# Patient Record
Sex: Female | Born: 1960 | Race: White | Hispanic: No | Smoking: Never smoker
Health system: Southern US, Community
[De-identification: ages and names within clinical notes are randomized; demographics above are authoritative.]

## PROBLEM LIST (undated history)

## (undated) DIAGNOSIS — F329 Major depressive disorder, single episode, unspecified: Secondary | ICD-10-CM

## (undated) DIAGNOSIS — D649 Anemia, unspecified: Secondary | ICD-10-CM

## (undated) DIAGNOSIS — T7840XA Allergy, unspecified, initial encounter: Secondary | ICD-10-CM

## (undated) DIAGNOSIS — C73 Malignant neoplasm of thyroid gland: Secondary | ICD-10-CM

## (undated) DIAGNOSIS — I872 Venous insufficiency (chronic) (peripheral): Secondary | ICD-10-CM

## (undated) DIAGNOSIS — K219 Gastro-esophageal reflux disease without esophagitis: Secondary | ICD-10-CM

## (undated) DIAGNOSIS — F419 Anxiety disorder, unspecified: Secondary | ICD-10-CM

## (undated) DIAGNOSIS — Z86718 Personal history of other venous thrombosis and embolism: Secondary | ICD-10-CM

## (undated) DIAGNOSIS — E039 Hypothyroidism, unspecified: Secondary | ICD-10-CM

## (undated) DIAGNOSIS — Z95828 Presence of other vascular implants and grafts: Secondary | ICD-10-CM

## (undated) DIAGNOSIS — Z8489 Family history of other specified conditions: Secondary | ICD-10-CM

## (undated) DIAGNOSIS — F32A Depression, unspecified: Secondary | ICD-10-CM

## (undated) HISTORY — DX: Allergy, unspecified, initial encounter: T78.40XA

## (undated) HISTORY — DX: Personal history of other venous thrombosis and embolism: Z86.718

## (undated) HISTORY — DX: Hypothyroidism, unspecified: E03.9

## (undated) HISTORY — DX: Presence of other vascular implants and grafts: Z95.828

## (undated) HISTORY — DX: Depression, unspecified: F32.A

## (undated) HISTORY — PX: ABDOMINAL HYSTERECTOMY: SHX81

## (undated) HISTORY — DX: Gastro-esophageal reflux disease without esophagitis: K21.9

## (undated) HISTORY — DX: Major depressive disorder, single episode, unspecified: F32.9

## (undated) HISTORY — DX: Venous insufficiency (chronic) (peripheral): I87.2

## (undated) HISTORY — PX: FETAL SURGERY FOR CONGENITAL HERNIA: SHX1618

## (undated) HISTORY — DX: Anxiety disorder, unspecified: F41.9

## (undated) HISTORY — DX: Anemia, unspecified: D64.9

## (undated) HISTORY — PX: GASTRIC BYPASS: SHX52

## (undated) HISTORY — PX: VEIN LIGATION: SHX2652

## (undated) HISTORY — DX: Malignant neoplasm of thyroid gland: C73

---
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
## 1980-12-21 HISTORY — PX: CHOLECYSTECTOMY: SHX55

## 1996-12-21 HISTORY — PX: TUBAL LIGATION: SHX77

## 2001-12-21 HISTORY — PX: BIOPSY THYROID: PRO38

## 2001-12-21 HISTORY — PX: DILATION AND CURETTAGE OF UTERUS: SHX78

## 2001-12-21 LAB — HM MAMMOGRAPHY: HM Mammogram: NORMAL

## 2002-12-21 ENCOUNTER — Encounter: Payer: Self-pay | Admitting: Internal Medicine

## 2002-12-21 LAB — CONVERTED CEMR LAB: Pap Smear: NORMAL

## 2005-09-08 ENCOUNTER — Ambulatory Visit: Payer: Self-pay | Admitting: Internal Medicine

## 2005-09-17 ENCOUNTER — Ambulatory Visit: Payer: Self-pay | Admitting: Internal Medicine

## 2005-09-17 ENCOUNTER — Ambulatory Visit: Payer: Self-pay | Admitting: *Deleted

## 2005-09-22 ENCOUNTER — Ambulatory Visit: Payer: Self-pay | Admitting: *Deleted

## 2005-10-06 ENCOUNTER — Ambulatory Visit: Payer: Self-pay | Admitting: Gastroenterology

## 2005-10-13 ENCOUNTER — Encounter (HOSPITAL_COMMUNITY): Admission: RE | Admit: 2005-10-13 | Discharge: 2005-12-18 | Payer: Self-pay | Admitting: Gastroenterology

## 2005-10-14 ENCOUNTER — Encounter (HOSPITAL_COMMUNITY): Admission: RE | Admit: 2005-10-14 | Discharge: 2006-01-12 | Payer: Self-pay | Admitting: Gastroenterology

## 2005-10-14 ENCOUNTER — Ambulatory Visit: Payer: Self-pay | Admitting: Gastroenterology

## 2005-10-19 ENCOUNTER — Ambulatory Visit: Payer: Self-pay | Admitting: Gastroenterology

## 2005-10-21 ENCOUNTER — Ambulatory Visit: Payer: Self-pay | Admitting: Gastroenterology

## 2005-10-21 ENCOUNTER — Encounter (INDEPENDENT_AMBULATORY_CARE_PROVIDER_SITE_OTHER): Payer: Self-pay | Admitting: Specialist

## 2005-10-21 DIAGNOSIS — D126 Benign neoplasm of colon, unspecified: Secondary | ICD-10-CM | POA: Insufficient documentation

## 2005-10-21 LAB — HM COLONOSCOPY

## 2005-10-28 ENCOUNTER — Ambulatory Visit: Admission: RE | Admit: 2005-10-28 | Discharge: 2005-10-28 | Payer: Self-pay | Admitting: Gynecology

## 2005-10-30 ENCOUNTER — Inpatient Hospital Stay (HOSPITAL_COMMUNITY): Admission: RE | Admit: 2005-10-30 | Discharge: 2005-11-01 | Payer: Self-pay | Admitting: Gynecology

## 2005-10-30 ENCOUNTER — Encounter (INDEPENDENT_AMBULATORY_CARE_PROVIDER_SITE_OTHER): Payer: Self-pay | Admitting: Specialist

## 2005-11-12 ENCOUNTER — Inpatient Hospital Stay (HOSPITAL_COMMUNITY): Admission: AD | Admit: 2005-11-12 | Discharge: 2005-11-14 | Payer: Self-pay | Admitting: Gynecology

## 2005-11-26 ENCOUNTER — Encounter: Admission: RE | Admit: 2005-11-26 | Discharge: 2005-11-26 | Payer: Self-pay | Admitting: General Surgery

## 2005-12-23 ENCOUNTER — Ambulatory Visit: Payer: Self-pay | Admitting: Internal Medicine

## 2006-01-04 ENCOUNTER — Ambulatory Visit (HOSPITAL_COMMUNITY): Admission: RE | Admit: 2006-01-04 | Discharge: 2006-01-05 | Payer: Self-pay | Admitting: General Surgery

## 2006-01-19 ENCOUNTER — Ambulatory Visit: Payer: Self-pay | Admitting: Internal Medicine

## 2006-02-17 ENCOUNTER — Ambulatory Visit: Payer: Self-pay | Admitting: Internal Medicine

## 2006-03-15 ENCOUNTER — Ambulatory Visit: Payer: Self-pay | Admitting: *Deleted

## 2006-03-17 ENCOUNTER — Ambulatory Visit: Payer: Self-pay | Admitting: *Deleted

## 2006-03-25 ENCOUNTER — Ambulatory Visit: Payer: Self-pay | Admitting: Cardiology

## 2006-03-29 ENCOUNTER — Ambulatory Visit: Payer: Self-pay

## 2006-04-13 ENCOUNTER — Ambulatory Visit: Payer: Self-pay | Admitting: Internal Medicine

## 2006-05-18 ENCOUNTER — Ambulatory Visit: Payer: Self-pay | Admitting: Internal Medicine

## 2006-05-20 ENCOUNTER — Ambulatory Visit: Payer: Self-pay | Admitting: Cardiology

## 2006-05-24 ENCOUNTER — Ambulatory Visit: Payer: Self-pay | Admitting: Gastroenterology

## 2006-06-02 ENCOUNTER — Ambulatory Visit: Payer: Self-pay | Admitting: Internal Medicine

## 2006-06-03 ENCOUNTER — Ambulatory Visit: Payer: Self-pay | Admitting: Cardiology

## 2006-06-10 ENCOUNTER — Ambulatory Visit: Payer: Self-pay | Admitting: Cardiology

## 2006-06-17 ENCOUNTER — Ambulatory Visit: Payer: Self-pay | Admitting: Cardiology

## 2006-06-17 ENCOUNTER — Ambulatory Visit: Payer: Self-pay | Admitting: Internal Medicine

## 2006-06-24 ENCOUNTER — Ambulatory Visit: Payer: Self-pay | Admitting: Cardiology

## 2006-06-30 ENCOUNTER — Ambulatory Visit: Payer: Self-pay | Admitting: *Deleted

## 2006-07-01 ENCOUNTER — Ambulatory Visit: Payer: Self-pay | Admitting: Cardiology

## 2006-07-08 ENCOUNTER — Ambulatory Visit: Payer: Self-pay | Admitting: Cardiology

## 2006-07-16 ENCOUNTER — Ambulatory Visit: Payer: Self-pay | Admitting: Cardiology

## 2006-07-16 ENCOUNTER — Ambulatory Visit: Payer: Self-pay | Admitting: Internal Medicine

## 2006-07-26 ENCOUNTER — Ambulatory Visit: Payer: Self-pay | Admitting: Cardiology

## 2006-08-02 ENCOUNTER — Ambulatory Visit: Payer: Self-pay | Admitting: Internal Medicine

## 2006-08-06 ENCOUNTER — Ambulatory Visit: Payer: Self-pay | Admitting: Cardiology

## 2006-08-13 ENCOUNTER — Ambulatory Visit: Payer: Self-pay | Admitting: Cardiovascular Disease

## 2006-08-16 ENCOUNTER — Ambulatory Visit: Payer: Self-pay | Admitting: Internal Medicine

## 2006-08-17 ENCOUNTER — Ambulatory Visit: Payer: Self-pay | Admitting: Cardiovascular Disease

## 2006-08-25 ENCOUNTER — Ambulatory Visit: Payer: Self-pay | Admitting: Internal Medicine

## 2006-08-27 ENCOUNTER — Ambulatory Visit: Payer: Self-pay | Admitting: Cardiology

## 2006-09-08 ENCOUNTER — Ambulatory Visit: Payer: Self-pay | Admitting: Internal Medicine

## 2006-09-10 ENCOUNTER — Ambulatory Visit: Payer: Self-pay | Admitting: Cardiology

## 2006-10-01 ENCOUNTER — Ambulatory Visit: Payer: Self-pay | Admitting: Cardiology

## 2006-10-04 ENCOUNTER — Ambulatory Visit: Payer: Self-pay | Admitting: Internal Medicine

## 2006-10-15 ENCOUNTER — Ambulatory Visit: Payer: Self-pay | Admitting: Internal Medicine

## 2006-11-05 ENCOUNTER — Ambulatory Visit: Payer: Self-pay | Admitting: Cardiovascular Disease

## 2006-12-03 ENCOUNTER — Ambulatory Visit: Payer: Self-pay | Admitting: Cardiology

## 2006-12-09 ENCOUNTER — Ambulatory Visit: Payer: Self-pay | Admitting: Internal Medicine

## 2006-12-21 HISTORY — PX: THYROIDECTOMY: SHX17

## 2006-12-23 ENCOUNTER — Ambulatory Visit: Payer: Self-pay | Admitting: Cardiology

## 2006-12-30 ENCOUNTER — Ambulatory Visit: Payer: Self-pay | Admitting: Internal Medicine

## 2007-01-13 ENCOUNTER — Ambulatory Visit: Payer: Self-pay | Admitting: Internal Medicine

## 2007-01-14 ENCOUNTER — Emergency Department (HOSPITAL_COMMUNITY): Admission: EM | Admit: 2007-01-14 | Discharge: 2007-01-14 | Payer: Self-pay | Admitting: Emergency Medicine

## 2007-01-19 ENCOUNTER — Ambulatory Visit: Payer: Self-pay | Admitting: Internal Medicine

## 2007-02-01 ENCOUNTER — Ambulatory Visit: Payer: Self-pay | Admitting: Cardiology

## 2007-02-16 ENCOUNTER — Ambulatory Visit: Payer: Self-pay | Admitting: Cardiology

## 2007-02-18 ENCOUNTER — Encounter: Payer: Self-pay | Admitting: Internal Medicine

## 2007-02-18 DIAGNOSIS — D649 Anemia, unspecified: Secondary | ICD-10-CM | POA: Insufficient documentation

## 2007-02-18 DIAGNOSIS — D509 Iron deficiency anemia, unspecified: Secondary | ICD-10-CM | POA: Insufficient documentation

## 2007-02-18 DIAGNOSIS — Z86718 Personal history of other venous thrombosis and embolism: Secondary | ICD-10-CM | POA: Insufficient documentation

## 2007-02-18 DIAGNOSIS — R32 Unspecified urinary incontinence: Secondary | ICD-10-CM | POA: Insufficient documentation

## 2007-02-18 DIAGNOSIS — G43909 Migraine, unspecified, not intractable, without status migrainosus: Secondary | ICD-10-CM | POA: Insufficient documentation

## 2007-02-18 DIAGNOSIS — K219 Gastro-esophageal reflux disease without esophagitis: Secondary | ICD-10-CM | POA: Insufficient documentation

## 2007-02-18 DIAGNOSIS — E039 Hypothyroidism, unspecified: Secondary | ICD-10-CM | POA: Insufficient documentation

## 2007-02-23 ENCOUNTER — Ambulatory Visit: Payer: Self-pay | Admitting: Internal Medicine

## 2007-03-02 ENCOUNTER — Ambulatory Visit: Payer: Self-pay | Admitting: Cardiology

## 2007-03-09 ENCOUNTER — Ambulatory Visit: Payer: Self-pay | Admitting: Internal Medicine

## 2007-03-16 ENCOUNTER — Ambulatory Visit: Payer: Self-pay | Admitting: Cardiology

## 2007-04-11 ENCOUNTER — Ambulatory Visit: Payer: Self-pay | Admitting: Internal Medicine

## 2007-04-11 LAB — CONVERTED CEMR LAB
Free T4: 0.5 ng/dL — ABNORMAL LOW (ref 0.6–1.6)
TSH: 7.67 microintl units/mL — ABNORMAL HIGH (ref 0.35–5.50)

## 2007-04-12 ENCOUNTER — Ambulatory Visit: Payer: Self-pay | Admitting: Cardiology

## 2007-05-02 ENCOUNTER — Other Ambulatory Visit: Admission: RE | Admit: 2007-05-02 | Discharge: 2007-05-02 | Payer: Self-pay | Admitting: Endocrinology

## 2007-05-02 ENCOUNTER — Ambulatory Visit: Payer: Self-pay | Admitting: Endocrinology

## 2007-05-18 ENCOUNTER — Other Ambulatory Visit: Admission: RE | Admit: 2007-05-18 | Discharge: 2007-05-18 | Payer: Self-pay | Admitting: Interventional Radiology

## 2007-05-18 ENCOUNTER — Encounter: Admission: RE | Admit: 2007-05-18 | Discharge: 2007-05-18 | Payer: Self-pay | Admitting: Endocrinology

## 2007-05-18 ENCOUNTER — Encounter (INDEPENDENT_AMBULATORY_CARE_PROVIDER_SITE_OTHER): Payer: Self-pay | Admitting: Interventional Radiology

## 2007-05-20 ENCOUNTER — Encounter (INDEPENDENT_AMBULATORY_CARE_PROVIDER_SITE_OTHER): Payer: Self-pay | Admitting: *Deleted

## 2007-05-27 ENCOUNTER — Ambulatory Visit: Payer: Self-pay | Admitting: Internal Medicine

## 2007-06-07 ENCOUNTER — Encounter: Payer: Self-pay | Admitting: Internal Medicine

## 2007-06-13 ENCOUNTER — Encounter: Admission: RE | Admit: 2007-06-13 | Discharge: 2007-06-13 | Payer: Self-pay | Admitting: Otolaryngology

## 2007-06-21 ENCOUNTER — Encounter (INDEPENDENT_AMBULATORY_CARE_PROVIDER_SITE_OTHER): Payer: Self-pay | Admitting: *Deleted

## 2007-06-27 ENCOUNTER — Ambulatory Visit: Payer: Self-pay | Admitting: Internal Medicine

## 2007-06-29 ENCOUNTER — Ambulatory Visit: Payer: Self-pay | Admitting: Cardiology

## 2007-07-04 ENCOUNTER — Ambulatory Visit: Payer: Self-pay | Admitting: Oncology

## 2007-07-18 ENCOUNTER — Ambulatory Visit: Payer: Self-pay | Admitting: Oncology

## 2007-07-19 ENCOUNTER — Encounter: Payer: Self-pay | Admitting: Internal Medicine

## 2007-07-19 LAB — CBC WITH DIFFERENTIAL (CANCER CENTER ONLY)
BASO#: 0 10*3/uL (ref 0.0–0.2)
EOS%: 2.4 % (ref 0.0–7.0)
HCT: 33.1 % — ABNORMAL LOW (ref 34.8–46.6)
HGB: 10.5 g/dL — ABNORMAL LOW (ref 11.6–15.9)
LYMPH#: 2 10*3/uL (ref 0.9–3.3)
MCHC: 31.7 g/dL — ABNORMAL LOW (ref 32.0–36.0)
MONO#: 0.3 10*3/uL (ref 0.1–0.9)
NEUT#: 3.1 10*3/uL (ref 1.5–6.5)
NEUT%: 56.2 % (ref 39.6–80.0)
RBC: 4.78 10*6/uL (ref 3.70–5.32)
WBC: 5.6 10*3/uL (ref 3.9–10.0)

## 2007-07-19 LAB — COMPREHENSIVE METABOLIC PANEL
ALT: 14 U/L (ref 0–35)
AST: 19 U/L (ref 0–37)
Albumin: 3.6 g/dL (ref 3.5–5.2)
CO2: 28 mEq/L (ref 19–32)
Calcium: 9.2 mg/dL (ref 8.4–10.5)
Chloride: 105 mEq/L (ref 96–112)
Creatinine, Ser: 0.57 mg/dL (ref 0.40–1.20)
Potassium: 4.1 mEq/L (ref 3.5–5.3)
Sodium: 140 mEq/L (ref 135–145)
Total Protein: 6.5 g/dL (ref 6.0–8.3)

## 2007-07-19 LAB — PROTIME-INR (CHCC SATELLITE): INR: 1.1 — ABNORMAL LOW (ref 2.0–3.5)

## 2007-07-25 LAB — HYPERCOAGULABLE PANEL, COMPREHENSIVE
AntiThromb III Func: 134 % — ABNORMAL HIGH (ref 76–126)
Anticardiolipin IgA: 13 [APL'U] (ref <13)
Beta-2-Glycoprotein I IgM: 4 U/mL (ref <10)
Homocysteine: 8.5 umol/L (ref 4.0–15.4)
Protein C, Total: 79 % (ref 70–140)
Protein S Activity: 89 % (ref 69–129)
Protein S Ag, Total: 109 % (ref 70–140)

## 2007-07-27 ENCOUNTER — Ambulatory Visit: Payer: Self-pay | Admitting: Internal Medicine

## 2007-08-04 ENCOUNTER — Ambulatory Visit: Payer: Self-pay | Admitting: Internal Medicine

## 2007-08-05 LAB — ANTITHROMBIN III: AntiThromb III Func: 121 % (ref 76–126)

## 2007-08-05 LAB — D-DIMER, QUANTITATIVE: D-Dimer, Quant: 0.43 ug/mL-FEU (ref 0.00–0.48)

## 2007-08-09 ENCOUNTER — Ambulatory Visit: Payer: Self-pay | Admitting: Cardiology

## 2007-08-16 ENCOUNTER — Encounter: Payer: Self-pay | Admitting: Internal Medicine

## 2007-08-17 ENCOUNTER — Encounter (INDEPENDENT_AMBULATORY_CARE_PROVIDER_SITE_OTHER): Payer: Self-pay | Admitting: Otolaryngology

## 2007-08-17 ENCOUNTER — Ambulatory Visit (HOSPITAL_COMMUNITY): Admission: RE | Admit: 2007-08-17 | Discharge: 2007-08-19 | Payer: Self-pay | Admitting: Otolaryngology

## 2007-08-17 ENCOUNTER — Ambulatory Visit: Payer: Self-pay | Admitting: Internal Medicine

## 2007-08-23 LAB — PROTIME-INR (CHCC SATELLITE): Protime: 18 Seconds — ABNORMAL HIGH (ref 10.6–13.4)

## 2007-08-24 ENCOUNTER — Encounter (INDEPENDENT_AMBULATORY_CARE_PROVIDER_SITE_OTHER): Payer: Self-pay | Admitting: *Deleted

## 2007-08-26 LAB — PROTIME-INR (CHCC SATELLITE)
INR: 2 (ref 2.0–3.5)
Protime: 24 Seconds — ABNORMAL HIGH (ref 10.6–13.4)

## 2007-08-30 LAB — PROTIME-INR (CHCC SATELLITE): Protime: 18 Seconds — ABNORMAL HIGH (ref 10.6–13.4)

## 2007-09-01 ENCOUNTER — Ambulatory Visit: Payer: Self-pay | Admitting: Oncology

## 2007-09-02 ENCOUNTER — Encounter: Payer: Self-pay | Admitting: Endocrinology

## 2007-09-02 DIAGNOSIS — F411 Generalized anxiety disorder: Secondary | ICD-10-CM | POA: Insufficient documentation

## 2007-09-02 DIAGNOSIS — F3289 Other specified depressive episodes: Secondary | ICD-10-CM | POA: Insufficient documentation

## 2007-09-02 DIAGNOSIS — J309 Allergic rhinitis, unspecified: Secondary | ICD-10-CM | POA: Insufficient documentation

## 2007-09-02 DIAGNOSIS — F329 Major depressive disorder, single episode, unspecified: Secondary | ICD-10-CM

## 2007-09-02 LAB — PROTIME-INR (CHCC SATELLITE): INR: 1.2 — ABNORMAL LOW (ref 2.0–3.5)

## 2007-09-05 ENCOUNTER — Ambulatory Visit: Payer: Self-pay | Admitting: Endocrinology

## 2007-09-05 ENCOUNTER — Encounter: Payer: Self-pay | Admitting: Endocrinology

## 2007-09-07 LAB — PROTIME-INR (CHCC SATELLITE): INR: 1.2 — ABNORMAL LOW (ref 2.0–3.5)

## 2007-09-09 ENCOUNTER — Ambulatory Visit: Payer: Self-pay | Admitting: Internal Medicine

## 2007-09-13 LAB — PROTIME-INR (CHCC SATELLITE)
INR: 2.6 (ref 2.0–3.5)
Protime: 31.2 Seconds — ABNORMAL HIGH (ref 10.6–13.4)

## 2007-09-20 LAB — PROTIME-INR (CHCC SATELLITE)
INR: 3.4 (ref 2.0–3.5)
Protime: 40.8 Seconds — ABNORMAL HIGH (ref 10.6–13.4)

## 2007-09-28 LAB — PROTIME-INR (CHCC SATELLITE): INR: 1.6 — ABNORMAL LOW (ref 2.0–3.5)

## 2007-10-03 ENCOUNTER — Ambulatory Visit: Payer: Self-pay | Admitting: Endocrinology

## 2007-10-03 LAB — CONVERTED CEMR LAB
Calcium, Total (PTH): 8.6 mg/dL (ref 8.4–10.5)
PTH: 44.9 pg/mL (ref 14.0–72.0)
TSH: 45.22 microintl units/mL — ABNORMAL HIGH (ref 0.35–5.50)

## 2007-10-04 ENCOUNTER — Encounter: Payer: Self-pay | Admitting: Internal Medicine

## 2007-10-05 LAB — PROTIME-INR (CHCC SATELLITE)

## 2007-10-09 ENCOUNTER — Encounter: Payer: Self-pay | Admitting: Endocrinology

## 2007-10-12 LAB — PROTHROMBIN TIME: Prothrombin Time: 14 seconds (ref 11.6–15.2)

## 2007-10-13 ENCOUNTER — Encounter (INDEPENDENT_AMBULATORY_CARE_PROVIDER_SITE_OTHER): Payer: Self-pay | Admitting: *Deleted

## 2007-10-13 ENCOUNTER — Ambulatory Visit: Payer: Self-pay | Admitting: Internal Medicine

## 2007-10-18 ENCOUNTER — Telehealth: Payer: Self-pay | Admitting: Endocrinology

## 2007-10-18 ENCOUNTER — Ambulatory Visit: Payer: Self-pay | Admitting: Oncology

## 2007-11-16 ENCOUNTER — Ambulatory Visit: Payer: Self-pay | Admitting: Endocrinology

## 2007-11-21 LAB — CONVERTED CEMR LAB: TSH: 2.94 microintl units/mL (ref 0.35–5.50)

## 2007-11-24 ENCOUNTER — Ambulatory Visit: Payer: Self-pay | Admitting: Internal Medicine

## 2007-11-29 ENCOUNTER — Telehealth (INDEPENDENT_AMBULATORY_CARE_PROVIDER_SITE_OTHER): Payer: Self-pay | Admitting: *Deleted

## 2007-11-30 ENCOUNTER — Encounter: Payer: Self-pay | Admitting: Internal Medicine

## 2007-11-30 LAB — CBC WITH DIFFERENTIAL (CANCER CENTER ONLY)
BASO#: 0.1 10*3/uL (ref 0.0–0.2)
Eosinophils Absolute: 0.3 10*3/uL (ref 0.0–0.5)
HCT: 29.5 % — ABNORMAL LOW (ref 34.8–46.6)
HGB: 9.5 g/dL — ABNORMAL LOW (ref 11.6–15.9)
LYMPH#: 2.2 10*3/uL (ref 0.9–3.3)
MCH: 22.6 pg — ABNORMAL LOW (ref 26.0–34.0)
MCHC: 32.1 g/dL (ref 32.0–36.0)
NEUT#: 4.5 10*3/uL (ref 1.5–6.5)
NEUT%: 60.6 % (ref 39.6–80.0)
RBC: 4.2 10*6/uL (ref 3.70–5.32)

## 2007-11-30 LAB — PROTIME-INR (CHCC SATELLITE)
INR: 4.2 — ABNORMAL HIGH (ref 2.0–3.5)
Protime: 50.4 Seconds — ABNORMAL HIGH (ref 10.6–13.4)

## 2007-12-01 LAB — IRON AND TIBC
%SAT: 5 % — ABNORMAL LOW (ref 20–55)
TIBC: 453 ug/dL (ref 250–470)

## 2007-12-01 LAB — FOLATE: Folate: 5.2 ng/mL

## 2007-12-01 LAB — VITAMIN B12: Vitamin B-12: 445 pg/mL (ref 211–911)

## 2007-12-05 ENCOUNTER — Ambulatory Visit: Payer: Self-pay | Admitting: Oncology

## 2007-12-06 ENCOUNTER — Ambulatory Visit: Payer: Self-pay | Admitting: Internal Medicine

## 2008-01-11 ENCOUNTER — Ambulatory Visit: Payer: Self-pay | Admitting: Internal Medicine

## 2008-01-16 ENCOUNTER — Ambulatory Visit: Payer: Self-pay | Admitting: Oncology

## 2008-02-13 ENCOUNTER — Encounter: Payer: Self-pay | Admitting: Internal Medicine

## 2008-02-13 LAB — CBC WITH DIFFERENTIAL (CANCER CENTER ONLY)
BASO%: 0.6 % (ref 0.0–2.0)
EOS%: 2.6 % (ref 0.0–7.0)
HCT: 38.3 % (ref 34.8–46.6)
LYMPH#: 2.4 10*3/uL (ref 0.9–3.3)
MCHC: 32.9 g/dL (ref 32.0–36.0)
MONO#: 0.3 10*3/uL (ref 0.1–0.9)
NEUT#: 3.3 10*3/uL (ref 1.5–6.5)
Platelets: 186 10*3/uL (ref 145–400)
RDW: 20.4 % — ABNORMAL HIGH (ref 10.5–14.6)
WBC: 6.2 10*3/uL (ref 3.9–10.0)

## 2008-02-13 LAB — IRON AND TIBC
%SAT: 13 % — ABNORMAL LOW (ref 20–55)
TIBC: 406 ug/dL (ref 250–470)

## 2008-02-13 LAB — RETICULOCYTES (CHCC): ABS Retic: 32 10*3/uL (ref 19.0–186.0)

## 2008-02-13 LAB — FERRITIN: Ferritin: 30 ng/mL (ref 10–291)

## 2008-02-15 ENCOUNTER — Ambulatory Visit: Payer: Self-pay | Admitting: Vascular Surgery

## 2008-02-15 ENCOUNTER — Ambulatory Visit (HOSPITAL_COMMUNITY): Admission: RE | Admit: 2008-02-15 | Discharge: 2008-02-15 | Payer: Self-pay | Admitting: Oncology

## 2008-02-15 ENCOUNTER — Encounter: Payer: Self-pay | Admitting: Oncology

## 2008-02-15 ENCOUNTER — Ambulatory Visit: Payer: Self-pay | Admitting: Internal Medicine

## 2008-03-22 ENCOUNTER — Ambulatory Visit: Payer: Self-pay | Admitting: Oncology

## 2008-03-27 ENCOUNTER — Ambulatory Visit: Payer: Self-pay | Admitting: Internal Medicine

## 2008-03-27 DIAGNOSIS — G479 Sleep disorder, unspecified: Secondary | ICD-10-CM | POA: Insufficient documentation

## 2008-03-27 LAB — CBC WITH DIFFERENTIAL (CANCER CENTER ONLY)
BASO%: 0.6 % (ref 0.0–2.0)
EOS%: 2.3 % (ref 0.0–7.0)
LYMPH%: 32.7 % (ref 14.0–48.0)
MCHC: 33.7 g/dL (ref 32.0–36.0)
MCV: 88 fL (ref 81–101)
MONO#: 0.3 10*3/uL (ref 0.1–0.9)
MONO%: 4.1 % (ref 0.0–13.0)
NEUT%: 60.3 % (ref 39.6–80.0)
Platelets: 175 10*3/uL (ref 145–400)
RDW: 15.1 % — ABNORMAL HIGH (ref 10.5–14.6)
WBC: 6.7 10*3/uL (ref 3.9–10.0)

## 2008-03-27 LAB — IRON AND TIBC
%SAT: 9 % — ABNORMAL LOW (ref 20–55)
Iron: 38 ug/dL — ABNORMAL LOW (ref 42–145)
TIBC: 404 ug/dL (ref 250–470)
UIBC: 366 ug/dL

## 2008-03-27 LAB — FERRITIN: Ferritin: 15 ng/mL (ref 10–291)

## 2008-06-15 ENCOUNTER — Ambulatory Visit: Payer: Self-pay | Admitting: Internal Medicine

## 2008-06-19 ENCOUNTER — Telehealth (INDEPENDENT_AMBULATORY_CARE_PROVIDER_SITE_OTHER): Payer: Self-pay | Admitting: *Deleted

## 2008-06-27 ENCOUNTER — Ambulatory Visit: Payer: Self-pay | Admitting: Gastroenterology

## 2008-07-10 ENCOUNTER — Ambulatory Visit (HOSPITAL_COMMUNITY): Admission: RE | Admit: 2008-07-10 | Discharge: 2008-07-10 | Payer: Self-pay | Admitting: Gastroenterology

## 2008-07-10 ENCOUNTER — Telehealth (INDEPENDENT_AMBULATORY_CARE_PROVIDER_SITE_OTHER): Payer: Self-pay | Admitting: *Deleted

## 2008-07-17 ENCOUNTER — Encounter (INDEPENDENT_AMBULATORY_CARE_PROVIDER_SITE_OTHER): Payer: Self-pay | Admitting: *Deleted

## 2008-07-18 ENCOUNTER — Ambulatory Visit: Payer: Self-pay | Admitting: Oncology

## 2008-07-18 ENCOUNTER — Telehealth: Payer: Self-pay | Admitting: Gastroenterology

## 2008-07-23 ENCOUNTER — Telehealth (INDEPENDENT_AMBULATORY_CARE_PROVIDER_SITE_OTHER): Payer: Self-pay | Admitting: *Deleted

## 2008-07-23 ENCOUNTER — Encounter: Payer: Self-pay | Admitting: Internal Medicine

## 2008-07-23 LAB — CBC WITH DIFFERENTIAL (CANCER CENTER ONLY)
Eosinophils Absolute: 0.1 10*3/uL (ref 0.0–0.5)
HCT: 40.7 % (ref 34.8–46.6)
LYMPH%: 38.4 % (ref 14.0–48.0)
MCH: 31.4 pg (ref 26.0–34.0)
MCV: 92 fL (ref 81–101)
MONO%: 4.5 % (ref 0.0–13.0)
NEUT%: 54.5 % (ref 39.6–80.0)
Platelets: 180 10*3/uL (ref 145–400)
RBC: 4.42 10*6/uL (ref 3.70–5.32)
RDW: 12.7 % (ref 10.5–14.6)

## 2008-07-23 LAB — FERRITIN: Ferritin: 37 ng/mL (ref 10–291)

## 2008-07-26 ENCOUNTER — Telehealth: Payer: Self-pay | Admitting: Gastroenterology

## 2008-08-06 ENCOUNTER — Ambulatory Visit: Payer: Self-pay | Admitting: Gastroenterology

## 2008-08-14 ENCOUNTER — Ambulatory Visit: Payer: Self-pay | Admitting: Gastroenterology

## 2008-10-10 ENCOUNTER — Ambulatory Visit: Payer: Self-pay | Admitting: Internal Medicine

## 2008-10-22 ENCOUNTER — Ambulatory Visit: Payer: Self-pay | Admitting: Oncology

## 2008-10-23 ENCOUNTER — Encounter (INDEPENDENT_AMBULATORY_CARE_PROVIDER_SITE_OTHER): Payer: Self-pay | Admitting: Orthopedic Surgery

## 2008-10-23 ENCOUNTER — Ambulatory Visit: Admission: RE | Admit: 2008-10-23 | Discharge: 2008-10-23 | Payer: Self-pay | Admitting: Orthopedic Surgery

## 2008-10-23 ENCOUNTER — Ambulatory Visit: Payer: Self-pay | Admitting: Surgery

## 2008-10-23 ENCOUNTER — Encounter: Payer: Self-pay | Admitting: Internal Medicine

## 2008-10-23 LAB — CBC WITH DIFFERENTIAL (CANCER CENTER ONLY)
BASO#: 0 10*3/uL (ref 0.0–0.2)
Eosinophils Absolute: 0.1 10*3/uL (ref 0.0–0.5)
HCT: 39.6 % (ref 34.8–46.6)
HGB: 13.5 g/dL (ref 11.6–15.9)
LYMPH%: 9.1 % — ABNORMAL LOW (ref 14.0–48.0)
MCH: 32.3 pg (ref 26.0–34.0)
MCV: 94 fL (ref 81–101)
MONO%: 0.9 % (ref 0.0–13.0)
RBC: 4.19 10*6/uL (ref 3.70–5.32)

## 2008-10-23 LAB — FERRITIN: Ferritin: 27 ng/mL (ref 10–291)

## 2008-10-23 LAB — IRON AND TIBC
Iron: 91 ug/dL (ref 42–145)
TIBC: 371 ug/dL (ref 250–470)
UIBC: 280 ug/dL

## 2008-10-29 ENCOUNTER — Encounter: Payer: Self-pay | Admitting: Internal Medicine

## 2009-01-10 ENCOUNTER — Ambulatory Visit: Payer: Self-pay | Admitting: Internal Medicine

## 2009-01-28 ENCOUNTER — Ambulatory Visit: Payer: Self-pay | Admitting: Oncology

## 2009-01-29 ENCOUNTER — Encounter: Payer: Self-pay | Admitting: Internal Medicine

## 2009-02-20 ENCOUNTER — Ambulatory Visit: Payer: Self-pay | Admitting: Internal Medicine

## 2009-03-08 ENCOUNTER — Ambulatory Visit: Payer: Self-pay | Admitting: Family Medicine

## 2009-03-29 ENCOUNTER — Telehealth: Payer: Self-pay | Admitting: Family Medicine

## 2009-05-22 ENCOUNTER — Ambulatory Visit: Payer: Self-pay | Admitting: Oncology

## 2009-06-12 ENCOUNTER — Ambulatory Visit: Payer: Self-pay | Admitting: Internal Medicine

## 2009-06-12 ENCOUNTER — Telehealth: Payer: Self-pay | Admitting: Internal Medicine

## 2009-07-08 ENCOUNTER — Ambulatory Visit: Payer: Self-pay | Admitting: Oncology

## 2009-07-10 ENCOUNTER — Encounter: Payer: Self-pay | Admitting: Internal Medicine

## 2009-07-10 LAB — IRON AND TIBC
%SAT: 16 % — ABNORMAL LOW (ref 20–55)
TIBC: 363 ug/dL (ref 250–470)

## 2009-07-10 LAB — CBC WITH DIFFERENTIAL (CANCER CENTER ONLY)
BASO#: 0 10*3/uL (ref 0.0–0.2)
Eosinophils Absolute: 0.2 10*3/uL (ref 0.0–0.5)
HGB: 12.5 g/dL (ref 11.6–15.9)
LYMPH#: 2.4 10*3/uL (ref 0.9–3.3)
MCH: 30.8 pg (ref 26.0–34.0)
MONO#: 0.4 10*3/uL (ref 0.1–0.9)
NEUT#: 2.7 10*3/uL (ref 1.5–6.5)
RBC: 4.07 10*6/uL (ref 3.70–5.32)
WBC: 5.6 10*3/uL (ref 3.9–10.0)

## 2009-07-10 LAB — FERRITIN: Ferritin: 8 ng/mL — ABNORMAL LOW (ref 10–291)

## 2009-07-12 ENCOUNTER — Ambulatory Visit: Payer: Self-pay | Admitting: Internal Medicine

## 2009-08-07 ENCOUNTER — Ambulatory Visit: Payer: Self-pay | Admitting: Oncology

## 2009-08-27 ENCOUNTER — Ambulatory Visit: Payer: Self-pay | Admitting: Internal Medicine

## 2009-08-27 DIAGNOSIS — I872 Venous insufficiency (chronic) (peripheral): Secondary | ICD-10-CM | POA: Insufficient documentation

## 2009-08-28 LAB — CONVERTED CEMR LAB
ALT: 23 units/L (ref 0–35)
AST: 23 units/L (ref 0–37)
Albumin: 3.9 g/dL (ref 3.5–5.2)
Alkaline Phosphatase: 94 units/L (ref 39–117)
BUN: 6 mg/dL (ref 6–23)
Bilirubin, Direct: 0.2 mg/dL (ref 0.0–0.3)
CO2: 29 meq/L (ref 19–32)
Calcium: 8.7 mg/dL (ref 8.4–10.5)
Chloride: 106 meq/L (ref 96–112)
Creatinine, Ser: 0.5 mg/dL (ref 0.4–1.2)
Free T4: 1.3 ng/dL (ref 0.6–1.6)
GFR calc non Af Amer: 139.8 mL/min (ref >60)
Glucose, Bld: 85 mg/dL (ref 70–99)
Potassium: 3.6 meq/L (ref 3.5–5.1)
Sodium: 144 meq/L (ref 135–145)
TSH: 0.04 microintl units/mL — ABNORMAL LOW (ref 0.35–5.50)
Total Bilirubin: 1.5 mg/dL — ABNORMAL HIGH (ref 0.3–1.2)
Total Protein: 6.6 g/dL (ref 6.0–8.3)

## 2009-09-04 ENCOUNTER — Ambulatory Visit: Payer: Self-pay | Admitting: Oncology

## 2009-09-24 ENCOUNTER — Emergency Department (HOSPITAL_COMMUNITY): Admission: EM | Admit: 2009-09-24 | Discharge: 2009-09-24 | Payer: Self-pay | Admitting: Emergency Medicine

## 2009-09-24 ENCOUNTER — Emergency Department (HOSPITAL_COMMUNITY): Admission: EM | Admit: 2009-09-24 | Discharge: 2009-09-24 | Payer: Self-pay | Admitting: Family Medicine

## 2009-09-24 ENCOUNTER — Encounter: Payer: Self-pay | Admitting: Internal Medicine

## 2009-09-25 ENCOUNTER — Telehealth: Payer: Self-pay | Admitting: Internal Medicine

## 2009-09-25 DIAGNOSIS — Z8669 Personal history of other diseases of the nervous system and sense organs: Secondary | ICD-10-CM | POA: Insufficient documentation

## 2009-09-25 DIAGNOSIS — Q054 Unspecified spina bifida with hydrocephalus: Secondary | ICD-10-CM

## 2009-10-02 ENCOUNTER — Encounter: Admission: RE | Admit: 2009-10-02 | Discharge: 2009-10-02 | Payer: Self-pay | Admitting: Internal Medicine

## 2009-10-11 ENCOUNTER — Encounter: Payer: Self-pay | Admitting: Internal Medicine

## 2009-10-30 ENCOUNTER — Ambulatory Visit: Payer: Self-pay | Admitting: Oncology

## 2009-11-04 ENCOUNTER — Encounter: Payer: Self-pay | Admitting: Internal Medicine

## 2009-11-04 LAB — CBC WITH DIFFERENTIAL (CANCER CENTER ONLY)
BASO#: 0 10*3/uL (ref 0.0–0.2)
BASO%: 0.6 % (ref 0.0–2.0)
EOS%: 2.3 % (ref 0.0–7.0)
Eosinophils Absolute: 0.2 10*3/uL (ref 0.0–0.5)
HCT: 39.3 % (ref 34.8–46.6)
HGB: 13.1 g/dL (ref 11.6–15.9)
LYMPH#: 2.5 10*3/uL (ref 0.9–3.3)
LYMPH%: 36.1 % (ref 14.0–48.0)
MCH: 32 pg (ref 26.0–34.0)
MCHC: 33.3 g/dL (ref 32.0–36.0)
MCV: 96 fL (ref 81–101)
MONO#: 0.3 10*3/uL (ref 0.1–0.9)
MONO%: 4 % (ref 0.0–13.0)
NEUT#: 4 10*3/uL (ref 1.5–6.5)
NEUT%: 57 % (ref 39.6–80.0)
Platelets: 161 10*3/uL (ref 145–400)
RBC: 4.08 10*6/uL (ref 3.70–5.32)
RDW: 11.8 % (ref 10.5–14.6)
WBC: 6.9 10*3/uL (ref 3.9–10.0)

## 2009-11-05 LAB — IRON AND TIBC
%SAT: 20 % (ref 20–55)
Iron: 68 ug/dL (ref 42–145)
TIBC: 342 ug/dL (ref 250–470)

## 2009-11-13 ENCOUNTER — Ambulatory Visit: Payer: Self-pay | Admitting: Cardiovascular Disease

## 2009-12-04 ENCOUNTER — Ambulatory Visit (HOSPITAL_COMMUNITY): Admission: RE | Admit: 2009-12-04 | Discharge: 2009-12-04 | Payer: Self-pay | Admitting: Obstetrics

## 2009-12-04 ENCOUNTER — Encounter: Payer: Self-pay | Admitting: Cardiovascular Disease

## 2009-12-04 ENCOUNTER — Ambulatory Visit: Payer: Self-pay | Admitting: Internal Medicine

## 2009-12-04 ENCOUNTER — Ambulatory Visit: Payer: Self-pay

## 2009-12-12 ENCOUNTER — Telehealth: Payer: Self-pay | Admitting: Cardiovascular Disease

## 2009-12-19 ENCOUNTER — Ambulatory Visit: Payer: Self-pay | Admitting: Family Medicine

## 2010-01-21 ENCOUNTER — Ambulatory Visit: Payer: Self-pay | Admitting: Internal Medicine

## 2010-01-23 ENCOUNTER — Encounter (INDEPENDENT_AMBULATORY_CARE_PROVIDER_SITE_OTHER): Payer: Self-pay | Admitting: *Deleted

## 2010-01-31 ENCOUNTER — Ambulatory Visit: Payer: Self-pay | Admitting: Internal Medicine

## 2010-02-11 ENCOUNTER — Encounter: Payer: Self-pay | Admitting: Internal Medicine

## 2010-02-28 ENCOUNTER — Ambulatory Visit: Payer: Self-pay | Admitting: Oncology

## 2010-03-04 ENCOUNTER — Encounter: Payer: Self-pay | Admitting: Internal Medicine

## 2010-03-04 LAB — IRON AND TIBC
Iron: 101 ug/dL (ref 42–145)
UIBC: 257 ug/dL

## 2010-03-04 LAB — CBC WITH DIFFERENTIAL (CANCER CENTER ONLY)
BASO%: 0.7 % (ref 0.0–2.0)
EOS%: 2.1 % (ref 0.0–7.0)
MCH: 32.5 pg (ref 26.0–34.0)
MCHC: 34 g/dL (ref 32.0–36.0)
MONO%: 4 % (ref 0.0–13.0)
NEUT#: 4.5 10*3/uL (ref 1.5–6.5)
Platelets: 199 10*3/uL (ref 145–400)
RDW: 12.4 % (ref 10.5–14.6)

## 2010-03-27 ENCOUNTER — Encounter: Payer: Self-pay | Admitting: Internal Medicine

## 2010-03-27 ENCOUNTER — Ambulatory Visit: Payer: Self-pay | Admitting: Vascular Surgery

## 2010-03-27 ENCOUNTER — Ambulatory Visit (HOSPITAL_COMMUNITY): Admission: RE | Admit: 2010-03-27 | Discharge: 2010-03-27 | Payer: Self-pay | Admitting: Internal Medicine

## 2010-05-21 HISTORY — PX: CRANIECTOMY SUBOCCIPITAL W/ CERVICAL LAMINECTOMY / CHIARI: SUR327

## 2010-05-26 ENCOUNTER — Ambulatory Visit: Payer: Self-pay | Admitting: Oncology

## 2010-05-29 ENCOUNTER — Ambulatory Visit: Payer: Self-pay | Admitting: Internal Medicine

## 2010-05-29 LAB — CBC WITH DIFFERENTIAL (CANCER CENTER ONLY)
BASO%: 0.3 % (ref 0.0–2.0)
EOS%: 2.4 % (ref 0.0–7.0)
LYMPH#: 2 10*3/uL (ref 0.9–3.3)
MCHC: 34.2 g/dL (ref 32.0–36.0)
NEUT#: 3.3 10*3/uL (ref 1.5–6.5)
Platelets: 182 10*3/uL (ref 145–400)
RDW: 11 % (ref 10.5–14.6)

## 2010-06-02 ENCOUNTER — Inpatient Hospital Stay (HOSPITAL_COMMUNITY): Admission: RE | Admit: 2010-06-02 | Discharge: 2010-06-06 | Payer: Self-pay | Admitting: Neurosurgery

## 2010-06-16 ENCOUNTER — Encounter: Payer: Self-pay | Admitting: Internal Medicine

## 2010-06-16 LAB — CBC WITH DIFFERENTIAL (CANCER CENTER ONLY)
BASO#: 0 10*3/uL (ref 0.0–0.2)
EOS%: 5.1 % (ref 0.0–7.0)
Eosinophils Absolute: 0.3 10*3/uL (ref 0.0–0.5)
HGB: 12.7 g/dL (ref 11.6–15.9)
LYMPH#: 1.6 10*3/uL (ref 0.9–3.3)
NEUT#: 3.1 10*3/uL (ref 1.5–6.5)
RBC: 3.86 10*6/uL (ref 3.70–5.32)

## 2010-06-16 LAB — IRON AND TIBC
%SAT: 25 % (ref 20–55)
Iron: 78 ug/dL (ref 42–145)
UIBC: 231 ug/dL

## 2010-06-16 LAB — FERRITIN: Ferritin: 71 ng/mL (ref 10–291)

## 2010-07-09 ENCOUNTER — Ambulatory Visit: Payer: Self-pay | Admitting: Internal Medicine

## 2010-07-11 ENCOUNTER — Ambulatory Visit: Payer: Self-pay | Admitting: Oncology

## 2010-07-25 ENCOUNTER — Encounter: Payer: Self-pay | Admitting: Internal Medicine

## 2010-07-25 LAB — CBC WITH DIFFERENTIAL/PLATELET
Basophils Absolute: 0 10*3/uL (ref 0.0–0.1)
EOS%: 2.6 % (ref 0.0–7.0)
Eosinophils Absolute: 0.1 10*3/uL (ref 0.0–0.5)
HGB: 12.2 g/dL (ref 11.6–15.9)
MONO#: 0.4 10*3/uL (ref 0.1–0.9)
NEUT#: 2.4 10*3/uL (ref 1.5–6.5)
RDW: 13.8 % (ref 11.2–14.5)
WBC: 4.8 10*3/uL (ref 3.9–10.3)
lymph#: 1.9 10*3/uL (ref 0.9–3.3)

## 2010-07-28 ENCOUNTER — Encounter: Payer: Self-pay | Admitting: Oncology

## 2010-07-28 ENCOUNTER — Ambulatory Visit: Payer: Self-pay | Admitting: Vascular Surgery

## 2010-07-28 ENCOUNTER — Ambulatory Visit: Admission: RE | Admit: 2010-07-28 | Discharge: 2010-07-28 | Payer: Self-pay | Admitting: Oncology

## 2010-08-04 ENCOUNTER — Ambulatory Visit (HOSPITAL_COMMUNITY): Admission: RE | Admit: 2010-08-04 | Discharge: 2010-08-04 | Payer: Self-pay | Admitting: Neurosurgery

## 2010-08-11 ENCOUNTER — Inpatient Hospital Stay (HOSPITAL_COMMUNITY): Admission: RE | Admit: 2010-08-11 | Discharge: 2010-08-17 | Payer: Self-pay | Admitting: Neurosurgery

## 2010-08-14 ENCOUNTER — Ambulatory Visit: Payer: Self-pay | Admitting: Oncology

## 2010-08-20 ENCOUNTER — Encounter: Payer: Self-pay | Admitting: Internal Medicine

## 2010-08-20 LAB — CBC WITH DIFFERENTIAL (CANCER CENTER ONLY)
BASO#: 0.1 10*3/uL (ref 0.0–0.2)
Eosinophils Absolute: 0.2 10*3/uL (ref 0.0–0.5)
HGB: 13.6 g/dL (ref 11.6–15.9)
MCH: 32.7 pg (ref 26.0–34.0)
MCV: 96 fL (ref 81–101)
MONO%: 7.1 % (ref 0.0–13.0)
NEUT#: 6 10*3/uL (ref 1.5–6.5)
RBC: 4.16 10*6/uL (ref 3.70–5.32)

## 2010-08-20 LAB — CMP (CANCER CENTER ONLY)
Albumin: 3.7 g/dL (ref 3.3–5.5)
Alkaline Phosphatase: 68 U/L (ref 26–84)
BUN, Bld: 9 mg/dL (ref 7–22)
Calcium: 8.7 mg/dL (ref 8.0–10.3)
Chloride: 97 mEq/L — ABNORMAL LOW (ref 98–108)
Glucose, Bld: 87 mg/dL (ref 73–118)
Potassium: 3.9 mEq/L (ref 3.3–4.7)

## 2010-08-20 LAB — PROTIME-INR (CHCC SATELLITE)
INR: 0.9 — ABNORMAL LOW (ref 2.0–3.5)
Protime: 10.8 Seconds (ref 10.6–13.4)

## 2010-09-11 ENCOUNTER — Encounter: Payer: Self-pay | Admitting: Internal Medicine

## 2010-10-02 ENCOUNTER — Telehealth: Payer: Self-pay | Admitting: Internal Medicine

## 2010-10-03 ENCOUNTER — Ambulatory Visit: Payer: Self-pay | Admitting: Internal Medicine

## 2010-10-17 ENCOUNTER — Telehealth: Payer: Self-pay | Admitting: Internal Medicine

## 2010-11-27 ENCOUNTER — Ambulatory Visit: Payer: Self-pay | Admitting: Oncology

## 2010-12-01 LAB — IRON AND TIBC: %SAT: 13 % — ABNORMAL LOW (ref 20–55)

## 2010-12-01 LAB — CBC WITH DIFFERENTIAL/PLATELET
BASO%: 0.4 % (ref 0.0–2.0)
HCT: 37.7 % (ref 34.8–46.6)
LYMPH%: 33.5 % (ref 14.0–49.7)
MCHC: 34.1 g/dL (ref 31.5–36.0)
MONO#: 0.3 10*3/uL (ref 0.1–0.9)
NEUT%: 58.4 % (ref 38.4–76.8)
Platelets: 199 10*3/uL (ref 145–400)
WBC: 6.1 10*3/uL (ref 3.9–10.3)

## 2010-12-01 LAB — FERRITIN: Ferritin: 30 ng/mL (ref 10–291)

## 2010-12-05 ENCOUNTER — Ambulatory Visit: Payer: Self-pay | Admitting: Internal Medicine

## 2010-12-22 ENCOUNTER — Emergency Department (HOSPITAL_COMMUNITY)
Admission: EM | Admit: 2010-12-22 | Discharge: 2010-12-22 | Payer: Self-pay | Source: Home / Self Care | Admitting: Family Medicine

## 2010-12-23 ENCOUNTER — Encounter (INDEPENDENT_AMBULATORY_CARE_PROVIDER_SITE_OTHER): Payer: Self-pay | Admitting: *Deleted

## 2010-12-23 ENCOUNTER — Telehealth: Payer: Self-pay | Admitting: Family Medicine

## 2010-12-24 ENCOUNTER — Encounter: Payer: Self-pay | Admitting: Family Medicine

## 2010-12-24 ENCOUNTER — Ambulatory Visit
Admission: RE | Admit: 2010-12-24 | Discharge: 2010-12-24 | Payer: Self-pay | Source: Home / Self Care | Attending: Family Medicine | Admitting: Family Medicine

## 2011-01-01 ENCOUNTER — Emergency Department (HOSPITAL_COMMUNITY)
Admission: EM | Admit: 2011-01-01 | Discharge: 2011-01-01 | Payer: Self-pay | Source: Home / Self Care | Admitting: Family Medicine

## 2011-01-06 ENCOUNTER — Ambulatory Visit
Admission: RE | Admit: 2011-01-06 | Discharge: 2011-01-06 | Payer: Self-pay | Source: Home / Self Care | Attending: Internal Medicine | Admitting: Internal Medicine

## 2011-01-06 ENCOUNTER — Ambulatory Visit: Payer: Self-pay | Admitting: Oncology

## 2011-01-11 ENCOUNTER — Encounter: Payer: Self-pay | Admitting: Endocrinology

## 2011-01-12 ENCOUNTER — Encounter: Payer: Self-pay | Admitting: Gastroenterology

## 2011-01-12 ENCOUNTER — Encounter: Payer: Self-pay | Admitting: Internal Medicine

## 2011-01-20 NOTE — Letter (Signed)
Summary: Appointment - Missed  [facility], Main Office  [address]   [place], [place] [address]   Phone: [phone]  Fax: [phone]     January 23, 2010 MRN: [id number]   [name] [address] [place], [place]  [address]   Dear Ms. [name],  Our records indicate you missed your appointment on December 25, 2009 with Dr. Clifton James. It is very important that we reach you to reschedule this appointment. We look forward to participating in your health care needs. Please contact us at the number listed above at your earliest convenience to reschedule this appointment.     Sincerely, Neurosurgeon Team LG

## 2011-01-20 NOTE — Consult Note (Signed)
Summary: [facility]  [facility]   Imported By: Lanelle Bal 03/26/2010 11:36:28  _____________________________________________________________________  External Attachment:    Type:   Image     Comment:   External Document  Appended Document: [facility] bad allergies considering immunotherapy may have latex and hymenoptera sensitivity also

## 2011-01-20 NOTE — Letter (Signed)
[facility]   Imported By: Lester Ettrick 06/07/2010 08:26:03  _____________________________________________________________________  External Attachment:    Type:   Image     Comment:   External Document  Appended Document: [facility] preop eval for Chiari malformation repair recommended sequential compression hose, then lovenox and coumadin for 1-3 months post op

## 2011-01-20 NOTE — Letter (Signed)
[facility]   Imported By: Maryln Gottron 08/05/2010 12:21:47  _____________________________________________________________________  External Attachment:    Type:   Image     Comment:   External Document  Appended Document: [facility] HGB normal no iron needed  off lovenox post op mild calf swelling---checking duplex

## 2011-01-20 NOTE — Progress Notes (Signed)
Summary: requests zoloft  Phone Note Call from Patient Call back at Home Phone [phone]   Caller: Patient Call For:  R Summary of Call: Pt states she was on zoloft a few years ago and recently started taking this again.  She says she has been under a lot of stress and is asking if a new script can be called in for [facility] to [facility].  She was on 100 mg's back then but says she doesnt think she needs that high of a dose. Initial call taken by: Lowella Petties CMA, AAMA,  October 17, 2010 8:27 AM  Follow-up for Phone Call        okay to send Rx for sertraline 100mg   1/2 daily and increase as directed #30 x 1  have her set up appt in about 3 weeks to see how she is doing back on this Follow-up by: Cindee Salt MD,  October 17, 2010 8:54 AM  Additional Follow-up for Phone Call Additional follow up Details #1::        [facility], patient will call back to set up appt. Additional Follow-up by: Mervin Hack CMA Duncan Dull),  October 17, 2010 12:04 PM    New/Updated Medications: SERTRALINE HCL 100 MG TABS (SERTRALINE HCL) 1/2 daily and increase as directed Prescriptions: SERTRALINE HCL 100 MG TABS (SERTRALINE HCL) 1/2 daily and increase as directed  #30 x 1   Entered by:   Mervin Hack CMA (AAMA)   Authorized by:   Cindee Salt MD   Signed by:   Mervin Hack CMA (AAMA) on 10/17/2010   Method used:   Telephoned to ...       [facility] [facility]ail)       [address]       [place], [place]  [address]       Ph: [phone] or [phone]       Fax: [phone]   RxID:   [id number]

## 2011-01-20 NOTE — Letter (Signed)
[facility]   Imported By: Lanelle Bal 07/02/2010 12:55:57  _____________________________________________________________________  External Attachment:    Type:   Image     Comment:   External Document  Appended Document: [facility] lovenox for 1 month postop then switching to coumadin Chiari malformation repaired

## 2011-01-20 NOTE — Assessment & Plan Note (Signed)
Summary: RASH x1week/ds   Vital Signs:  Patient profile:   50 year old female Weight:      185 pounds BMI:     32.89 Temp:     98.3 degrees F oral BP sitting:   112 / 68  (left arm) Cuff size:   large  Vitals Entered By: Mervin Hack CMA Duncan Dull) (October 03, 2010 3:41 PM) CC: rash   History of Present Illness:  Concerned about her left great toe No obvious trauma that she remembers started 1-2 weeks ago now purplish under the nail tender in toe but not overly painful some discomfort when "confined" in shoe  also has noted irritated area with bleeding in groin--suprapubic has sig pannus from her past gastric bypass and weight loss tried desitin and powder feminine wash also    Allergies: 1)  ! Ambien (Zolpidem Tartrate) 2)  ! * Latex 3)  Rozerem (Ramelteon) 4)  * Tetracycline Analogues Group 5)  Temazepam (Temazepam)  Past History:  Past medical, surgical, family and social histories (including risk factors) reviewed for relevance to current acute and chronic problems.  Past Medical History: Reviewed history from 01/31/2010 and no changes required. Anemia-------------------------------------------------------Dr Cherene Altes DVT, hx of GERD----------------------------------------------------------Dr Christella Hartigan Hypothyroidism Allergic rhinitis Anxiety Depression Thyroid nodules--path okay at surgery Chronic venous insufficiency  Past Surgical History: Caesarean section--1998 Cholecystectomy--1982 Hysterectomy Tubal ligation--1998 Tonsillectomy--1965 [place] filter--7/04 Vein ligation R leg--1995/1998 D&C--2003 thyroid biopsy-1/03---suggestive of Hashimoto's Gastric bypass (L) Thyroidectomy--2008 Hernia Surgery 6/11 Repair of Chiari malformation--Dr [facility] 8/11 repair of myelocele but Dr Jordan Likes  Family History: Reviewed history from 11/13/2009 and no changes required. Family History of Arthritis--Mom Family History of CAD Female 1st degree relative  <50---Sister, Dad Family History Diabetes 1st degree relative Family History Ovarian cancer--Mat GM Family History Hypertension--multiple  Father-alive, CAD Mother-alive Siblings-alive, healthy  Social History: Reviewed history from 07/09/2010 and no changes required. 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 1 at onset of migraine headache as needed 2)  Lorazepam 0.5 Mg Tabs (Lorazepam) .... Take 1 tablet by mouth two times a day as needed 3)  Synthroid 125 Mcg Tabs (Levothyroxine sodium) .... Take one by mouth daily 4)  Cyclobenzaprine Hcl 5 Mg Tabs (Cyclobenzaprine hcl) .Marland Kitchen.. 1-2 at bedtime for muscle pain as needed 5)  Qc Daily Multivitamins/iron Tabs (Multiple vitamins-iron) .... Take 1 by mouth once daily 6)  Epipen 0.3 Mg/0.64ml Devi (Epinephrine) .... Use as directed for severe allergic reaction 7)  Prevacid 30 Mg Cpdr (Lansoprazole) .Marland KitchenMarland KitchenMarland Kitchen  Take 1 tablet by mouth three times a day 8)   Vitamin B-12 Cr 1000 Mcg Tbcr (Cyanocobalamin) .... Every month 9)  Miralax Powd (Polyethylene glycol 3350) .... As needed 10)  Ketoconazole 2 % Crea (Ketoconazole) .... Apply to rash under skin fold three times a day as needed for rash  Other Orders: Vit B12 1000 mcg (Z6109) Admin of Therapeutic Inj  intramuscular or subcutaneous (60454)  Patient Instructions: 1)  Please keep appt on January 17th Prescriptions: KETOCONAZOLE 2 % CREA (KETOCONAZOLE) apply to rash under skin fold three times a day as needed for rash  #60gm x 1   Entered and Authorized by:   Cindee Salt MD   Signed by:   Cindee Salt MD on 10/03/2010   Method used:   [facility]        [facility] (retail)       [address]       [place], [place]  [address]       Ph: [phone] or [phone]       Fax: [phone]   RxID:   [id number]   Current Allergies (reviewed today): ! AMBIEN (ZOLPIDEM TARTRATE) ! * LATEX ROZEREM (RAMELTEON) * TETRACYCLINE ANALOGUES GROUP TEMAZEPAM (TEMAZEPAM)   Influenza Immunization History:    Influenza # 1:  Historical (09/20/2010)  Pneumovax Immunization History:    Pneumovax # 1:  Historical (07/21/2010)    Medication Administration  Injection # 1:    Medication: Vit B12 1000 mcg    Diagnosis: B12 DEFICIENCY (ICD-266.2)    Route: IM    Site: L deltoid    Exp Date: 06/20/2012    Lot #: [id number]    Mfr: [employer]    Patient tolerated injection without complications    Given by: Mervin Hack CMA Duncan Dull) (October 03, 2010 3:47 PM)  Orders Added: 1)  Vit B12 1000 mcg [J3420] 2)  Admin of Therapeutic Inj  intramuscular or subcutaneous [96372] 3)  Est. Patient Level III [56387]

## 2011-01-20 NOTE — Letter (Signed)
[facility]   Imported By: Lanelle Bal 03/18/2010 13:44:23  _____________________________________________________________________  External Attachment:    Type:   Image     Comment:   External Document  Appended Document: [facility] will see again in June before repair of Chiari malformation

## 2011-01-20 NOTE — Assessment & Plan Note (Signed)
Summary: ACUTE- ALLEG REACTION CYD   Vital Signs:  Patient profile:   50 year old female Weight:      190 pounds Temp:     97.9 degrees F oral BP sitting:   108 / 60  (right arm) Cuff size:   large  Vitals Entered By: Mervin Hack CMA Duncan Dull) (January 31, 2010 9:05 AM) CC: allergic reaction   History of Present Illness: Went into office at work Noticed tingle and swelling of lips Popped some benedryl and it has helped a little  No cough did feel slightly "fluttery" No wheezing  No tongue swelling No swallowing problems  Allergies: 1)  ! Ambien (Zolpidem Tartrate) 2)  ! * Latex 3)  Rozerem (Ramelteon) 4)  * Tetracycline Analogues Group 5)  Temazepam (Temazepam)  Past History:  Past medical, surgical, family and social histories (including risk factors) reviewed for relevance to current acute and chronic problems.  Past Medical History: Anemia-------------------------------------------------------Dr Cherene Altes DVT, hx of GERD----------------------------------------------------------Dr Christella Hartigan Hypothyroidism Allergic rhinitis Anxiety Depression Thyroid nodules--path okay at surgery Chronic venous insufficiency  Past Surgical History: Reviewed history from 07/12/2009 and no changes required. Caesarean section--1998 Cholecystectomy--1982 Hysterectomy Tubal ligation--1998 Tonsillectomy--1965 [place] filter--7/04 Vein ligation R leg--1995/1998 D&C--2003 thyroid biopsy-1/03---suggestive of Hashimoto's Gastric bypass (L) Thyroidectomy--2008 Hernia Surgery  Family History: Reviewed history from 11/13/2009 and no changes required. Family History of Arthritis--Mom Family History of CAD Female 1st degree relative <50---Sister, Dad Family History Diabetes 1st degree relative Family History Ovarian cancer--Mat GM Family History Hypertension--multiple  Father-alive, CAD Mother-alive Siblings-alive, healthy  Social History: Reviewed history from 11/13/2009  and no changes required. 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   Mouth:  mild lip swelling mouth is normal throat normal Neck:  supple and no masses.   Lungs:  normal respiratory effort, normal breath sounds, and no wheezes.     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 Take 1 tablet by mouth three times a day 2)  Vitamin B-12 Cr 1000 Mcg Tbcr (Cyanocobalamin) .... Every month 3)  Miralax Powd (Polyethylene glycol 3350) .... As needed 4)  Allegra 180 Mg Tabs (Fexofenadine hcl) .... Take 1 tablet by mouth once a day 5)  Relpax 40 Mg Tabs (Eletriptan hydrobromide) .Marland Kitchen.. 1 at onset of migraine headache as needed 6)  Lorazepam 0.5 Mg Tabs (Lorazepam) .... Take 1 tablet by mouth two times a day as needed 7)  Synthroid 125 Mcg Tabs (Levothyroxine sodium) .... Take one by mouth daily 8)  Cyclobenzaprine Hcl 5 Mg Tabs (Cyclobenzaprine hcl) .Marland Kitchen.. 1-2 at bedtime for muscle pain as needed 9)  Qc Daily Multivitamins/iron Tabs (Multiple vitamins-iron) .... Take 1 by mouth once daily 10)  Epipen 0.3 Mg/0.80ml (1:1000) Devi (Epinephrine hcl (anaphylaxis)) .... As needed 11)  Hydrocodone-acetaminophen 5-325 Mg Tabs (Hydrocodone-acetaminophen) .Marland Kitchen.. 1 by mouth up to 4 times per day as needed for pain 12)  Prednisone 20 Mg Tabs (Prednisone) .... 2 tabs daily for 5 days for allergic reaction 13)  Epipen 0.3 Mg/0.44ml Devi (Epinephrine) .Marland KitchenMarland KitchenMarland Kitchen  Use as directed for severe allergic  reaction  Patient Instructions: 1)  Keep regular appt 2)  Referral Appointment Information 3)  Day/Date: 4)  Time: 5)  Place/MD: 6)  Address: 7)  Phone/Fax: 8)  Patient given appointment information. Information/Orders faxed/mailed.  Current Allergies (reviewed today): ! AMBIEN (ZOLPIDEM TARTRATE) ! * LATEX ROZEREM (RAMELTEON) * TETRACYCLINE ANALOGUES GROUP TEMAZEPAM (TEMAZEPAM)

## 2011-01-20 NOTE — Letter (Signed)
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

## 2011-01-20 NOTE — Assessment & Plan Note (Signed)
Summary: B12/DLO   Nurse Visit   Allergies: 1)  ! Ambien (Zolpidem Tartrate) 2)  ! * Latex 3)  Rozerem (Ramelteon) 4)  * Tetracycline Analogues Group 5)  Temazepam (Temazepam)  Medication Administration  Injection # 1:    Medication: Vit B12 1000 mcg    Diagnosis: B12 DEFICIENCY (ICD-266.2)    Route: IM    Site: L deltoid    Exp Date: 04/21/2011    Lot #: [id number]    Mfr: [employer]    Patient tolerated injection without complications    Given by: Mervin Hack CMA (AAMA) (May 29, 2010 3:00 PM)  Orders Added: 1)  Vit B12 1000 mcg [J3420] 2)  Admin of Therapeutic Inj  intramuscular or subcutaneous [96372]   Medication Administration  Injection # 1:    Medication: Vit B12 1000 mcg    Diagnosis: B12 DEFICIENCY (ICD-266.2)    Route: IM    Site: L deltoid    Exp Date: 04/21/2011    Lot #: [id number]    Mfr: [employer]    Patient tolerated injection without complications    Given by: Mervin Hack CMA (AAMA) (May 29, 2010 3:00 PM)  Orders Added: 1)  Vit B12 1000 mcg [J3420] 2)  Admin of Therapeutic Inj  intramuscular or subcutaneous [11914]

## 2011-01-20 NOTE — Assessment & Plan Note (Signed)
Summary: check arm/B-12/ds   Vital Signs:  Patient profile:   50 year old female Weight:      179.8 pounds Temp:     98.4 degrees F oral Pulse rate:   60 / minute Pulse rhythm:   regular BP sitting:   110 / 80  (left arm) Cuff size:   large  Vitals Entered By: Sydell Axon LPN (July 09, 2010 12:23 PM) CC: Check rash on right side of face, check right arm and redness on right leg   History of Present Illness: Had repair of Chiari malformation in June seems to be recovering well Hit in forehead by basketball 7/15 seems to be fine from that  Vision is off some since the surgery--just blurry Due for repeat eye exam Feels slightly "clumsy"  Concerned about her right arm, and also leg Noted indented area below shoulder and some redness also on right thigh No sig pain Some weakness but nothing different---relates some weakness since gastric bypass  Developed swollen red area around right mouth and chin pain along mandible no fever but felt warm no pain with chewing Has been improving without Rx did take some left over penicillin  Allergies: 1)  ! Ambien (Zolpidem Tartrate) 2)  ! * Latex 3)  Rozerem (Ramelteon) 4)  * Tetracycline Analogues Group 5)  Temazepam (Temazepam)  Past History:  Past medical, surgical, family and social histories (including risk factors) reviewed for relevance to current acute and chronic problems.  Past Medical History: Reviewed history from 01/31/2010 and no changes required. Anemia-------------------------------------------------------Dr Cherene Altes DVT, hx of GERD----------------------------------------------------------Dr Christella Hartigan Hypothyroidism Allergic rhinitis Anxiety Depression Thyroid nodules--path okay at surgery Chronic venous insufficiency  Past Surgical History: Caesarean section--1998 Cholecystectomy--1982 Hysterectomy Tubal ligation--1998 Tonsillectomy--1965 [place] filter--7/04 Vein ligation R  leg--1995/1998 D&C--2003 thyroid biopsy-1/03---suggestive of Hashimoto's Gastric bypass (L) Thyroidectomy--2008 Hernia Surgery 6/11 Repair of Chiari malformation--Dr Pool  Family History: Reviewed history from 11/13/2009 and no changes required. 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   Mouth:  poor dentition.  Slight injection along gingiva Neck:  supple and no masses.   Msk:  small arms but still with defined biceps and triceps Has apparent indentation just lateral to upper biceps on right. Not tender  non specific redness in circle along lateral right calf No muscle changes Skin:  slight redness and warmth along right mandible very small residual swelling as well   Impression & Recommendations:  Problem # 1:  MUSCULOSKELETAL PAIN (ICD-781.99) Assessment New actually more like focal wasting  ?related to B12 shots doesn't look pathologic  will try B12 shots in thighs now  Problem # 2:  CELLULITIS, FACE (ICD-682.0) Assessment: New  apparent infection which has been partially treated with PCN will finish Rx with amoxil--likely dental etiology  Her updated medication list for this problem includes:    Amoxicillin 500 Mg Tabs (Amoxicillin) .Marland Kitchen... 2 tabs by mouth two times a day for infection  in face  Complete Medication  List: 1)  Prevacid 30 Mg Cpdr (Lansoprazole) .... Take 1 tablet by mouth three times a day 2)  Vitamin B-12 Cr 1000 Mcg Tbcr (Cyanocobalamin) .... Every month 3)  Miralax Powd (Polyethylene glycol 3350) .... As needed 4)  Allegra 180 Mg Tabs (Fexofenadine hcl) .... Take 1 tablet by mouth once a day 5)  Relpax 40 Mg Tabs (Eletriptan hydrobromide) .Marland Kitchen.. 1 at onset of migraine headache as needed 6)  Lorazepam 0.5 Mg Tabs (Lorazepam) .... Take 1 tablet by mouth two times a day as needed 7)  Synthroid 125 Mcg Tabs (Levothyroxine sodium) .... Take one by mouth daily 8)  Cyclobenzaprine Hcl 5 Mg Tabs (Cyclobenzaprine hcl) .Marland Kitchen.. 1-2 at bedtime for muscle pain as needed 9)  Qc Daily Multivitamins/iron Tabs (Multiple vitamins-iron) .... Take 1 by mouth once daily 10)  Epipen 0.3 Mg/0.50ml (1:1000) Devi (Epinephrine hcl (anaphylaxis)) .... As needed 11)  Hydrocodone-acetaminophen 5-325 Mg Tabs (Hydrocodone-acetaminophen) .Marland Kitchen.. 1 by mouth up to 4 times per day as needed for pain 12)  Epipen 0.3 Mg/0.96ml Devi (Epinephrine) .... Use as directed for severe allergic reaction 13)  Amoxicillin 500 Mg Tabs (Amoxicillin) .... 2 tabs by mouth two times a day for infection in face  Other Orders: Vit B12 1000 mcg (J3420) Admin of Therapeutic Inj  intramuscular or subcutaneous (27253)  Patient Instructions: 1)  Please schedule a follow-up appointment in 6 months .  Prescriptions: AMOXICILLIN 500 MG TABS (AMOXICILLIN) 2 tabs by mouth two times a day for infection in face  #28 x 0   Entered and Authorized by:   Cindee Salt MD   Signed by:   Cindee Salt MD on 07/09/2010   Method used:   [facility]        [facility]  [place] [facility]retail)       [address]       [place], [place]  [address]       Ph: [phone] or [phone]       Fax: [phone]   RxID:   [id number]   Current Allergies (reviewed today): ! AMBIEN (ZOLPIDEM TARTRATE) ! * LATEX ROZEREM (RAMELTEON) *  TETRACYCLINE ANALOGUES GROUP TEMAZEPAM (TEMAZEPAM)  Medication Administration  Injection # 1:    Medication: Vit B12 1000 mcg    Diagnosis: B12 DEFICIENCY (ICD-266.2)    Route: IM    Site: L deltoid    Exp Date: 11/21/2011    Lot #: [id number]    Mfr: [employer]    Patient tolerated injection without complications    Given by: Sydell Axon LPN (July 09, 2010 12:46 PM)  Orders Added: 1)  Vit B12 1000 mcg [J3420] 2)  Admin of Therapeutic Inj  intramuscular or subcutaneous [96372] 3)  Est. Patient Level IV [27062]

## 2011-01-20 NOTE — Progress Notes (Signed)
Summary: wants appt.  Phone Note Call from Patient Call back at [phone]   Caller: Patient Summary of Call: Patient left message in triage stating that she has a sore on her bikini like and wants an appt with Dr. Alphonsus Sias tomorrow. I trie calling patient back, but got no answer.  Left message on machine for patient to return call. Initial call taken by: Melody Comas,  October 02, 2010 6:01 PM  Follow-up for Phone Call        okay to add at the end of the day today Follow-up by: Cindee Salt MD,  October 03, 2010 8:01 AM  Additional Follow-up for Phone Call Additional follow up Details #1::        Left message on cell phone for patient to return call.  DeShannon Smith CMA Duncan Dull)  October 03, 2010 9:19 AM   spoke with husband at home, he stated pt was at work and he will tell her to call if he talks to her. DeShannon Katrinka Blazing CMA Duncan Dull)  October 03, 2010 9:21 AM   spoke with patient and she states she had gastric bypass in 2004 and because of the excess skin, she's keeps getting a rash underneath, this has been really bad for a week per pt. appt sch at 3:30 DeShannon Smith CMA Duncan Dull)  October 03, 2010 10:09 AM   Molli Knock may be intertrigo but need to check Additional Follow-up by: Cindee Salt MD,  October 03, 2010 1:25 PM

## 2011-01-20 NOTE — Letter (Signed)
Summary: [facility]   Imported By: Lanelle Bal 09/29/2010 09:36:18  _____________________________________________________________________  External Attachment:    Type:   Image     Comment:   External Document  Appended Document: [facility] Post op repair of Chiari malformation increasing activities 1 month follow up

## 2011-01-20 NOTE — Assessment & Plan Note (Signed)
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 Ambien (Zolpidem Tartrate) 2)  ! * Latex 3)  Rozerem (Ramelteon) 4)  * Tetracycline Analogues Group 5)  Temazepam (Temazepam)  Past History:  Past medical, surgical, family and social histories (including risk factors) reviewed for relevance to current acute and chronic problems.  Past Medical History: Reviewed history from 11/04/2009 and no changes required. 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 Caesarean section--1998 Cholecystectomy--1982 Hysterectomy Tubal ligation--1998 Tonsillectomy--1965 [place] filter--7/04 Vein ligation R leg--1995/1998 D&C--2003 thyroid biopsy-1/03---suggestive of  Hashimoto's Gastric bypass (L) Thyroidectomy--2008 Hernia Surgery  Family History: Reviewed history from 11/13/2009 and no changes required. Family History of Arthritis--Mom Family History of CAD Female 1st degree relative <50---Sister, Dad Family History Diabetes 1st degree relative Family History Ovarian cancer--Mat GM Family History Hypertension--multiple  Father-alive, CAD Mother-alive Siblings-alive, healthy  Social History: Reviewed history from 11/13/2009 and no changes required. 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  NAD Mouth:  lips quite swollen but down within 10 minutes here Tongue and pharynx are normal Neck:  supple and no masses.   Lungs:  normal respiratory effort, no intercostal retractions, no accessory muscle use, normal breath sounds, and no wheezes.  Normal expiratory phase Skin:  no hives or rashes Psych:  normally interactive, good eye contact, not anxious appearing, and not depressed appearing.     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 Take 1 tablet by mouth three times a day 2)  Vitamin B-12 Cr 1000 Mcg Tbcr (Cyanocobalamin) .... Every month 3)  Miralax Powd (Polyethylene glycol 3350) .... As needed 4)  Allegra 180 Mg Tabs (Fexofenadine hcl) .... Take 1 tablet by mouth once a day 5)  Relpax 40 Mg Tabs  (Eletriptan hydrobromide) .Marland Kitchen.. 1 at onset of migraine headache as  needed 6)  Lorazepam 0.5 Mg Tabs (Lorazepam) .... Take 1 tablet by mouth two times a day as needed 7)  Synthroid 125 Mcg Tabs (Levothyroxine sodium) .... Take one by mouth daily 8)  Cyclobenzaprine Hcl 5 Mg Tabs (Cyclobenzaprine hcl) .Marland Kitchen.. 1-2 at bedtime for muscle pain as needed 9)  Qc Daily Multivitamins/iron Tabs (Multiple vitamins-iron) .... Take 1 by mouth once daily 10)  Epipen 0.3 Mg/0.19ml (1:1000) Devi (Epinephrine hcl (anaphylaxis)) .... As needed 11)  Hydrocodone-acetaminophen 5-325 Mg Tabs (Hydrocodone-acetaminophen) .Marland Kitchen.. 1 by mouth up to 4 times per day as needed for pain 12)  Prednisone 20 Mg Tabs (Prednisone) .... 2 tabs daily for 5 days for allergic reaction 13)  Epipen 0.3 Mg/0.15ml Devi (Epinephrine) .... Use as directed for severe allergic reaction  Other Orders: Vit B12 1000 mcg (Z6109)  Patient Instructions: 1)  Keep regular follow up appt Prescriptions: EPIPEN 0.3 MG/0.3ML DEVI (EPINEPHRINE) Use as directed for severe allergic reaction  #2 x 3   Entered and Authorized by:   Cindee Salt MD   Signed by:   Cindee Salt MD on 01/21/2010   Method used:   [facility] [id number]       [address]       [place], [place]  [address]       Ph: [phone] or [phone]       Fax: [phone]   RxID:   [id number] PREDNISONE 20 MG TABS (PREDNISONE) 2 tabs daily for 5 days for allergic reaction  #10 x 0   Entered and Authorized by:   Cindee Salt MD   Signed by:   Cindee Salt MD on 01/21/2010   Method used:   [facility]        [facility] ([facility]ail[facility]       [address]       [place], [place]  [address]       Ph: [phone] or [phone]       Fax: [phone]   RxID:   [id number]   Current Allergies (reviewed today): ! AMBIEN (ZOLPIDEM TARTRATE) ! 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 Patient Level IV [15176] 2)  Vit B12 1000 mcg [J3420] 3)  Kenalog 10 mg inj [J3301] 4)  Admin of Therapeutic Inj  intramuscular or subcutaneous [[address]]

## 2011-01-21 ENCOUNTER — Emergency Department (HOSPITAL_COMMUNITY)
Admission: EM | Admit: 2011-01-21 | Discharge: 2011-01-21 | Disposition: A | Discharge status: Home / Self Care | Payer: Medicare Other | Attending: Emergency Medicine | Admitting: Emergency Medicine

## 2011-01-21 ENCOUNTER — Emergency Department (HOSPITAL_COMMUNITY): Payer: Medicare Other

## 2011-01-21 ENCOUNTER — Encounter: Payer: Medicare Other | Admitting: Oncology

## 2011-01-21 DIAGNOSIS — Z85850 Personal history of malignant neoplasm of thyroid: Secondary | ICD-10-CM | POA: Insufficient documentation

## 2011-01-21 DIAGNOSIS — Z79899 Other long term (current) drug therapy: Secondary | ICD-10-CM | POA: Insufficient documentation

## 2011-01-21 DIAGNOSIS — R51 Headache: Secondary | ICD-10-CM | POA: Insufficient documentation

## 2011-01-22 NOTE — Letter (Signed)
Summary: Out of Work  [facility] at [facility]  [address] [place], [place] [address]   Phone: [phone]  Fax: [phone]    December 23, 2010   Employee:  ADREANNA FICKEL    To Whom It May Concern:   For Medical reasons, please excuse the above named employee from work for the following dates:  Start:   December 18, 2010  End:   December 24, 2010    If you need additional information, please feel free to contact our office.         Sincerely,    Dwana Curd. Para March, M.D.  [facility]

## 2011-01-22 NOTE — Assessment & Plan Note (Signed)
Summary: diarrhea/alc   Vital Signs:  Patient profile:   50 year old female Height:      63 inches Weight:      179.25 pounds BMI:     31.87 Temp:     97.9 degrees F oral Pulse rate:   92 / minute Pulse rhythm:   regular BP sitting:   102 / 68  (left arm) Cuff size:   large  Vitals Entered By: Delilah Shan CMA  Dull) (December 24, 2010 3:15 PM) CC: Diarrhea.  Was given Pedialyte and Diphen-Atropine at [facility].   History of Present Illness: Was light headed on Tuesday before going to UC.  Then she got chills, diarrhea- profuse and watery.  Was losing weight and then went to UC on 12/22/09.  Was given pedialyte and oral meds to slow diarrhea.  The number of episodes has decreased but not the symptoms with each episode.  Son also with similar symptoms.  Can stand w/o getting dizzy.  Fatigued.  No vomiting.    H/o gastric bypass and gastric restriction.    Still with clear urine about 3 hours ago.  Works in [employer] trader.   Allergies: 1)  ! Ambien (Zolpidem Tartrate) 2)  ! * Latex 3)  Rozerem (Ramelteon) 4)  * Tetracycline Analogues Group 5)  Temazepam (Temazepam)  Review of Systems       See HPI.  Otherwise negative.    Physical Exam  General:  GEN: nad, alert and oriented, poor dentition HEENT: mucous membranes moist NECK: supple w/o LA CV: regular rate and rhythm, not tachy PULM: ctab, no inc wob ABD: soft, +bs, not tender to palpation  EXT: no edema, normal skin turgor SKIN: no acute rash    Impression & Recommendations:  Problem # 1:  DIARRHEA (ICD-787.91) Resolving, likely viral source.  Not dehydrated and this should gradually improve.  Potentially contagious.  Return to work when diarrhea resolved.  Oral rehydration in meantime and follow up as needed.  She agrees.  Nontoxic.  I d/w patient the typical course of symptoms with this.  She understood.    Complete Medication List: 1)  Lorazepam 0.5 Mg Tabs (Lorazepam) .... Take 1 tablet by mouth two times a day as  needed 2)  Synthroid 125 Mcg Tabs (Levothyroxine sodium) .... Take one by mouth daily 3)  Cyclobenzaprine Hcl 5 Mg Tabs (Cyclobenzaprine hcl) .Marland Kitchen.. 1-2 at bedtime for muscle pain as needed 4)  Qc Daily Multivitamins/iron Tabs (Multiple vitamins-iron) .... Take 1 by mouth once daily 5)  Epipen 0.3 Mg/0.56ml Devi (Epinephrine) .... Use as directed for severe allergic reaction 6)  Prevacid 30 Mg Cpdr (Lansoprazole) .... Take 1 tablet by mouth three times a day 7)  Vitamin B-12 Cr 1000 Mcg Tbcr (Cyanocobalamin) .... Every month 8)  Miralax Powd (Polyethylene glycol 3350) .... As needed 9)  Ketoconazole 2 % Crea (Ketoconazole) .... Apply to rash under skin fold three times a day as needed for rash 10)  Sertraline Hcl 100 Mg Tabs (Sertraline hcl) .... 1/2 daily and increase as directed  Patient Instructions: 1)  Keep drinking the pedialyte and let us know if you don't get better gradually.  I think this will gradually improve.     Orders Added: 1)  Est. Patient Level III [52841]    Current Allergies (reviewed today): ! AMBIEN (ZOLPIDEM TARTRATE) ! * LATEX ROZEREM (RAMELTEON) * TETRACYCLINE ANALOGUES GROUP TEMAZEPAM (TEMAZEPAM)

## 2011-01-22 NOTE — Letter (Signed)
Summary: Out of Work  [facility] at [facility]  [address] [place], [place] [address]   Phone: [phone]  Fax: [phone]    December 24, 2010   Employee:  [name]    To Whom It May Concern:   For Medical reasons, please excuse the above named employee from work for the following dates:  Start:   from 12/22/09  End:   until diarrhea resolved, potentially contagious.    If you need additional information, please feel free to contact our office.         Sincerely,    Crawford Givens MD

## 2011-01-22 NOTE — Assessment & Plan Note (Signed)
Summary: THROAT,EARS,FEVER/CLE   Vital Signs:  Patient profile:   50 year old female Weight:      191 pounds Temp:     98.2 degrees F oral Pulse rate:   80 / minute Pulse rhythm:   regular BP sitting:   118 / 82  (left arm) Cuff size:   large  Vitals Entered By: Selena Batten Dance CMA Duncan Dull) (December 05, 2010 4:34 PM) CC: Sore throat/ earache/ fever   History of Present Illness: CC: ST, earache, fever  1 wk h/o earache, cough, fever and ST.  Started with ears hurting R>L.  Sore throat and drippy nose for last few days.  Fever to 101.2 3 nights ago.  taking nyquil, cold and flu, not helping much.  No more fevers.  No abd pain, n/v/ change in diarrhea, rashes, myalgias or arthralgias.  had flu shot.  + sick contacts recently - son, best friend, mother in law.  No smokers at home.  No asthma.  + "severe" allergies  requests refills of ativan, epipen, synthroid, and flexeril.  don't see where we have refilled ativan so will have patient call PCP to request lorazepam.  refilled other 3.  Current Medications (verified): 1)  Lorazepam 0.5 Mg  Tabs (Lorazepam) .... Take 1 Tablet By Mouth Two Times A Day As Needed 2)  Synthroid 125 Mcg  Tabs (Levothyroxine Sodium) .... Take One By Mouth Daily 3)  Cyclobenzaprine Hcl 5 Mg  Tabs (Cyclobenzaprine Hcl) .Marland Kitchen.. 1-2 At Bedtime For Muscle Pain As Needed 4)  Qc Daily Multivitamins/iron  Tabs (Multiple Vitamins-Iron) .... Take 1 By Mouth Once Daily 5)  Epipen 0.3 Mg/0.96ml Devi (Epinephrine) .... Use As Directed For Severe Allergic Reaction 6)  Prevacid 30 Mg  Cpdr (Lansoprazole) .... Take 1 Tablet By Mouth Three Times A Day 7)  Vitamin B-12 Cr 1000 Mcg  Tbcr (Cyanocobalamin) .... Every Month 8)  Miralax   Powd (Polyethylene Glycol 3350) .... As Needed 9)  Ketoconazole 2 % Crea (Ketoconazole) .... Apply To Rash Under Skin Fold Three Times A Day As Needed For Rash 10)  Sertraline Hcl 100 Mg Tabs (Sertraline Hcl) .... 1/2 Daily and Increase As  Directed  Allergies: 1)  ! Ambien (Zolpidem Tartrate) 2)  ! 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  NAD, conversant, nontoxic.  not congested.  Head:  Normocephalic and atraumatic without obvious abnormalities. No apparent alopecia or balding. Eyes:  pupils equal and pupils round.   Ears:  TMs clear bilaterally.  canals clear. Nose:  nares clear.  not swollen, no discharge Mouth:  poor dentition.  Slight injection along gingiva.  no exudates. + some PNDrip. Neck:  supple and no masses.  no LAD Lungs:  normal respiratory effort, normal breath sounds, and no wheezes.   Heart:  normal rate, regular rhythm, no murmur, and no gallop.   Pulses:  2+ rad pulses Extremities:  superficial varicosities in left foot and  ankle, R leg more swollen chronically   Impression & Recommendations:  Problem # 1:  VIRAL URI (ICD-465.9) supportive care.  red flags to return discussed.  no need for abx.    Complete Medication List: 1)  Lorazepam 0.5 Mg Tabs (Lorazepam) .... Take 1 tablet by mouth two times a day as needed 2)  Synthroid 125 Mcg Tabs (Levothyroxine sodium) .... Take one by mouth daily 3)  Cyclobenzaprine Hcl 5 Mg Tabs (Cyclobenzaprine hcl) .Marland Kitchen.. 1-2 at bedtime  for muscle pain as needed 4)  Qc Daily Multivitamins/iron Tabs (Multiple vitamins-iron) .... Take 1 by mouth once daily 5)  Epipen 0.3 Mg/0.64ml Devi (Epinephrine) .... Use as directed for severe allergic reaction 6)  Prevacid 30 Mg Cpdr (Lansoprazole) .... Take 1 tablet by mouth three times a day 7)  Vitamin B-12 Cr 1000 Mcg Tbcr (Cyanocobalamin) .... Every month 8)  Miralax Powd (Polyethylene glycol 3350) .... As needed 9)  Ketoconazole 2 % Crea (Ketoconazole) .... Apply to rash under skin fold three times a day as needed for rash 10)  Sertraline Hcl 100 Mg Tabs (Sertraline hcl) .... 1/2 daily and increase as directed  Other Orders: Vit B12 1000 mcg (J3420) Admin of Therapeutic Inj  intramuscular or subcutaneous ([address])  Patient Instructions: 1)  Sounds like you have a viral upper respiratory infection. 2)  Antibiotics are not needed for this.  Viral infections usually take 7-10 days to resolve.  The cough can last 4 weeks to go away. 3)  Nasal saline or neti pot to drain sinuses. 4)  Guaifenesin IR 400mg  1 1/2 pills in am and at noon with plenty of fluid to mobilize mucous. 5)  Keep pushing fluid. 6)  Please return if you are not improving as expected, or if you have high fevers (>101.5) or difficulty swallowing/breathing. 7)  Call clinic with questions.  Pleasure to see you today!  Prescriptions: EPIPEN 0.3 MG/0.3ML DEVI (EPINEPHRINE) Use as directed for severe allergic reaction  #2 x 3   Entered and Authorized by:   Eustaquio Boyden  MD   Signed by:   Eustaquio Boyden  MD on 12/05/2010   Method used:   [facility] [id number]       [address]       [place], [place]  [address]       Ph: [phone] or [phone]       Fax: [phone]   RxID:   [id number] CYCLOBENZAPRINE HCL 5 MG  TABS (CYCLOBENZAPRINE HCL) 1-2 at bedtime for muscle pain as needed  #60 Tablet x 4   Entered and Authorized by:   Eustaquio Boyden  MD   Signed by:    Eustaquio Boyden  MD on 12/05/2010   Method used:   [facility]        [facility] ([facility]ail[facility]       [address]       [place], [place]  [address]       Ph: [phone] or [phone]       Fax: [phone]   RxID:   [id number] SYNTHROID 125 MCG  TABS (LEVOTHYROXINE SODIUM) Take one by mouth daily  #30 x 12   Entered and Authorized by:   Eustaquio Boyden  MD   Signed by:   Eustaquio Boyden  MD on 12/05/2010   Method used:   [facility]        [phone] (retail)  [address]       [place], [place]  [address]       Ph: [phone] or [phone]       Fax: [phone]   RxID:   [id number]    Medication Administration  Injection # 1:    Medication: Vit B12 1000 mcg    Diagnosis: B12 DEFICIENCY (ICD-266.2)    Route: IM    Site: L deltoid    Exp Date: 05/20/2012    Lot #: [id number]    Mfr: [employer]    Comments: Per Dr. Sharen Hones    Patient tolerated injection without complications    Given by: Selena Batten Dance CMA Duncan Dull) (December 05, 2010 5:18 PM)  Orders Added: 1)  Vit B12 1000 mcg [J3420] 2)  Admin of Therapeutic Inj  intramuscular or subcutaneous [96372] 3)  Est. Patient Level III [56387]    Current Allergies (reviewed today): ! AMBIEN (ZOLPIDEM TARTRATE) ! * LATEX ROZEREM (RAMELTEON) * TETRACYCLINE ANALOGUES GROUP TEMAZEPAM (TEMAZEPAM)

## 2011-01-22 NOTE — Assessment & Plan Note (Signed)
Summary: ROA FOR 6 MONTH FOLLOW-UP/JRR   Vital Signs:  Patient profile:   50 year old female Weight:      188 pounds Temp:     98.6 degrees F oral Pulse rate:   53 / minute Pulse rhythm:   regular BP sitting:   107 / 66  (left arm) Cuff size:   large  Vitals Entered By: Mervin Hack CMA Duncan Dull) (January 06, 2011 8:07 AM) CC: 6 month follow-up   History of Present Illness: Feels better from the diarrhea--has been going through the family  Feels exhaused all the time relates to need for more iron  Mood has been up and down Good days and bad days Feels ready for bed after dinner (has private time then) Home is "crazy" ------Mom constantly complaining (in their house)  Still has neurosurgery follow up recent headaches  Had brief spell wondering where she was when she walked out of cafeteria at school. Resolved after several seconds. No recurrence  Ongoing leg swelling--esp in left does have pain by the end of the day  Allergies: 1)  ! Ambien (Zolpidem Tartrate) 2)  ! * Latex 3)  Rozerem (Ramelteon) 4)  * Tetracycline Analogues Group 5)  Temazepam (Temazepam)  Past History:  Past medical, surgical, family and social histories (including risk factors) reviewed for relevance to current acute and chronic problems.  Past Medical History: Reviewed history from 01/31/2010 and no changes required. Anemia-------------------------------------------------------Dr Cherene Altes DVT, hx of GERD----------------------------------------------------------Dr Christella Hartigan Hypothyroidism Allergic rhinitis Anxiety Depression Thyroid nodules--path okay at surgery Chronic venous insufficiency  Past Surgical History: Reviewed history from 10/03/2010 and no changes required. Caesarean section--1998 Cholecystectomy--1982 Hysterectomy Tubal ligation--1998 Tonsillectomy--1965 Greenfield filter--7/04 Vein ligation R leg--1995/1998 D&C--2003 thyroid biopsy-1/03---suggestive of  Hashimoto's Gastric bypass (L) Thyroidectomy--2008 Hernia Surgery 6/11 Repair of Chiari malformation--Dr [facility] 8/11 repair of myelocele but Dr Jordan Likes  Family History: Reviewed history from 11/13/2009 and no changes required. Family History of Arthritis--Mom Family History of CAD Female 1st degree relative <50---Sister, Dad Family History Diabetes 1st degree relative Family History Ovarian cancer--Mat GM Family History Hypertension--multiple  Father-alive, CAD Mother-alive Siblings-alive, healthy  Social History: Reviewed history from 07/09/2010 and no changes required. 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   Neck:  supple, no masses, no thyromegaly, and no cervical lymphadenopathy.   Lungs:  normal respiratory effort, no intercostal retractions, no accessory muscle use, and normal breath sounds.   Heart:  normal rate, regular rhythm, no murmur, and no gallop.   Abdomen:  soft and non-tender.   Extremities:  2+ non pitting edema Not tense Psych:  normally interactive, good eye contact, not anxious appearing, and dysphoric affect.     Impression & Recommendations:  Problem # 1:  DEPRESSION (ICD-311) Assessment Deteriorated mood not great mostly like ongoing dysthymia will try increasing sertraline to 100mg  daily  Her updated medication list for this problem includes:    Lorazepam 0.5 Mg Tabs (Lorazepam) .Marland Kitchen... Take 1 tablet by mouth two times a day as needed    Sertraline Hcl 100 Mg Tabs (Sertraline hcl) .Marland Kitchen... 1 tab daily for mood problems  Problem # 2:  VENOUS INSUFFICIENCY, CHRONIC (  ICD-459.81) Assessment: Unchanged stable  status Can't tolerate support hose----has increased pain in popliteal area  Problem # 3:  ANXIETY (ICD-300.00) Assessment: Unchanged ongoing stress but no sig change  Her updated medication list for this problem includes:    Lorazepam 0.5 Mg Tabs (Lorazepam) .Marland Kitchen... Take 1 tablet by mouth two times a day as needed    Sertraline Hcl 100 Mg Tabs (Sertraline hcl) .Marland Kitchen... 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 Take 1 tablet by mouth two times a day as needed 2)  Synthroid 125 Mcg Tabs (Levothyroxine sodium) .... Take one by mouth daily 3)  Cyclobenzaprine Hcl 5 Mg Tabs (Cyclobenzaprine hcl) .Marland Kitchen.. 1-2 at bedtime for muscle pain as needed 4)  Qc Daily Multivitamins/iron Tabs (Multiple vitamins-iron) .... Take 1 by mouth once daily 5)  Epipen 0.3 Mg/0.5ml Devi (Epinephrine) .... Use as directed for severe allergic reaction 6)  Prevacid 30 Mg Cpdr (Lansoprazole) .... Take 1 tablet by mouth three times a day 7)  Vitamin B-12 Cr 1000 Mcg Tbcr (Cyanocobalamin) .... Every month 8)  Miralax Powd (Polyethylene glycol 3350) .... As needed 9)  Ketoconazole 2 % Crea (Ketoconazole) .... Apply to rash under skin fold three times a day as needed for rash 10)  Sertraline Hcl 100 Mg Tabs (Sertraline hcl) .Marland Kitchen.. 1 tab daily for mood problems  Other Orders: Vit B12 1000 mcg (J3420) Admin of Therapeutic Inj  intramuscular or subcutaneous (16109)  Patient Instructions: 1)  Please schedule a follow-up appointment in 6 months for physical Prescriptions: SERTRALINE HCL 100 MG TABS (SERTRALINE HCL) 1 tab daily for mood problems  #30 x 11   Entered and Authorized by:   Cindee Salt MD   Signed by:   Cindee Salt MD on 01/06/2011   Method used:   [facility]        [facility]  [address] [id number]       [address]       [place], [place]  [address]       Ph: [phone] or [phone]       Fax: [phone]   RxID:   [id number]    Medication Administration  Injection # 1:    Medication: Vit B12 1000 mcg    Diagnosis: B12 DEFICIENCY (ICD-266.2)    Route: IM    Site: L deltoid    Exp Date: 09/20/2012    Lot #: [id number]    Mfr: American Regent    Patient tolerated injection without complications    Given by: Mervin Hack CMA Duncan Dull) (January 06, 2011 8:49 AM)  Orders Added: 1)  Est. Patient Level IV [03474] 2)  Vit B12 1000 mcg [J3420] 3)  Admin of Therapeutic Inj  intramuscular or subcutaneous [96372]    Current Allergies (reviewed today): ! AMBIEN (ZOLPIDEM TARTRATE) ! * LATEX ROZEREM (RAMELTEON) * TETRACYCLINE ANALOGUES GROUP TEMAZEPAM (TEMAZEPAM)   Medication Administration  Injection # 1:    Medication: Vit B12 1000 mcg    Diagnosis: B12 DEFICIENCY (ICD-266.2)    Route: IM    Site: L deltoid    Exp Date: 09/20/2012    Lot #: [id number]    Mfr: American Regent    Patient tolerated injection without complications    Given by: Mervin Hack CMA Duncan Dull) (January 06, 2011 8:49 AM)  Orders Added: 1)  Est. Patient Level IV [  99214] 2)  Vit B12 1000 mcg [J3420] 3)  Admin of Therapeutic Inj  intramuscular or subcutaneous [91478]

## 2011-01-22 NOTE — Progress Notes (Signed)
Summary: call a nurse   Phone Note Call from Patient   Caller: Patient Call For: Cindee Salt MD Summary of Call: Triage Record Num: [id number] Operator: Geanie Berlin Patient Name: [name] Call Date & Time: 12/22/2010 1:59:21PM Patient Phone: [phone] PCP: Tillman Abide Patient Gender: Female PCP Fax : [phone] Patient DOB: 02-19-1961 Practice Name: [facility] Reason for Call: 50 yo LMP: hysterectomy. Calling re severe diarrhea since 12/19/10 with aches/chills and intermittent fever. Temp 100.1 AX 0700; Afebrile now at 1405. Took Imodium wihtout relief 12/22/10. Due for iron infusion 01/08/11. Void small amt; last void unknown. Lighheaded at times. Advised to see UC or ED now for severe pain/cramping in abdomen that interferes with ability to carry our normal activities per Diarrhea or other change in Bowel Habits Guideline. Protocol(s) Used: Diarrhea or Other Change in Bowel Habits Recommended Outcome per Protocol: See ED Immediately Reason for Outcome: Severe pain/cramping in abdomen or rectum that interferes with ability to carry out normal activities or sleep Care Advice:  ~ Another adult should drive. Call EMS 911 if signs and symptoms of shock develop (such as unable to stand due to faintness, dizziness, or lightheadedness; new onset of confusion; slow to respond or difficult to awaken; skin is pale, gray, cool, or moist to touch; severe weakness; loss of consciousness).  ~ Change position slowly. Sit for a couple of minutes before standing to walk. Quick position changes may cause or worsen symptoms.  ~  ~ IMMEDIATE ACTION Write down provider's name. List or place the following in a bag for transport with the patient: current prescription and/or nonprescription medications; alternative treatments, therapies and medications; and street drugs.  ~ May have clear liquids (such as water, clear fruit juices without pulp, soda, tea or coffee  without dairy or non-dairy creamer, clear broth or bouillon, oral hydration solution, or plain gelatin, fruit ices/popsicles, hard candy) but do not eat solid foods before being seen by yo Initial call taken by: Melody Comas,  December 23, 2010 11:36 AM  Follow-up for Phone Call        please call and get update on patient. Crawford Givens MD  December 23, 2010 11:40 AM   Left message on voicemail  to return call. Lugene Fuquay CMA (AAMA)  December 23, 2010 1:10 PM   W[facility] to [facility] - [facility].  They gave her Pedialyte and something for diarrhea. ( Diphen-Atropine. )  She says she has had diarrhea since Thursday and has lost 15 pounds.  No N & V.  Fever has been as high as 102 but now around 100.1.  She said she was given a note for work for today but she does not anticipate being able to go to work tomorrow or maybe more.  She works in a Futures trader.  She says she is very weak.  Lugene Fuquay CMA Lavone Weisel Dull)  December 23, 2010 1:17 PM    Additional Follow-up for Phone Call Additional follow up Details #1::        Please get her a work note if she needs it.  If her symptoms continue or worsen, then have her follow up.  Crawford Givens MD  December 23, 2010 1:51 PM   Patient Advised.   Note written and left at the front for pick up .Lugene Fuquay CMA Markies Mowatt Dull)  December 23, 2010 2:49 PM

## 2011-02-02 ENCOUNTER — Encounter: Payer: Self-pay | Admitting: Family Medicine

## 2011-02-02 ENCOUNTER — Ambulatory Visit (INDEPENDENT_AMBULATORY_CARE_PROVIDER_SITE_OTHER): Payer: Medicare Other | Admitting: Family Medicine

## 2011-02-02 DIAGNOSIS — E538 Deficiency of other specified B group vitamins: Secondary | ICD-10-CM

## 2011-02-02 DIAGNOSIS — M549 Dorsalgia, unspecified: Secondary | ICD-10-CM

## 2011-02-02 DIAGNOSIS — M545 Low back pain: Secondary | ICD-10-CM | POA: Insufficient documentation

## 2011-02-02 DIAGNOSIS — M79604 Pain in right leg: Secondary | ICD-10-CM | POA: Insufficient documentation

## 2011-02-11 NOTE — Assessment & Plan Note (Signed)
Summary: BACK PAIN/CLE  MEDICARE   Vital Signs:  Patient profile:   50 year old female Weight:      188.75 pounds Temp:     98.4 degrees F oral Pulse rate:   64 / minute Pulse rhythm:   regular BP sitting:   108 / 60  (left arm) Cuff size:   large  Vitals Entered By: Selena Batten Dance CMA Duncan Dull) (February 02, 2011 4:26 PM) CC: Thoracic back pain Comments Radiates to right lumbar area and into Right leg   History of Present Illness: CC: back pain  h/o [address] Chiari malformation s/p surgery 05/2010 and 07/2010 with bruise in back after second surgery per patient.  1 mo h/o back pain worsening.  This past weekend "totally gave out".  Has tried tylenol, hydrocodone, icy hot patch, tramadol.  No relief from this and unable to put pressure on right leg 2/2 pain.  Pain worse with transfers from positions, unable to stay in one position for prolonged period.  Pain with sitting, standing, laying down.  nothing seems to relieve pain.  pain shoots down leg to posterior knee.  endorses L foot numbness that has been present since surgery.  felt some swelling in back R side a few days ago.  Current pain starts at base of spine, travels down into buttock and shoots down back of leg.  No weakness in leg.  No bowel/bladder accidents.  No fevers/chills.  Dr. Dutch Quint did surgery, last saw him 08/2010.  was to see again next month.  Hydrocodone not helping.  Off zoloft.  Current Medications (verified): 1)  Lorazepam 0.5 Mg  Tabs (Lorazepam) .... Take 1 Tablet By Mouth Two Times A Day As Needed 2)  Synthroid 125 Mcg  Tabs (Levothyroxine Sodium) .... Take One By Mouth Daily 3)  Qc Daily Multivitamins/iron  Tabs (Multiple Vitamins-Iron) .... Take 1 By Mouth Once Daily 4)  Epipen 0.3 Mg/0.20ml Devi (Epinephrine) .... Use As Directed For Severe Allergic Reaction 5)  Prevacid 30 Mg  Cpdr (Lansoprazole) .... Take 1 Tablet By Mouth Three Times A Day 6)  Vitamin B-12 Cr 1000 Mcg  Tbcr (Cyanocobalamin) .... Every Month 7)   Miralax   Powd (Polyethylene Glycol 3350) .... As Needed 8)  Ketoconazole 2 % Crea (Ketoconazole) .... Apply To Rash Under Skin Fold Three Times A Day As Needed For Rash 9)  Sertraline Hcl 100 Mg Tabs (Sertraline Hcl) .Marland Kitchen.. 1 Tab Daily For Mood Problems 10)  Hydrocodone-Acetaminophen 5-500 Mg Tabs (Hydrocodone-Acetaminophen) .Marland Kitchen.. 1-2 Every 4-6 Hours As Needed Severe Pain. 11)  Backache Relief Extra Strength 580 Mg Tabs (Magnesium Salicylate Tetrahyd) .... 2 Every 6 Hours As Needed For Back Pain  Allergies: 1)  ! Ambien (Zolpidem Tartrate) 2)  ! 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  uncomfortable and stiff movements Msk:  tenderness to palpation midline from lower thoracic to mid lumbar region, also tender and tight  right paraspinous mm lumbar region.  No SI joint pain, very stiff with flexion/extension at spine.  neg SLR bilaterally, some pain in back with raising R leg, but no radiculopathy noted.  no pain with int/ext rotation at hip Pulses:  2+ rad /DP/PT pulses Extremities:  2+ non pitting edema Not tense Neurologic:  diminished DTRs bilaterally, strength diminished on R seems 2/2 pain   Impression & Recommendations:  Problem # 1:   BACK PAIN (ICD-724.5) Assessment New sounds like going on for several months, acutely worsened this past weekend.  ? acute strain vs sciatica.  doubt HNP.  treat with steroid course given inflammatory component, and has not resolved with other conservative treatments.  there is some muscle strain component so added muscle relaxant.  trial of this, update if red flags, let us know if worsening.  If not better, recommended call Dr. Dutch Quint for sooner f/u.  The following medications were removed from the medication list:    Cyclobenzaprine Hcl 5 Mg Tabs (Cyclobenzaprine hcl) .Marland Kitchen... 1-2 at bedtime for muscle pain as needed Her updated medication list for this problem includes:    Hydrocodone-acetaminophen 5-500 Mg Tabs (Hydrocodone-acetaminophen) .Marland Kitchen... 1-2 every 4-6 hours as needed severe pain.    Backache Relief Extra Strength 580 Mg Tabs (Magnesium salicylate tetrahyd) .Marland Kitchen... 2 every 6 hours as needed for back pain    Flexeril 10 Mg Tabs (Cyclobenzaprine hcl) .Marland Kitchen... Take one by mouth two times a day as needed muscle spasm, sedation precautions  Problem # 2:  B12 DEFICIENCY (ICD-266.2) per patient takes monthly, due for injection. given today Orders: Vit B12 1000 mcg (J3420) Admin of Therapeutic Inj  intramuscular or subcutaneous (16109)  Complete Medication List: 1)  Lorazepam 0.5 Mg Tabs (Lorazepam) .... Take 1 tablet by mouth two times a day as needed 2)  Synthroid 125 Mcg Tabs (Levothyroxine sodium) .... Take one by mouth daily 3)  Qc Daily Multivitamins/iron Tabs (Multiple vitamins-iron) .... Take 1 by mouth once daily 4)  Epipen 0.3 Mg/0.70ml Devi (Epinephrine) .... Use as directed for severe allergic reaction 5)  Prevacid 30 Mg Cpdr (Lansoprazole) .... Take 1 tablet by mouth three times a day 6)  Vitamin B-12 Cr 1000 Mcg Tbcr (Cyanocobalamin) .... Every month 7)  Miralax Powd (Polyethylene glycol 3350) .... As needed 8)  Ketoconazole 2 % Crea (Ketoconazole) .... Apply to rash under skin fold  three times a day as needed for rash 9)  Sertraline Hcl 100 Mg Tabs (Sertraline hcl) .Marland Kitchen.. 1 tab daily for mood problems 10)  Hydrocodone-acetaminophen 5-500 Mg Tabs (Hydrocodone-acetaminophen) .Marland Kitchen.. 1-2 every 4-6 hours as needed severe pain. 11)  Backache Relief Extra Strength 580 Mg Tabs (Magnesium salicylate tetrahyd) .... 2 every 6 hours as needed for back pain 12)  Prednisone 20 Mg Tabs (Prednisone) .... Take 2 daily x 7 days 13)  Flexeril 10 Mg Tabs (Cyclobenzaprine hcl) .... Take one by mouth two times a day as needed muscle spasm, sedation precautions  Patient Instructions: 1)  B12 shot today. 2)  Course of steroids for inflammation in back.  start flexeril as well.   3)  Call for sooner appointment with Dr. Dutch Quint. 4)  return sooner if any bowel/bladder accidents or problems, fevers, or worsening pain. Prescriptions: FLEXERIL 10 MG TABS (CYCLOBENZAPRINE HCL) take one by mouth two times a day as needed muscle spasm, sedation precautions  #30 x 0   Entered and Authorized by:   Eustaquio Boyden  MD   Signed by:   Eustaquio Boyden  MD on 02/02/2011   Method used:   [facility]  [address] [facility]       [address]       [place], [place]  [address]       Ph: [phone] or [phone]       Fax: [phone]   RxID:   [id number] PREDNISONE 20 MG TABS (PREDNISONE) take 2 daily x 7 days  #14 x 0   Entered and Authorized by:   Eustaquio Boyden  MD   Signed by:   Eustaquio Boyden  MD on 02/02/2011   Method used:   [facility]        [facility] [id number]       [address]       [place], [place]  [address]       Ph: [phone] or [phone]       Fax: [phone]   RxID:   [id number]    Medication Administration  Injection # 1:    Medication: Vit B12 1000 mcg    Diagnosis: B12 DEFICIENCY (ICD-266.2)    Route: IM    Site: R deltoid    Exp Date: 10/20/2012    Lot #: [id number]    Mfr: American Regent     Comments: Per Dr. Sharen Hones    Patient tolerated injection without complications    Given by: Selena Batten Dance CMA Duncan Dull) (February 02, 2011 5:02 PM)  Orders Added: 1)  Vit B12 1000 mcg [J3420] 2)  Admin of Therapeutic Inj  intramuscular or subcutaneous [96372] 3)  Est. Patient Level III [62831]    Current Allergies (reviewed today): ! AMBIEN (ZOLPIDEM TARTRATE) ! * LATEX ROZEREM (RAMELTEON) * TETRACYCLINE ANALOGUES GROUP TEMAZEPAM (TEMAZEPAM)   Appended Document: BACK PAIN/CLE  MEDICARE     Clinical Lists Changes  Orders: Added new Service order of Prescription Created Electronically [phone]) - Signed

## 2011-02-11 NOTE — Letter (Signed)
Summary: Out of Work  [facility]  [address] [place], [place] [address]   Phone: [phone]  Fax: [phone]    February 02, 2011   Employee:  [name]    To Whom It May Concern:   For Medical reasons, please excuse the above named employee from work for the following dates:  Start:  February 02, 2011  pm  End:  February 03, 2011   If you need additional information, please feel free to contact our office.         Sincerely,    Eustaquio Boyden  MD

## 2011-03-06 LAB — CBC
Hemoglobin: 13.6 g/dL (ref 12.0–15.0)
MCH: 32.9 pg (ref 26.0–34.0)
RBC: 4.13 MIL/uL (ref 3.87–5.11)

## 2011-03-06 LAB — SURGICAL PCR SCREEN
MRSA, PCR: NEGATIVE
Staphylococcus aureus: NEGATIVE

## 2011-03-06 LAB — DIFFERENTIAL
Lymphocytes Relative: 31 % (ref 12–46)
Lymphs Abs: 2.9 10*3/uL (ref 0.7–4.0)
Neutrophils Relative %: 61 % (ref 43–77)

## 2011-03-06 LAB — TYPE AND SCREEN
ABO/RH(D): B POS
Antibody Screen: NEGATIVE

## 2011-03-09 LAB — CBC
MCHC: 33 g/dL (ref 30.0–36.0)
RDW: 12.8 % (ref 11.5–15.5)

## 2011-03-09 LAB — ABO/RH: ABO/RH(D): B POS

## 2011-03-09 LAB — DIFFERENTIAL
Basophils Absolute: 0.1 10*3/uL (ref 0.0–0.1)
Basophils Relative: 1 % (ref 0–1)
Eosinophils Absolute: 0.1 10*3/uL (ref 0.0–0.7)
Monocytes Absolute: 0.3 10*3/uL (ref 0.1–1.0)
Neutro Abs: 4.8 10*3/uL (ref 1.7–7.7)
Neutrophils Relative %: 64 % (ref 43–77)

## 2011-03-09 LAB — BASIC METABOLIC PANEL
CO2: 29 mEq/L (ref 19–32)
Calcium: 8.8 mg/dL (ref 8.4–10.5)
Creatinine, Ser: 0.53 mg/dL (ref 0.4–1.2)
Glucose, Bld: 89 mg/dL (ref 70–99)

## 2011-03-09 LAB — APTT: aPTT: 31 seconds (ref 24–37)

## 2011-03-09 LAB — PROTIME-INR: INR: 1.09 (ref 0.00–1.49)

## 2011-03-09 LAB — TYPE AND SCREEN: Antibody Screen: NEGATIVE

## 2011-04-29 ENCOUNTER — Encounter: Payer: Self-pay | Admitting: Internal Medicine

## 2011-04-30 ENCOUNTER — Encounter: Payer: Self-pay | Admitting: Internal Medicine

## 2011-04-30 ENCOUNTER — Ambulatory Visit (INDEPENDENT_AMBULATORY_CARE_PROVIDER_SITE_OTHER): Payer: Medicare Other | Admitting: Internal Medicine

## 2011-04-30 VITALS — BP 108/70 | HR 66 | Temp 98.0°F | Ht 63.0 in | Wt 193.0 lb

## 2011-04-30 DIAGNOSIS — E538 Deficiency of other specified B group vitamins: Secondary | ICD-10-CM

## 2011-04-30 DIAGNOSIS — G479 Sleep disorder, unspecified: Secondary | ICD-10-CM

## 2011-04-30 MED ORDER — TEMAZEPAM 15 MG PO CAPS: 15.0000 mg | ORAL_CAPSULE | Freq: Every evening | ORAL | Status: DC | PRN

## 2011-04-30 MED ORDER — CYANOCOBALAMIN 1000 MCG/ML IJ SOLN
1000.0000 ug | Freq: Once | INTRAMUSCULAR | Status: AC
  Administered 2011-04-30: 1000 ug via INTRAMUSCULAR

## 2011-04-30 NOTE — Progress Notes (Signed)
Subjective:    Patient ID: [name], female    DOB: 1961-01-16, 50 y.o.   MRN: [id number]  HPI Wonders about trying temazepam Has borrowed from her mom due to trouble sleeping Has tried 15mg  and did well Always up early anyway--- always by 6AM. Is wide awake in AM  Having lots of stress with Dad Flys off the handle on her sons without provocation Really has slipped since past surgery  Current outpatient prescriptions:cyclobenzaprine (FLEXERIL) 10 MG tablet, Take 10 mg by mouth 2 (two) times daily as needed.  , Disp: , Rfl: ;  EPINEPHrine (EPIPEN JR) 0.15 MG/0.3ML injection, Inject 0.15 mg into the muscle as needed.  , Disp: , Rfl: ;  HYDROcodone-acetaminophen (VICODIN) 5-500 MG per tablet, Take 1-2 tablets by mouth. every 4-6 hours as needed for severe pain , Disp: , Rfl:  ketoconazole (NIZORAL) 2 % cream, Apply 1 application topically 3 (three) times daily as needed.  , Disp: , Rfl: ;  lansoprazole (PREVACID) 30 MG capsule, Take 30 mg by mouth 3 (three) times daily.  , Disp: , Rfl: ;  levothyroxine (SYNTHROID, LEVOTHROID) 125 MCG tablet, Take 125 mcg by mouth daily.  , Disp: , Rfl: ;  Multiple Vitamins-Iron (DAILY MULTIVITAMINS/IRON PO), Take by mouth daily.  , Disp: , Rfl:  polyethylene glycol (MIRALAX / GLYCOLAX) packet, Take 17 g by mouth as needed.  , Disp: , Rfl: ;  sertraline (ZOLOFT) 100 MG tablet, Take 100 mg by mouth daily.  , Disp: , Rfl: ;  vitamin B-12 (CYANOCOBALAMIN) 1000 MCG tablet, Take 1,000 mcg by mouth every 30 (thirty) days.  , Disp: , Rfl: ;  DISCONTD: LORazepam (ATIVAN) 0.5 MG tablet, Take 0.5 mg by mouth 2 (two) times daily as needed.  , Disp: , Rfl:   Past Medical History  Diagnosis Date  . Anemia   . GERD (gastroesophageal reflux disease)   . Thyroid disease   . Allergy   . Anxiety   . Depression   . History of DVT (deep vein thrombosis)   . Chronic venous insufficiency   . Greenfield filter in place     Past Surgical History  Procedure Date  .  Cesarean section 1998  . Cholecystectomy 1982  . Abdominal hysterectomy   . Tubal ligation 1998  . Tonsillectomy 1965  . Vein ligation 1995/1998    right leg  . Dilation and curettage of uterus 2003  . Biopsy thyroid 01/03    suggestive of Hashimoto's  . Gastric bypass   . Thyroidectomy 2008    left  . Fetal surgery for congenital hernia   . Craniectomy suboccipital w/ cervical laminectomy / chiari 05/2010    repair, Dr.Pool    Family History  Problem Relation Age of Onset  . Arthritis Mother   . Coronary artery disease Father   . Coronary artery disease Sister   . Cancer Maternal Grandmother     Ovarian    History   Social History  . Marital Status: Divorced    Spouse Name: N/A    Number of Children: 3  . Years of Education: N/A   Occupational History  . 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 Smoking status: Never Smoker   . Smokeless tobacco: Never Used  . Alcohol Use: Yes     rare  . Drug Use: No  . Sexually Active: Not on file   Other Topics Concern  . Not on file   Social History  Narrative   Still fighting over child supportSSI disabilitiy due to multiple DVT'sPart time job in middle school cafeteriaGoing back to school at [employer]   Review of Systems     Objective:   Physical Exam  Psychiatric: Her behavior is normal. Judgment and thought content normal.       Mildly anxious Not depressed          Assessment & Plan:

## 2011-04-30 NOTE — Patient Instructions (Signed)
Keep your regular follow up

## 2011-05-05 NOTE — Op Note (Signed)
NAMEITALY, WARRINER              ACCOUNT NO.:  [id number]   MEDICAL RECORD NO.:  [id number]          PATIENT TYPE:  OIB   LOCATION:  5731                         FACILITY:  [facility]   PHYSICIAN:  Zola Button T. Lazarus Salines, M.D. DATE OF BIRTH:  [birth year]   DATE OF PROCEDURE:  08/17/2007  DATE OF DISCHARGE:                               OPERATIVE REPORT   PREOPERATIVE DIAGNOSIS:  Left thyroid mass.   POSTOPERATIVE DIAGNOSIS:  Left thyroid follicular neoplasm.   PROCEDURE PERFORMED:  Left thyroid lobectomy with the isthmusectomy.   SURGEON:  Gloris Manchester. Lazarus Salines, M.D.   ASSISTANT:  Kinnie Scales. Annalee Genta, M.D.   ANESTHESIA:  General orotracheal.   ESTIMATED BLOOD LOSS:  25 mL.   COMPLICATIONS:  None.   FINDINGS:  A small but nodular right thyroid lobe.  A semi spherical  left thyroid lobe approximately 6 cm in greatest dimension, currently  the entire lobe replaced with a neoplastic mass.  No significant  adhesions.  No adenopathy in the anterior chamber.  No parathyroid  glands or recurrent laryngeal nerve clearly identified, but avoided in  the dissection.  Not malignant by frozen section.   DESCRIPTION OF PROCEDURE:  With the patient in the comfortable supine  position, general orotracheal anesthesia was induced without difficulty.  At an appropriate level, the patient was placed in a slight sitting  position.  A shoulder roll was placed and the neck was extended for  access to the lower neck.  The previously identified skin wrinkle was  marked transversely and infiltrated with 1% Xylocaine with 1:100,000  epinephrine, 9 mL total.  A sterile preparation and draping of the  entire and upper chest was performed.   The sternal notch was marked.  The thyroid notch was marked and an 8 cm  incision was planned and a central crosshatch was placed for later  orientation.  This was carried down through skin and subcutaneous tissue  sharply and then through the deeper subcutaneous tissues and  platysma  muscle using the cautery.  Subplatysmal planes were elevated superiorly  and inferiorly up to the thyroid notch, down to the sternal notch and  over to the sternocleidomastoid muscle on both sides.  These were  secured with the May-Horner retractor.  The tumor appeared to be bulging  between the strap muscles and the fat in the midline was resected to  make sure that we had  a proper margin.  Upon removing this fat, the  muscles were still apposed to the midline.  The midline raphe was  divided and muscles were separated from the thyroid gland in two layers.  The mass replaced to the left thyroid lobe and most of the isthmus and  dissection over towards the right lobe finally revealed the edge of the  mass, and at this point the pretracheal plane was entered and the  thyroid was isolated between hemostats, divided and controlled with 2-0  silk suture ligatures.  Working up the superior pole, dissecting  directly on the capsule of the gland, vessels were identified and  controlled with Ligaclips and with silk ligature.  This was carried  up  to the superior pole and then rolled around the pole onto the posterior  edge beginning superiorly.  The same dissection was worked from the  isthmus down along the inferior lobe and the trachea and the thyroid was  dissected from the cricoid and trachea and rolled towards the left.  Under direct vision, the posterolateral aspect of the dissection along  the lobe was carried directly on the capsule, again using Ligaclips and  silk ligatures.  The post inferior pole was rolled upward and  controlled.  One possible parathyroid was identified and preserved.  Finally the dissection remained pedicle that Berry's ligament.  With  careful dissection, the recurrent nerve was identified in between the  trachea and the carotid artery and was preserved.  Using sharp  dissection,  the gland and Berry's ligament was dissected away from the  nerve and  rolled medially and finally the gland was delivered.  It was  sent off for frozen section interpretation.  A small amount of silk  ligature received hemostasis.  The wound was thoroughly irrigated and  then Valsalva revealed hemostasis.  At this point the lobectomy was  completed.  Wound closure was begun, waiting for the frozen section to  return, with placement of a 7-French Silastic drain which was secured to  the skin with a 3-0 Ethilon stitch, and then closing this raphe of the  strap muscles with 4-0 chromic and closed the platysmal layer with 4-0  chromic.  Upon receiving the frozen section which did not show  malignancy, the skin closure was completed with a running subcuticular 5-  0 Ethilon in a cosmetic fashion.  At this point the procedure was  completed.  The drain was functioning.  The patient was returned to  Anesthesia, awakened, extubated, and transferred to recovery in stable  condition.   COMMENT:  A 50 year old white female with a long history of a thyroid  goiter and a more recent enlargement on the left side with ultrasound  showing a solitary dominant mass on the left side and with needle  aspiration stressing a follicular neoplasm which was several indications  for today's procedure.  Anticipate routine postop recovery with  attention to ice, elevation, analgesia.  Will wait for permanent  pathologic interpretation.  If this comes back as malignant, she will  require a completion thyroidectomy and anterior  compartment dissection.  The neck was carefully palpated after removing the thyroid lobe, but no  adenopathy was identified.      Gloris Manchester. Lazarus Salines, M.D.  Electronically Signed     KTW/MEDQ  D:  08/17/2007  T:  08/18/2007  Job:  [id number]   cc:   Karie Schwalbe, MD

## 2011-05-05 NOTE — Consult Note (Signed)
[facility]                          ENDOCRINOLOGY CONSULTATION   NAME:CABRAL, SCARLETTE HOGSTON                     MRN:          [id number]  DATE:05/02/2007                            DOB:          [birth year]    REASON FOR REFERRAL:  Thyroid mass.   HISTORY OF PRESENT ILLNESS:  A 50 year old woman with many years history  of hypothyroidism.  Because of an elevated TSH, her Synthroid was  increased to 100 mcg a day.  In approximately 2002, she had a biopsy of  a large midline thyroid mass.  However, she feels the nodule may be  growing.  She states recently she is having some slight dysphagia, worse  with solids and wants to know is this thyroid related.   PAST MEDICAL HISTORY:  1. Gastric bypass surgery some years ago.  2. Allergic rhinitis.  3. She has an apparent hypercoagulable state for which she takes      Coumadin but this has apparently been discontinued.  4. She also takes Prevacid for GERD.  5. She has some depression/anxiety.   SOCIAL HISTORY:  She is disabled.  And, her marital status is classified  as separated.   FAMILY HISTORY:  Negative for thyroid disease.   REVIEW OF SYSTEMS:  Denies cramps and she denies shortness of breath.   PHYSICAL EXAMINATION:  VITAL SIGNS:  Blood pressure 105/64, heart rate  is 63, temperature is 98.2, weight is 240.  GENERAL:  Obese, no distress.  SKIN:  No rash.  Not diaphoretic.  HEENT:  No proptosis.  No periorbital swelling.  NECK:  There is a 5-cm diameter midline thyroid mass which is mobile.  CHEST:  Clear to auscultation.  No respiratory distress.  CARDIOVASCULAR:  Trace bilateral pretibial edema.  Regular rate and  rhythm.  No murmur.  NEUROLOGIC:  Alert and oriented.  She is anxious during her examination.  Not depressed.  Sensation is intact to touch throughout.  And muscle  bulk and tone appear to be normal.   LABORATORY STUDIES:  Forwarded by Dr. Alphonsus Sias, on December 09, 2006, TSH  0.5.  On  October 15, 2006, calcium 8.5, albumin 3.5.   IMPRESSION:  1. Chronic hypothyroidism.  2. Midline thyroid nodule which the patient states may be increasing      in size.  3. Question of dysphagia in a patient with a history of      gastroesophageal reflux disease.  4. Incidental note is made of her borderline low calcium.  In a      patient with a history of gastric bypass surgery, secondary      hyperparathyroidism is likely.   PLAN:  1. Biopsy of the nodule done today in the office.  2. Same Synthroid as prescribed by Dr. Alphonsus Sias.  3. Referral back to gastroenterology for her difficulty swallowing      could be considered.  4. A parathyroid hormone level should be checked in this patient the      next time she is having labs drawn.   PROCEDURE:  Thyroid nodule biopsy.  Risks, benefits, alternatives  explained to the patient.  She signs the consent form.   LOCAL:  Xylocaine 2% with epinephrine.  Prep:  Betadine.   PROCEDURE:  A 27-gauge needle is done for four biopsies and sent to  cytology.  No complications.   On the date of this dictation, May 05, 2007, the result of the cytology  of the biopsy is known and it is best classified as inadequate.  I have  called the patient and we have discussed the matter by phone and she  will come in for a repeat biopsy.  On that occasion, I will use  different size needles and take more biopsies in more areas of the  nodule.     Sean A. Everardo All, MD     SAE/MedQ  DD: 05/05/2007  DT: 05/05/2007  Job #: [id number]   cc:   Karie Schwalbe, MD

## 2011-05-05 NOTE — Assessment & Plan Note (Signed)
[facility]                            CARDIOLOGY OFFICE NOTE   NAME:[name], [name]                     MRN:          [id number]  DATE:06/29/2007                            DOB:          Mar 06, 1961    REFERRING PHYSICIAN:  Karie Schwalbe, MD   REASON FOR VISIT:  Follow up history of venous thrombosis.   HISTORY OF PRESENT ILLNESS:  This is my first meeting with [name].  She is a former patient of Dr. Samule Ohm, who last saw her in April of this  year.  She is referred back to [place] with specifically the question as to  whether she has a true hypercoagulable state, and whether or not she  should be a candidate for longterm anticoagulation.  I reviewed her  chart today, and spoke with her about her history.  My understanding is  that she had a calf venous thrombosis that occurred during pregnancy  with her 2nd child.  She states that she fell off some steps during that  pregnancy, and subsequently developed the thrombosis.  She was treated  with heparin through that pregnancy, and then transitioned to Coumadin,  which she reports that she stayed on for the next 2 years.  It was in  her 3rd pregnancy that she was also treated with heparin, and then  ultimately underwent an emergent caesarian section.  She states that she  had to come back to the hospital within a few days, and was diagnosed at  that time with a deep venous thrombosis.  She was again placed on  Coumadin.  She reports that later on in 2004 she underwent gastric  bypass surgery, and at that time a prophylactic inferior vena cava  filter was placed given her history of thrombosis.  She was also  continued on Coumadin thereafter.  She apparently had no complications  at that time.  She has been followed through our Coumadin Clinic, and  also by Dr. Samule Ohm for this problem.   In reviewing her chart, she did have a hypercoagulability workup done  initially in April 2007.  The cardiolipin  IgG antibody was moderately  positive at 30, cardiolipin IgM antibody was inconclusive at 14,  cardiolipin IgA antibody was negative at 3.  Antithrombin III level was  low at 63, functional protein C was normal at 97 with a borderline low  total protein C of 62, functional and total protein S was normal at 89  and 82 respectively, lupus anticoagulant was not detected, homocysteine  was normal at 10, factor V Leiden mutation was negative, prothrombin II  gene mutation was negative, PT was 15.6, INR 1.2, and PTT was 33.  TSH  was also normal at 4.5.  Based on this, and in reviewing Dr. Melinda Crutch  prior notes, Coumadin was continued, although the hypercoagulability  workup was not entirely conclusive.  Subsequent notes indicate a concern  by Dr. Samule Ohm that [name] was not being entirely compliant with her  Coumadin based on several presentations with subtherapeutic INR levels.  I ultimately note that Dr. Samule Ohm discontinued Coumadin altogether  following  his visit in April 2008.   [name] is now diagnosed with a thyroid mass with follicular pattern  based on needle aspiration, and she is being considered for at least a  left thyroid lobectomy, possible total thyroidectomy.  She has not had  any obvious recurrent deep venous thromboses in the last few years, and  has had no major new leg discomfort or problems in the last few months.  She does have previously documented residual thrombus in the left  popliteal vein with recanalization.  Her IVC filter has been noted to be  widely patent with no evidence of thrombus based on studies from April  2007.  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 Donia Ast.  I am not certain that she has had any  specific interventions for this, however.   ALLERGIES:  1. LATEX.  2. TETRACYCLINE.  3. MUSHROOMS.  4. BEER.   PRESENT  MEDICATIONS:  1. Multivitamin once daily.  2. Norethindrone 0.1 mg daily.  3. Zoloft 150 mg p.o. daily.  4. Prevacid 30 mg p.o. t.i.d.  5. Benefiber tablets daily p.r.n.  6. Allegra 180 mg p.o. daily.  7. Synthroid 100 mcg p.o. daily.   REVIEW OF SYSTEMS:  As described in history of present illness.  She has  had no chest pain or progressive breathlessness.   EXAMINATION:  Blood pressure 114/80.  Heart rate 58.  Weight is 230  pounds.  This is an overweight woman in no acute distress.  No chest pain or labored breathing at rest.  HEENT:  Normal.  NECK:  Supple.  No elevated jugular venous pressure.  Thyroid  fullness/mass as noted.  This is nontender.  No loud carotid bruits.  LUNGS:  Clear.  Somewhat diminished breath sounds.  No labored  breathing.  CARDIAC:  Exam reveals a regular rate and rhythm.  No loud murmur or S3  gallop.  ABDOMEN:  Soft.  EXTREMITIES:  Exhibit chronic varicosities, left greater than right.  Some venostasis.  Trace to 1+ edema.  Distal pulses are 1 to 2+.  SKIN:  No ulcerative changes are noted.   IMPRESSION AND RECOMMENDATIONS:  1. History of recurrent venous thrombosis as outlined above.  Status      post placement of an inferior vena cava filter in 2004.  [name]      has been maintained on chronic Coumadin therapy until April of this      year, when this was discontinued by Dr. Samule Ohm.  She has had a      hypercoagulability workup as outlined above that in large part is      somewhat equivocal.  We discussed these issues today, and my      recommendation is for her to undergo a formal Hematology      consultation to better understand whether she should truly be      considered as having a clear cut hypercoagulable state that      requires longterm Coumadin or not.  It is difficult for me to make      this determination at this time.  She does have an inferior vena      cava filter in place, and is therefore somewhat protected in the      event  that she had a lower extremity or pelvic thrombosis, although      I still am  concerned about her risk for recurrent events,      particularly with an apparent diagnosis of thyroid cancer.      Clearly, in the perioperative setting, whether or not she is      maintained on longterm Coumadin, she should have basic deep venous      thrombosis prophylaxis.  Lovenox would be a reasonable      consideration for her.  I would like to have her see Hematology for      more definitive recommendations regarding Coumadin longterm, and if      this is felt to be the best option, we can have her reestablished      in the Coumadin Clinic with followup from there.  I will plan to      forward the hypercoagulability studies for her consultation.  It      may be that some of these need to be repeated.  I presume that the      original studies were done off Coumadin, but cannot confirm this.  2. Further plans to follow.     Jonelle Sidle, MD  Electronically Signed    SGM/MedQ  DD: 06/29/2007  DT: 06/29/2007  Job #: [id number]   cc:   Karie Schwalbe, MD  Gloris Manchester. Lazarus Salines, M.D.

## 2011-05-05 NOTE — Assessment & Plan Note (Signed)
[facility]                            CARDIOLOGY OFFICE NOTE   NAME:CABRAL, IBETH FAHMY                     MRN:          [id number]  DATE:08/09/2007                            DOB:          10/16/1961    REASON FOR VISIT:  Routine followup.   HISTORY OF PRESENT ILLNESS:  I met [name] for an office visit back  in July.  Her history is detailed in my previous note.  I referred her  for a formal hematology consultation for better assessment of potential  hypercoagulable state with previously documented thromboembolic disease  and a question of a need for long-term anticoagulation.  She saw Dr.  Welton Flakes in late July, who provided a very complete consultation note for my  review.  My understanding is that the patient is to undergo repeat  hypercoagulable workup off Coumadin with more definitive plans to  follow.  The patient is scheduled for thyroidectomy due to thyroid  cancer later in the month.  She reports followup with Dr. Welton Flakes to  proceed this surgery.  [name] has been stable.  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  She sought treatment in the past with Dr. Donia Ast in  [facility], but has not had any specific therapy as of  yet.  I have asked her to defer on any specific treatments on this in  light of the present evaluation and pending surgery.   ALLERGIES:  LATEX, TETRACYCLINE, MUSHROOMS, BEER.   PRESENT MEDICATIONS:  1. Multivitamin daily.  2. Prevacid 30 mg p.o. t.i.d.  3. Allegra 180 mg p.o. daily.   REVIEW OF SYSTEMS:  As described in the history of present illness.   EXAMINATION:  Blood pressure today 101/67, heart rate is 62, weight is  273 pounds.  The patient is comfortable and in no acute distress.  Examination of the neck reveals no elevated jugular venous pressure.  CARDIAC:  Regular rate and rhythm.  No S3 gallop.  LUNGS:  Diminished with no  labored breathing or wheezing.  EXTREMITIES:  Show chronic varicosities, left greater than right.  Venous stasis.  Trace edema.  Distal pulses are 1+.   IMPRESSION/RECOMMENDATIONS:  1. History of thromboembolic disease.  The patient is presently being      evaluated by Dr. Drue Second from a hematology perspective, and is      undergoing repeat hypercoagulable workup evaluation at this time.      Hopefully, this will solidify whether or not the patient requires      long-term anticoagulation, and plans can be made from there.  She      has followup with Dr. Welton Flakes on August 26 followed by thyroidectomy.      We can certainly see her back for      Coumadin management long-term if this is the final decision.      Otherwise, as needed.  I will defer care of her hypercoagulable      workup to Dr. Milta Deiters expertise.     Jonelle Sidle, MD  Electronically  Signed    SGM/MedQ  DD: 08/09/2007  DT: 08/10/2007  Job #: [id number]   cc:   Karie Schwalbe, MD  Gloris Manchester. Lazarus Salines, M.D.  Drue Second, MD

## 2011-05-08 NOTE — Op Note (Signed)
NAMEREIZEL, CALZADA              ACCOUNT NO.:  [id number]   MEDICAL RECORD NO.:  [id number]          PATIENT TYPE:  OIB   LOCATION:  1009                         FACILITY:  [facility]   PHYSICIAN:  Sharlet Salina T. 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   PROCEDURE:  Laparoscopic repair of internal hernia at jejunojejunostomy.   SURGEON:  Lorne Skeens. Hoxworth, M.D.   ASSISTANT:  Alfonse Ras, M.D.   ANESTHESIA:  General.   BRIEF HISTORY:  [name] is a 50 year old female status post  laparoscopic Roux-en-Y gastric bypass over 2 years ago in [place].  She has  lost over 200 pounds.  Since approximately a year to a year and a half  postoperatively, she has been having intermittent episodes of mid and upper  abdominal pain and nausea. She was hospitalized recently at [facility]  after a laparoscopic hysterectomy with a similar episode of pain and CT scan  was obtained which shows an apparent internal hernia with swirling of the  small bowel mesentery and some dilated loops of small bowel in the left  upper quadrant. This episode resolved and I have evaluated her as an  outpatient with continued episodic pain. She has had a negative endoscopy.  She is felt to have an internal hernia and laparoscopic repair has been  recommended.  The procedure, its indications, risks of bleeding, infection, possible need  for open procedure were discussed and understood. She is now brought to the  operating room for this procedure.   DESCRIPTION OF PROCEDURE:  The patient was brought to the operating room,  placed in supine position on the operating table and general endotracheal  anesthesia was induced. She received preoperative antibiotics and 40 mg  Lovenox subcutaneously. PAS were in place.  Foley catheter was placed.  The  abdomen was widely sterilely prepped and draped. An open Roseanne Reno technique  was used just below the umbilicus. A 10 mm trocar placed through mattress  suture of Vicryl and pneumoperitoneum established without difficulty. Under  direct vision an 11 and 5 mm trocar were placed in the right mid abdomen and  the right upper quadrant and a 5 mm trocar in the left upper abdomen. There  was no obviously dilated bowel. The Roux limb was seen in the anticolic  position going up through the gastric pouch. This was initially traced down  to the jejunojejunostomy without any evidence of Peterson defect.  From the  jejunojejunostomy, I then traced the common channel distally and we were  unable to get this traced completely down to the ileocecal valve as this  appeared behind the mesentery possibly through the internal hernia.   We then identified the cecum and the ileocecal valve traced this back  proximally. This was traced down all the way to the jejunojejunostomy which  at this point  was elevated and there was seen to be hernia defect behind  this measuring about 4 cm in diameter. We felt that we had likely reduced  the small bowel through this hernia defect running it back up proximally,  although again there was no obviously obstructed bowel. There had been a  couple of somewhat inflammatory adhesions around the pelvis, around the  terminal ileum from previous hysterectomy and these had been lysed.   Following this, the other limb from the jejunojejunostomy was traced  proximally and indeed it was the biliopancreatic limb which was traced back  to the ligament of Treitz. The Roux limb was again followed retrograde back  to the gastric pouch and appeared normal and there was no Peterson's defect  hernia. Following this, the hernia through the  mesentery of the  jejunojejunostomy was closed with a running 2-0 silk suture. At this point  the abdomen was inspected for hemostasis which appeared complete. Trocars  removed under direct vision and all CO2 evacuated. Mattress suture was  secured at the umbilicus. Skin incisions were closed with interrupted  subcuticular 4-0 Monocryl and Steri-Strips.  Sponge, needle, and instrument  counts were correct. The patient was taken to recovery in good condition.      Lorne Skeens. Hoxworth, M.D.  Electronically Signed     BTH/MEDQ  D:  01/04/2006  T:  01/05/2006  Job:  [id number]

## 2011-05-08 NOTE — Op Note (Signed)
[name], [name]              ACCOUNT NO.:  [id number]   MEDICAL RECORD NO.:  [id number]          PATIENT TYPE:  OBV   LOCATION:  9317                          FACILITY:  [facility]   PHYSICIAN:  Ginger Carne, MD  DATE OF BIRTH:  [birth year]   DATE OF PROCEDURE:  10/30/2005  DATE OF DISCHARGE:                                 OPERATIVE REPORT   PREOPERATIVE DIAGNOSES:  Menometrorrhagia and genuine urinary stress  incontinence.   POSTOPERATIVE DIAGNOSES:  1.  Menometrorrhagia and genuine urinary stress incontinence.  2.  Appendiceal endometriosis.   PROCEDURE:  1.  Laparoscopic assisted vaginal hysterectomy.  2.  Bilateral salpingo-oophorectomy.  3.  Tension-free vaginal tape procedure with cystoscopy.  4.  Appendectomy.   SURGEON:  Dr. Mia Creek   ASSISTANT:  None.   COMPLICATIONS:  None immediate.   ESTIMATED BLOOD LOSS:  1200 mL.   SPECIMEN:  Uterus, cervix, right and left tube and ovary.   OPERATIVE FINDINGS:  External genitalia, vulva, and vagina are normal.  Cervix smooth without erosions or lesions.  The uterus appeared to be  approximately 10 weeks' in size with significant adhesive disease of the  rectosigmoid to the left adnexa.  The patient demonstrated endometriotic  lesions on the posterior aspect of the broad ligaments and posterior aspect  of the uterus.  The right and left tubes and ovaries demonstrated  endometriotic lesions as well.  Her appendix had endometriotic lesions on  its surface.  Large and small bowel were grossly normal.  There was a  generalized oozing related to prophylaxis with Lovenox preoperatively.  This  accounted for her blood loss.  It was particularly troublesome along the  right uterine vasculature.  Indigo carmine cystoscopy performed during the  course of the TED which demonstrated good spillage of dye through the  ureteral orifices bilaterally.  All bleeding was satisfactorily controlled  at the end of the procedure.   OPERATIVE PROCEDURE:  The patient prepped in the usual fashion and placed in  the lithotomy position, Betadine solution used for antiseptic, and the  patient was catheterized prior to the procedure.  After adequate general  anesthesia, a tenaculum placed on the anterior lip of the cervix and a  Pelosi uterine manipulator placed on the same.  Afterwards, a vertical  infraumbilical incision was made and a Veress needle placed in the abdomen,  and opening and closing pressures were 10-15 mmHg.  Needle released, trocar  placed in the same incision, laparoscope placed through the trocar sleeve.  A probe was then placed in the left lower quadrant and left hypogastric  regions under direct visualization.  The first portion of the procedure was  to dissect the rectosigmoid colon off the left adnexal.  This was carefully  performed, not injuring bowel.  Afterwards, the ureters were identified  bilaterally in the pelvis, and both infundibulopelvic ligaments were bipolar  cauterized and cut including their respective round ligaments.  Afterwards,  attention was directed to the appendectomy.  The mesoappendix was bipolar  cauterized at the base, 2-0 Vicryl sutures placed at the base, another one  about 8 mm  above the first two loops.  Appendix was then severed above the  first two loops, placed in an Endopouch bag, and removed appropriately.  Copious irrigation with lactated Ringer's followed at the base of the  appendix and irrigant removed.  At this point, the attention was directed to  the vaginal portion of the procedure.  After placing double tooth tenaculum  on the anterior and posterior lips of the cervix, Marcaine with epinephrine  was injected circumferentially around the cervix, 2 cm of anterior and  posterior vaginal epithelium incised transversely.  The peritoneal  reflections were identified, opened without injury to their respective  organs, uterosacral cardinal ligament complexes clamped,  cut, and ligated  with 0 Vicryl suture.  This extended to the uterine vasculature and its  ascending branches.  Afterwards, the uterus, cervix, both tubes and ovaries  were removed; broad ligaments were also appropriately clamped, cut, and  ligated with 0 Vicryl suture.  The cuff was closed with 0 Vicryl running  interlocking suture and the patient reinspected laparoscopically.  There was  an aforementioned area of oozing where the right uterine pedicles were  noted.   Afterwards, the TVT portion of the procedure was performed.   A vertical incision was made in the anterior vaginal epithelium.  The  pubovesical cervical fascia was then appropriately dissected off the vaginal  wall.  Space of Retzius identified and using a bottom-up TVT system, [employer] product, tape was placed hugging the back of the pubic bone and  emanating about 1-2 cm lateral from the symphysis pubis.  This was placed on  either side without difficulty.  Afterwards a cystoscopy performed.  No  injury or violation of bladder noted and as aforementioned, the indigo  carmine dye demonstrated good spillage through both ureteral orifices.  Afterwards, the bladder was emptied and filled to 250 mL and clamped.  The  tape was then appropriately adjusted.  Plastic sheath is removed.  Bleeding  points hemostatically checked in closure of the vaginal epithelium with 2-0  Vicryl running interlocking suture.  Rescoping of the patient demonstrated  no evidence for active bleeding.  Copious irrigation again followed and  irrigated removed.  Gas released, trocars removed.  Closure of the 10 mm  fascia site with 0 Vicryl suture and 4-0 Vicryl for the subcuticular  closures include including Durabond.  The patient tolerated the procedure  well and returned to the postanesthesia recovery room in excellent  condition.      Ginger Carne, MD  Electronically Signed    SHB/MEDQ  D:  10/30/2005  T:  10/30/2005  Job:   [id number]

## 2011-05-08 NOTE — Assessment & Plan Note (Signed)
[facility]                              CARDIOLOGY OFFICE NOTE   NAME:CABRAL, CORIANNA AVALLONE                     MRN:          [id number]  DATE:07/01/2006                            DOB:          1961-08-23    HISTORY OF PRESENT ILLNESS:  Ms. 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  She is on chronic Coumadin therapy with which she  says she has been compliant.  However, INR was 1.1 yesterday and confirmed  to be 1.0 today.   She presents to Coumadin clinic for routine INR check.  While there, she  complained of left leg discomfort for a few days.  She says she feels as if  there is a lump in her left groin and has had a feeling of fullness in her  left leg.  She has not noted it to be swollen or erythematous.  There have  been no symptoms in the right leg.   Her current medications are multivitamin, Coumadin, Zoloft, __________,  Prevacid, Benefiber, and Allegra.   On physical examination, she has no mass palpable in her left groin.  The  femoral pulse is 2+ without bruit.  There is no tenderness over the femoral  vein and no associated erythema.  She has no cord throughout her leg.  There  is perhaps trace edema in both legs.  It is certainly not worse on the left  than the right.  She has no cords.   IMPRESSION/RECOMMENDATIONS:  Leg pain in the setting of history of deep  venous thrombosis and subtherapeutic INR.  She has no signs suggestive of  recurrent deep venous thrombosis.  Will continue her Coumadin with  adjustment to get her to therapeutic.  I am concerned that her INR is only  1.0 today.  I think this probably implies noncompliance.  I spoke with her  about this and emphasized to her the critical importance of compliance if  the plan is that she stay on it over the long haul.                                 Salvadore Farber, MD    WED/MedQ  DD:  07/01/2006  DT:  07/01/2006  Job #:  [id number]   cc:   Karie Schwalbe, MD

## 2011-05-08 NOTE — Discharge Summary (Signed)
[name], [name]              ACCOUNT NO.:  [id number]   MEDICAL RECORD NO.:  [id number]          PATIENT TYPE:  OBV   LOCATION:  9317                          [facility]  [facility]   PHYSICIAN:  Ginger Carne, MD  DATE OF BIRTH:  10-30-1961   DATE OF ADMISSION:  10/30/2005  DATE OF DISCHARGE:  11/01/2005                                 DISCHARGE SUMMARY   REASON FOR HOSPITALIZATION:  1.  Menometrorrhagia.  2.  Genuine urinary stress incontinence.  3.  Anemia of chronic blood loss.  4.  In hospital for seizures.   FINAL DIAGNOSES:  1.  Menometrorrhagia.  2.  Genuine stress urinary incontinence.  3.  Anemia of chronic blood loss.  4.  In hospital for seizures.   PROCEDURES:  Laparoscopic assisted vaginal hysterectomy with bilateral  salpingo-oophorectomy. Tension free vaginal tape procedure and cystoscopy.   HOSPITAL COURSE:  This patient is a 50 year old Caucasian female who  underwent the aforementioned procedures on October 30, 2005. The patient  had appropriate work up for her anemia of chronic blood loss, genuine  urinary stress incontinence and menometrorrhagia. She had undergone a  colonoscopy on October 21, 2005 and received 4 units of packed red blood  cells to bring her hemoglobin to 9.8 in the office 2 days prior to her  surgery. The patient had a hemoglobin of 5.6 approximately 1 month ago. At  the time of her admission the patient had a hemoglobin of 9 and hematocrit  of 29.4.   The patient's intraoperative course was unremarkable. Postoperatively she  did well. The patient received 2 units of packed red blood cells  postoperatively. Postoperative hemoglobin was 8.4, hematocrit 29.5. The  patient was receiving Lovenox 40 mg subcutaneously preoperatively and her  first 2 days postoperatively for prophylaxis of deep venous  thrombosis/embolism. The patient had a generalized oozing as a result of  Lovenox which caused additional blood loss in the range of 1200  cc. There  were no significant active bleeding points but a generalized oozing,  particularly around the uterine vascular sites. Indigo carmine insufflation  was performed at the end of the procedure intraoperatively which revealed  bilaterally patent ureteral orifices.   The patient was afebrile, voided well after her Foley catheter was removed.  Incisions were dry, minimal vaginal flow. Calves without tenderness, lungs  were clear. The patient was discharged with routine postoperative  instructions including contacting the office for temperature elevation above  100.4 degrees Fahrenheit, increasing abdominal pain, incisional drainage or  pain. Increased vaginal bleeding, constipation or genitourinary complaints.  She was advised to be active to avoid the risk of deep venous thrombosis.   DISCHARGE MEDICATIONS:  1.  Percocet 5/325 1-2 q.4-6h was prescribed.  2.  Levaquin 500 mg x7 days also prescribed.   She was advised to have time voids every 4 hours for the first 4 weeks. She  will return in 4 weeks for her postoperative check up.      Ginger Carne, MD  Electronically Signed     SHB/MEDQ  D:  11/01/2005  T:  11/01/2005  Job:  [id number] 

## 2011-05-08 NOTE — H&P (Signed)
[name], [name]              ACCOUNT NO.:  [id number]   MEDICAL RECORD NO.:  [id number]          PATIENT TYPE:  INP   LOCATION:  9320                          [facility]ILITY[facility]  [facility]   PHYSICIAN:  James A. Ashley Royalty, M.D.DATE OF BIRTH:  [birth year]   DATE OF ADMISSION:  11/12/2005  DATE OF DISCHARGE:                                HISTORY & PHYSICAL   HISTORY OF PRESENT ILLNESS:  The patient is a 50 year old, gravida 3, para 1-  0-2-3, with a chief complaint of abdominal pain.  The patient is status post  surgery at [facility] by Dr. Mia Creek on or about October 31, 2005,  consisting of laparoscopically-assisted vaginal hysterectomy, bilateral  salpingo-oophorectomy, tension-free vaginal tape procedure, and laparoscopic  appendectomy.  The patient has also had a laparoscopic gastric bypass  procedure performed in [place] in 2004.  She also has a history two deep  venous thromboses and is status post placement of a Greenfield filter.  The  patient states her discomfort is located in the lower abdomen and is equal  bilaterally.  However, it does radiate above her umbilicus to the inferior  border of the sternum.  She goes on to say that it comes in waves and was  much worse prior to coming to the Maternity Admissions Unit and now is much  improved.  She seems to be notice some improvement with sitting and a gentle  rocking motion.  She states she has had this type of pain before after her  gastric bypass procedure but previously it was not quite as pronounced.  She  states since her gastric bypass procedure, she has been unable to have any  solid bowel movements.  Since her recent gynecologic procedure, she has had  only minimal bowel movements and has had several episodes of nausea.  She  denies any fever, significant bleeding, or voiding difficulties.   MEDICATIONS:  1.  Dulcolax.  2.  Prevacid 1 p.o. before meals.  3.  Hydrocodone for pain.  4.  The patient was on Lovenox  during her hospitalization, which was      completed on or about November 11, 2005.   PAST MEDICAL HISTORY:  1.  Deep venous thromboses as above.  2.  Migraine headaches.  3.  History of nodular goiter; no medications required at this time for      that.   PAST SURGICAL HISTORY:  1.  As above.  2.  History of open cholecystectomy.  3.  Repair of deviated septum.   ALLERGIES:  1.  LATEX.  2.  TETRACYCLINE.   FAMILY HISTORY:  Noncontributory.   SOCIAL HISTORY:  The patient denies the use of tobacco or significant  alcohol.   REVIEW OF SYSTEMS:  Noncontributory.   PHYSICAL EXAMINATION:  GENERAL:  A well-developed, well-nourished, pleasant  white female in no acute distress.  VITAL SIGNS:  Afebrile, pulse 51, respirations 32, blood pressure 126/63.  HEENT:  Normocephalic.  Poor dentition.  NECK:  Supple without thyromegaly.  CHEST:  Lungs are clear with infrequent rhonchi.  No rales.  BREASTS:  Deferred.  CARDIAC:  Regular  rate and rhythm.  ABDOMEN:  Soft and minimally tender to direct palpation.  There is no severe  tenderness or rebound tenderness.  Bowel sounds are hypoactive but present.  MUSCULOSKELETAL:  No CVA tenderness.  PELVIC:  External genitalia within normal limits.  Vagina is without gross  lesions.  The vaginal cuff appears to be healing well.  Bimanual examination  reveals surgical absence of the uterus.  I can appreciate no pelvic masses  or other abnormalities.   LABORATORY DATA:  CBC:  Hemoglobin 10.6, white blood count 10,500.  Flat and  upright films of the abdomen revealed a preliminary report suggesting  distended small bowel loop in abdomen, probable focal ileus, though early  obstruction could not be excluded.   IMPRESSION:  1.  Status post laparoscopically-assisted vaginal hysterectomy, bilateral      salpingo-oophorectomy, tension-free vaginal tape procedure, and      appendectomy on or about October 30, 2005.  2.  Status post gastric bypass  operation several years ago.  3.  History of deep venous thromboses - status post placement of Greenfield      filter.  4.  Migraine headaches.  5.  History of nodular goiter.  6.  Gastroesophageal reflux disease.  7.  Anemia of chronic disease.  8.  Pelvic/abdominal pain - etiology most consistent with ileus.      Differential also includes similar exacerbation of pain this patient has      had in the past post gastric bypass surgery independent of her recent      gynecologic surgery.   PLAN:  Will admit for observation, IV hydration, and attempts to improve  colonic function.      James A. Ashley Royalty, M.D.  Electronically Signed     JAM/MEDQ  D:  11/12/2005  T:  11/13/2005  Job:  [id number]   cc:   Ginger Carne, MD  Fax: [phone]

## 2011-05-08 NOTE — Discharge Summary (Signed)
[name], [name]              ACCOUNT NO.:  [id number]   MEDICAL RECORD NO.:  [id number]          PATIENT TYPE:  INP   LOCATION:  9320                          [facility]  [facility]   PHYSICIAN:  James A. Ashley Royalty, M.D.DATE OF BIRTH:  06-08-1961   DATE OF ADMISSION:  11/12/2005  DATE OF DISCHARGE:  11/14/2005                                 DISCHARGE SUMMARY   DISCHARGE DIAGNOSES:  1.  Status post laparoscopically assisted vaginal hysterectomy, bilateral      salpingo-oophorectomy, tension to create vaginal tape procedure,      appendectomy on/or about October 30, 2005 per Dr. Mia Creek.  2.  Status post gastric bypass operation several years ago.  3.  History of deep venous thrombosis-status post placement of Greenfield      filter.  4.  Migraine headache.  5.  History of nodular goiter.  6.  Gastroesophageal reflux disease.  7.  Anemia of chronic disease.  8.  Pelvic/abdominal pain-probably ileus, improved.   OPERATIONS AND SPECIAL PROCEDURES:  None.   DISCHARGE MEDICATIONS:  Vicodin.   HISTORY AND PHYSICAL:  This is a 50 year old gravida 3, para 1-0-2-3, who  presented while I was on call complaining of abdominal pain.  She was status  post the aforementioned surgical procedure by Dr. Mia Creek on/or about  October 31, 2005.  She complained of discomfort located in the lower  abdomen, equal bilaterally.  She stated it was much worse prior to coming to  maternity admissions unit, but was markedly improved by the time of her  presentation there.  Since her most recent gynecologic procedure, she stated  she had had only minimal bowel movements and several episodes of nausea.  She denied other difficulties.  For the remainder of the history and  physical, please see chart.  The patient was admitted for observation, IV  hydration, and attempts to improve colonic function.   HOSPITAL COURSE:  The patient was admitted to the [facility]  [place] [facility].  Admission laboratory  studies were drawn.  On November 13, 2005,  she was noted to be markedly improved.  On November 14, 2005, she was seen  by Dr. Sydnee Cabal and denied any pain whatsoever.  She stated the desire to  be discharged home.  Physical examination was benign per Dr. Sydnee Cabal.  The  patient was felt to be stable for discharge and was discharged home  afebrile, and in satisfactory condition.   ACCESSORY CLINICAL FINDINGS:  Hemoglobin and hematocrit on admission were  10.6 and 33, respectively.  White blood count was 10,500.  Random glucose  was 117.  KUB of the abdomen revealed dilated small bowel loop consistent  with a focal ileus.  Chest x-ray revealed mild peribronchial thickening  without focal airspace disease.  KUB of the abdomen was repeated on November 14, 2005 and revealed an  interval decrease in small bowel distention consistent with a non-  obstructive pattern.   DISPOSITION:  The patient is to return to Dr. Mart Piggs office as  previously scheduled.  She is to call sooner p.r.n.      James A. Ashley Royalty, M.D.  Electronically Signed     JAM/MEDQ  D:  12/10/2005  T:  12/10/2005  Job:  [id number]   cc:   Ginger Carne, MD  Fax: [phone]

## 2011-05-08 NOTE — Progress Notes (Signed)
[facility]                        PERIPHERAL VASCULAR OFFICE NOTE   NAME:[name] [name] LULL                     MRN:          [id number]  DATE:04/12/2007                            DOB:          05-20-1961    PRIMARY CARE PHYSICIAN:  [name] Schwalbe, MD.   HISTORY OF PRESENT ILLNESS:  Ms. [name] [name] is a 50 year old woman with a  history of one deep venous thrombosis and a separate calf venous  thrombosis. She had an IVC filter placed. She has been on chronic  Coumadin therapy with which she says she is compliant. Specifically, she  tells me she has never missed a dose to her recollection. In contrast,  she told the nurses in our Coumadin clinic that she had missed a couple  of doses in the past week. Her INR is 1.1 today. In July, it was 1.0. It  has rarely been therapeutic in the interim. She has seen Dr. 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 I have received no  communication from him.   CURRENT MEDICATIONS:  Possibly Coumadin, Allegra, Pepcid, Benefiber,  Prevacid, Zoloft, hormone replacement and a multivitamin.   PHYSICAL EXAMINATION:  GENERAL:  She is obese in no distress.  VITAL SIGNS:  Heart rate 64, blood pressure 100/74 and equal  bilaterally. Weight is 234 pounds. She has no jugular venous distention,  thyromegaly or lymphadenopathy.  LUNGS:  Clear to auscultation.  HEART:  She has a nondisplaced point of maximal cardiac impulse. There  is a regular rate and rhythm without murmurs, rubs or gallops.  Respiratory effort is normal.  ABDOMEN:  Obese, soft, nondistended, nontender. There is no  hepatosplenomegaly. Bowel sounds are normal.  EXTREMITIES:  Remarkable for 2+ edema on the right and 2+ on the left  with marked varicosities bilaterally. Legs are nontender.   IMPRESSION/RECOMMENDATIONS:  History of one deep venous thrombosis and  one calf vein thrombosis with IVC filter in place. Indications for  chronic Coumadin  is therefore not absolute. It does not appear that she  is compliant with her Coumadin and/or low potassium diet. I do not think  she is taking her Coumadin as reliably as she tells me she is with her  INR of 1.1. With what appears to be on and off compliance, I think she  is obtaining some risk of bleeding with minimal benefit. In fact, the  transient reduction in protein C and protein S may actually be making  her hypercoagulable when she resumes the Coumadin after periods of being  off it. I have therefore advised that she stop the Coumadin entirely. I  have notified the Coumadin clinic of this. Will not schedule her for  followup here.     Salvadore Farber, MD  Electronically Signed   WED/MedQ  DD: 04/12/2007  DT: 04/12/2007  Job #: [id number]   cc:   [name] Schwalbe, MD

## 2011-05-08 NOTE — H&P (Signed)
[name], [name]              ACCOUNT NO.:  [id number]   MEDICAL RECORD NO.:  [id number]           PATIENT TYPE:   LOCATION:                                 FACILITY:   PHYSICIAN:  Ginger Carne, MD  DATE OF BIRTH:  Nov 24, 1961   DATE OF ADMISSION:  10/30/2005  DATE OF DISCHARGE:                                HISTORY & PHYSICAL   CHIEF COMPLAINT:  Menometrorrhagia and genuine urinary stress incontinence.   HISTORY OF PRESENT ILLNESS:  This patient is a 50 year old gravida 3, para 1-  0-2-3 Caucasian female scheduled for a laparoscopic assisted vaginal  hysterectomy, bilateral salpingo-oophorectomy, tension-free vaginal tape  procedure, and cystoscopy because of the history of menometrorrhagia and  genuine urinary stress incontinence.  The patient over the past four to five  years has noted a worsening of menses, bleeding anywhere from two days to 30  days out of a 30-day cycle.  The patient has been unable to utilize oral  contraceptives due to the history of two past deep venous thromboses.  The  patient states that during the course of her cesarean section at her last  delivery she has endometriosis, although this has never been confirmed  between pregnancies.  Patient takes no medications to enhance her bleeding  propensity and has no personal/family history of bleeding diatheses.  The  patient's hemoglobin was 5.7 at the time of referral in early October 2006.  A colonoscopy performed on November 1 revealed no evidence of polyps or  other causes for bleeding from the gastrointestinal tract.  4 units of  packed red blood cells were prescribed to the patient at the time.  Her most  recent hemoglobin as of October 28, 2005 is 9.1 and hematocrit 29.4.  The  patient states she has discomfort with intercourse as well as with her  periods as well.  She has tried norethindrone acetate in the past, but has  not been able to control periods with same.  Transvaginal ultrasound  revealed evidence of leiomyoma measuring 1-3 cm in total size with a total  uterine dimension of 10 cm.  Both adnexa appeared to be normal.  Endometrial  biopsy revealed no neoplasia or hyperplasia with proliferative endometrium.   The patient loses urine with coughing, straining, and other Valsalva  maneuvers.  She denies symptoms related to an overactive bladder.  The  patient does not lose urine at rest and does not have to strain to void.  The patient denies nocturia.  She has not had previous urological surgery or  vaginal surgery.  Patient takes no medications to enhance her propensity for  urine loss.  She denies fecal incontinence.   OB/GYN HISTORY:  Patient has had two vaginal deliveries, the first in 1993  full-term, the second 36 weeks and in 1998 had a cesarean section with a  P[place]oy bilateral tubal ligation.   MEDICAL HISTORY:  The patient has had two deep venous thromboses with  appropriate treatment, the first in 1996 during her second pregnancy in  second trimester which was treated with heparin followed by Coumadin  postpartum.  The  second deep venous thromboses episode was in 1998  postpartum and was treated with heparin followed by Coumadin.  There was a  question as to whether the patient had a thorough thrombophilia screening at  the time to account for her thromboses.  In addition, the patient has a  history of migraine headaches as well.  In the past she was treated for a  nodular goiter.  At this time the patient takes no Synthroid.   SURGICAL HISTORY:  The patient has had a Greenfield filter placed following  her second DVT.  The patient states this is in the left common iliac vein.  Patient has had laparoscopic gastric bypass in 2004.  Also, she has had an  open cholecystectomy, repair of hammer toes.  She has also had repair of a  deviated septum.   SOCIAL HISTORY:  Patient denies use of tobacco products, alcohol, or drugs.   CURRENT MEDICATIONS:   Vitamins.   ALLERGIES:  LATEX and TETRACYCLINE.   FAMILY HISTORY:  No first degree relatives with breast, colon, ovarian, or  uterine carcinoma.  Her father has had hypertension and a myocardial  infarction.   REVIEW OF SYSTEMS:  Noncontributory.   PHYSICAL EXAMINATION:  VITAL SIGNS:  Weight 164 pounds, blood pressure  100/60, height 5 feet 3 inches.  HEENT:  Grossly normal.  BREASTS:  Without mass or discharge, thickenings, or tenderness.  CHEST:  Clear to percussion, auscultation.  CARDIOVASCULAR:  Without murmurs or enlargements.  Regular rate and rhythm.  EXTREMITIES:  Within normal limits.  LYMPHATICS:  Within normal limits.  SKIN:  Within normal limits.  NEUROLOGIC:  Within normal limits.  MUSCULOSKELETAL:  Within normal limits.  ABDOMEN:  Soft without gross hepatosplenomegaly.  PELVIC:  Normal Pap smear.  External genitalia, vulva, vagina normal.  Cervix smooth without erosions or lesions.  Uterus is approximately 10 weeks  in size.  Both adnexa palpable, found to be normal.  RECTAL:  Hemoccult-negative without masses.   In-office filling cystometry reveals a residual urine volume of 14 mL.  Urinalysis is negative.  Patient demonstrates loss of urine with Valsalva  maneuvers.  There is no evidence of prolapse or a cystocele/rectocele.   IMPRESSION:  1.  Menometrorrhagia.  2.  Genuine urinary stress incontinence.   PLAN:  Patient is not a candidate for oral contraceptive, has no desire for  further childbearing, and declined the use of an intrauterine device.  She  states that she wishes to have definitive surgery and will undergo a  laparoscopic assisted vaginal hysterectomy and bilateral salpingo-  oophorectomy.  Ashby Dawes of said procedure discussed in detail.  Risks  including injury to ureter, bowel, and bladder, possible conversion to an  open procedure, hemorrhage possibly requiring blood transfusion, infection, and postoperative pulmonary complications and the  possibility of a deep  venous thrombosis or pulmonary embolism in the face of utilizing  anticoagulation were understood and discussed with said patient.   Ashby Dawes of a tension-free vaginal tape procedure and cystoscopy were  discussed.  Risks including possible postoperative voiding difficulties  including urgency, incomplete stream, or recurrent urinary stress  incontinence were discussed.  Also, postoperative infection, bleeding, graft  rejection, erosion were discussed with the patient.  At  this time the patient will not be placed on estrogen for hormone replacement  therapy.  She understands that this may result in hot flashes, irritability,  as well as other menopausal symptoms.  Ashby Dawes of said procedure was  discussed on October 28, 2005 in the  presence of her husband and all  questions answered to the satisfaction of both parties.      Ginger Carne, MD  Electronically Signed     SHB/MEDQ  D:  10/29/2005  T:  10/29/2005  Job:  [id number]

## 2011-07-02 ENCOUNTER — Other Ambulatory Visit: Payer: Self-pay | Admitting: *Deleted

## 2011-07-02 ENCOUNTER — Ambulatory Visit (INDEPENDENT_AMBULATORY_CARE_PROVIDER_SITE_OTHER): Payer: Medicare Other | Admitting: Internal Medicine

## 2011-07-02 ENCOUNTER — Encounter: Payer: Self-pay | Admitting: Internal Medicine

## 2011-07-02 VITALS — BP 108/70 | HR 55 | Temp 98.3°F | Ht 63.0 in | Wt 198.0 lb

## 2011-07-02 DIAGNOSIS — G3184 Mild cognitive impairment, so stated: Secondary | ICD-10-CM

## 2011-07-02 DIAGNOSIS — Z1231 Encounter for screening mammogram for malignant neoplasm of breast: Secondary | ICD-10-CM

## 2011-07-02 DIAGNOSIS — E538 Deficiency of other specified B group vitamins: Secondary | ICD-10-CM

## 2011-07-02 DIAGNOSIS — G479 Sleep disorder, unspecified: Secondary | ICD-10-CM

## 2011-07-02 DIAGNOSIS — F3289 Other specified depressive episodes: Secondary | ICD-10-CM

## 2011-07-02 DIAGNOSIS — F329 Major depressive disorder, single episode, unspecified: Secondary | ICD-10-CM

## 2011-07-02 DIAGNOSIS — Z0000 Encounter for general adult medical examination without abnormal findings: Secondary | ICD-10-CM

## 2011-07-02 DIAGNOSIS — E039 Hypothyroidism, unspecified: Secondary | ICD-10-CM

## 2011-07-02 MED ORDER — TEMAZEPAM 15 MG PO CAPS: 15.0000 mg | ORAL_CAPSULE | Freq: Every evening | ORAL | Status: DC | PRN

## 2011-07-02 MED ORDER — CYANOCOBALAMIN 1000 MCG/ML IJ SOLN
1000.0000 ug | Freq: Once | INTRAMUSCULAR | Status: AC
  Administered 2011-07-02: 1000 ug via INTRAMUSCULAR

## 2011-07-02 NOTE — Assessment & Plan Note (Signed)
May be related to her repair of Chiari malformation Will monitor over time

## 2011-07-02 NOTE — Telephone Encounter (Signed)
rx called into pharmacy

## 2011-07-02 NOTE — Assessment & Plan Note (Signed)
I have personally reviewed the Medicare Annual Wellness questionnaire and have noted 1. The patient's medical and social history 2. Their use of alcohol, tobacco or illicit drugs 3. Their current medications and supplements 4. The patient's functional ability including ADL's, fall risks, home safety risks and hearing or visual             impairment. 5. Diet and physical activities 6. Evidence for depression or mood disorders  The patients weight, height, BMI and visual acuity have been recorded in the chart I have made referrals, counseling and provided education to the patient based review of the above and I have provided the pt with a written personalized care plan for preventive services.  I have provided you with a copy of your personalized plan for preventive services. Please take the time to review along with your updated medication list.  No sig concerns except mild cognitive problems Will set up mammo

## 2011-07-02 NOTE — Assessment & Plan Note (Signed)
Due for labs

## 2011-07-02 NOTE — Assessment & Plan Note (Signed)
Multiple chronic stressors but seems to have more resilience than in the past Parents, esp father, have been an issue (she lives with them and dad having a hard time in general)

## 2011-07-02 NOTE — Assessment & Plan Note (Signed)
I will increase the dose of the temazepam as this may help more and it could affect her cognition and mood

## 2011-07-02 NOTE — Patient Instructions (Signed)
Please set up mammogram

## 2011-07-02 NOTE — Progress Notes (Signed)
Subjective:    Patient ID: [name], female    DOB: 15-Feb-1961, 50 y.o.   MRN: [id number]  HPI Here for wellness exam Reviewed cancer screening--due for mammo. Has had colon already imms up to date  Form reviewed--see scanned document  Still not sleeping well Feels she needs 2 of the temazepam at times  Continues on her thyroid meds Weight fairly stable Concerned about her iron Didn't have second IV iron from Dr Welton Flakes  Still on B12 shots due to past gastric bypass  Having stress with dad Mood has been okay in general Applying for new job in office at [employer]  Current Outpatient Prescriptions on File Prior to Visit  Medication Sig Dispense Refill  . cyclobenzaprine (FLEXERIL) 10 MG tablet Take 10 mg by mouth 2 (two) times daily as needed.        Marland Kitchen EPINEPHrine (EPIPEN JR) 0.15 MG/0.3ML injection Inject 0.15 mg into the muscle as needed.        Marland Kitchen HYDROcodone-acetaminophen (VICODIN) 5-500 MG per tablet Take 1-2 tablets by mouth. every 4-6 hours as needed for severe pain       . ketoconazole (NIZORAL) 2 % cream Apply 1 application topically 3 (three) times daily as needed.        . lansoprazole (PREVACID) 30 MG capsule Take 30 mg by mouth 3 (three) times daily.        Marland Kitchen levothyroxine (SYNTHROID, LEVOTHROID) 125 MCG tablet Take 125 mcg by mouth daily.        . Multiple Vitamins-Iron (DAILY MULTIVITAMINS/IRON PO) Take by mouth daily.        . polyethylene glycol (MIRALAX / GLYCOLAX) packet Take 17 g by mouth as needed.        . sertraline (ZOLOFT) 100 MG tablet Take 100 mg by mouth daily.        . vitamin B-12 (CYANOCOBALAMIN) 1000 MCG tablet Take 1,000 mcg by mouth every 30 (thirty) days.         No current facility-administered medications on file prior to visit.    Allergies  Allergen Reactions  . Tetracyclines & Related Anaphylaxis    syncope  . Latex     REACTION: "anaphylactic shock"  . Ramelteon     REACTION: blacks out  . Zolpidem Tartrate    REACTION: black outs    Past Medical History  Diagnosis Date  . Anemia   . GERD (gastroesophageal reflux disease)   . Thyroid disease   . Allergy   . Anxiety   . Depression   . History of DVT (deep vein thrombosis)   . Chronic venous insufficiency   . Greenfield filter in place     Past Surgical History  Procedure Date  . Cesarean section 1998  . Cholecystectomy 1982  . Abdominal hysterectomy   . Tubal ligation 1998  . Tonsillectomy 1965  . Vein ligation 1995/1998    right leg  . Dilation and curettage of uterus 2003  . Biopsy thyroid 01/03    suggestive of Hashimoto's  . Gastric bypass   . Thyroidectomy 2008    left  . Fetal surgery for congenital hernia   . Craniectomy suboccipital w/ cervical laminectomy / chiari 05/2010    repair, Dr.Pool    Family History  Problem Relation Age of Onset  . Arthritis Mother   . Coronary artery disease Father   . Coronary artery disease Sister   . Cancer Maternal Grandmother     Ovarian    History  Social History  . Marital Status: Divorced    Spouse Name: N/A    Number of Children: 3  . Years of Education: N/A   Occupational History  . 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 Smoking status: Never Smoker   . Smokeless tobacco: Never Used  . Alcohol Use: Yes     rare  . Drug Use: No  . Sexually Active: Not on file   Other Topics Concern  . Not on file   Social History Narrative   Still fighting over child supportSSI disabilitiy due to multiple DVT'sPart time job in middle school cafeteriaGoing back to school at [employer]   Review of Systems Sleep is variable--not enough help with the temazepam Appetite is okay Couldn't continue with [employer] this semester---problems with the bill     Objective:   Physical Exam  Constitutional: She is oriented to person, place, and time. She appears well-developed and well-nourished. No distress.  Neck: Normal range of motion.  Neck supple. No thyromegaly present.  Cardiovascular: Normal rate, regular rhythm, normal heart sounds and intact distal pulses.  Exam reveals no gallop.   No murmur heard. Pulmonary/Chest: Effort normal and breath sounds normal. No respiratory distress. She has no wheezes. She has no rales.  Abdominal: Soft. There is no tenderness.  Musculoskeletal: Normal range of motion. She exhibits no edema and no tenderness.  Lymphadenopathy:    She has no cervical adenopathy.  Neurological: She is alert and oriented to person, place, and time. She exhibits normal muscle tone.       Normal strength and gait President--"Obama, ...?" [id number] Recall 3/3  Psychiatric: She has a normal mood and affect. Her behavior is normal. Judgment and thought content normal.          Assessment & Plan:

## 2011-07-03 LAB — CBC WITH DIFFERENTIAL/PLATELET
Basophils Relative: 0.7 % (ref 0.0–3.0)
Eosinophils Relative: 2.4 % (ref 0.0–5.0)
HCT: 37.8 % (ref 36.0–46.0)
Hemoglobin: 12.9 g/dL (ref 12.0–15.0)
Lymphs Abs: 2.3 10*3/uL (ref 0.7–4.0)
MCV: 99.1 fl (ref 78.0–100.0)
Monocytes Absolute: 0.3 10*3/uL (ref 0.1–1.0)
Monocytes Relative: 6.3 % (ref 3.0–12.0)
RBC: 3.81 Mil/uL — ABNORMAL LOW (ref 3.87–5.11)
WBC: 5.2 10*3/uL (ref 4.5–10.5)

## 2011-07-03 LAB — HEPATIC FUNCTION PANEL
AST: 28 U/L (ref 0–37)
Albumin: 4.4 g/dL (ref 3.5–5.2)
Alkaline Phosphatase: 87 U/L (ref 39–117)
Total Protein: 6.7 g/dL (ref 6.0–8.3)

## 2011-07-03 LAB — BASIC METABOLIC PANEL
BUN: 7 mg/dL (ref 6–23)
CO2: 31 mEq/L (ref 19–32)
Calcium: 8.7 mg/dL (ref 8.4–10.5)
GFR: 145.43 mL/min (ref >60.00)
Glucose, Bld: 94 mg/dL (ref 70–99)

## 2011-07-03 LAB — TSH: TSH: 3.25 u[IU]/mL (ref 0.35–5.50)

## 2011-07-29 ENCOUNTER — Other Ambulatory Visit: Payer: Self-pay | Admitting: *Deleted

## 2011-07-29 NOTE — Telephone Encounter (Signed)
Form on your desk  

## 2011-07-30 ENCOUNTER — Ambulatory Visit: Payer: Medicare Other

## 2011-07-30 MED ORDER — HYDROCODONE-ACETAMINOPHEN 5-500 MG PO TABS: 1.0000 | ORAL_TABLET | Freq: Four times a day (QID) | ORAL | Status: DC | PRN

## 2011-07-30 NOTE — Telephone Encounter (Signed)
Okay #60 x 0 

## 2011-07-30 NOTE — Telephone Encounter (Signed)
rx faxed to pharmacy manually  

## 2011-09-30 ENCOUNTER — Other Ambulatory Visit: Payer: Self-pay | Admitting: *Deleted

## 2011-09-30 NOTE — Telephone Encounter (Signed)
Form on your desk  

## 2011-10-01 MED ORDER — TEMAZEPAM 15 MG PO CAPS: 15.0000 mg | ORAL_CAPSULE | Freq: Every evening | ORAL | Status: DC | PRN

## 2011-10-01 MED ORDER — HYDROCODONE-ACETAMINOPHEN 5-500 MG PO TABS: 1.0000 | ORAL_TABLET | Freq: Four times a day (QID) | ORAL | Status: DC | PRN

## 2011-10-01 NOTE — Telephone Encounter (Signed)
Okay # 60 x 1 for temazepam #60 x 0 for hydrocodone

## 2011-10-01 NOTE — Telephone Encounter (Signed)
rx faxed to pharmacy manually  

## 2011-10-02 LAB — CBC
HCT: 30.7 — ABNORMAL LOW
HCT: 31.5 — ABNORMAL LOW
Hemoglobin: 9.6 — ABNORMAL LOW
Hemoglobin: 9.8 — ABNORMAL LOW
MCHC: 31.2
MCHC: 31.4
MCV: 72.3 — ABNORMAL LOW
MCV: 73.3 — ABNORMAL LOW
RBC: 4.25
RBC: 4.29
WBC: 5.1
WBC: 5.5

## 2011-10-02 LAB — BASIC METABOLIC PANEL
CO2: 29
Chloride: 108
Creatinine, Ser: 0.56
GFR calc Af Amer: >60
Potassium: 3.4 — ABNORMAL LOW

## 2011-10-02 LAB — CARDIAC PANEL(CRET KIN+CKTOT+MB+TROPI)
CK, MB: 0.7
CK, MB: 0.7
CK, MB: 0.8
Relative Index: INVALID
Total CK: 71
Troponin I: 0.02
Troponin I: 0.02

## 2011-10-02 LAB — PROTIME-INR: Prothrombin Time: 13.8

## 2011-10-26 ENCOUNTER — Telehealth: Payer: Self-pay | Admitting: *Deleted

## 2011-10-26 ENCOUNTER — Other Ambulatory Visit: Payer: Self-pay | Admitting: Oncology

## 2011-10-26 DIAGNOSIS — D508 Other iron deficiency anemias: Secondary | ICD-10-CM

## 2011-10-26 NOTE — Telephone Encounter (Signed)
Orders for cbc and iron studies placed in computer and sent to schedulers

## 2011-10-26 NOTE — Telephone Encounter (Signed)
Gave patient appointment for 10-2011.

## 2011-10-26 NOTE — Telephone Encounter (Signed)
Have patient come in and get labs first

## 2011-10-26 NOTE — Telephone Encounter (Signed)
Pt called b/c she is very fatigued and is "thinking i may need iron infusion" Pt wants to see either Thayer Ohm or MD. Pt cb # [phone]

## 2011-10-26 NOTE — Telephone Encounter (Signed)
Here you go doc.Marland Kitchen

## 2011-10-30 ENCOUNTER — Other Ambulatory Visit: Payer: Self-pay | Admitting: Oncology

## 2011-10-30 ENCOUNTER — Telehealth: Payer: Self-pay | Admitting: *Deleted

## 2011-10-30 ENCOUNTER — Other Ambulatory Visit (HOSPITAL_BASED_OUTPATIENT_CLINIC_OR_DEPARTMENT_OTHER): Payer: Medicare Other

## 2011-10-30 DIAGNOSIS — D508 Other iron deficiency anemias: Secondary | ICD-10-CM

## 2011-10-30 LAB — CBC WITH DIFFERENTIAL/PLATELET
BASO%: 0.2 % (ref 0.0–2.0)
Basophils Absolute: 0 10*3/uL (ref 0.0–0.1)
EOS%: 2.9 % (ref 0.0–7.0)
HCT: 38.9 % (ref 34.8–46.6)
HGB: 12.9 g/dL (ref 11.6–15.9)
LYMPH%: 46.1 % (ref 14.0–49.7)
MCH: 31.5 pg (ref 25.1–34.0)
MCHC: 33.2 g/dL (ref 31.5–36.0)
NEUT%: 43.9 % (ref 38.4–76.8)
Platelets: 176 10*3/uL (ref 145–400)
lymph#: 2.1 10*3/uL (ref 0.9–3.3)

## 2011-10-30 NOTE — Telephone Encounter (Signed)
Per MD, notified pt that her CBC looks great

## 2011-10-30 NOTE — Telephone Encounter (Signed)
Per MD notified pt CBC is okay

## 2011-11-02 ENCOUNTER — Telehealth: Payer: Self-pay | Admitting: *Deleted

## 2011-11-02 ENCOUNTER — Ambulatory Visit: Payer: Medicare Other | Admitting: Oncology

## 2011-11-02 NOTE — Telephone Encounter (Signed)
PATIENT CONFIRMED NEW DATE AND TIME IN 12-2011

## 2011-11-03 ENCOUNTER — Telehealth: Payer: Self-pay | Admitting: Oncology

## 2011-11-03 NOTE — Telephone Encounter (Signed)
called pt lmovm for appt on 11/27 and to rtn call to confirm appt

## 2011-11-17 ENCOUNTER — Ambulatory Visit: Payer: Medicare Other | Admitting: Oncology

## 2011-11-17 ENCOUNTER — Other Ambulatory Visit: Payer: Medicare Other | Admitting: Lab

## 2011-11-30 ENCOUNTER — Other Ambulatory Visit: Payer: Self-pay | Admitting: *Deleted

## 2011-11-30 MED ORDER — TEMAZEPAM 15 MG PO CAPS: 15.0000 mg | ORAL_CAPSULE | Freq: Every evening | ORAL | Status: DC | PRN

## 2011-11-30 MED ORDER — HYDROCODONE-ACETAMINOPHEN 5-500 MG PO TABS: 1.0000 | ORAL_TABLET | Freq: Four times a day (QID) | ORAL | Status: DC | PRN

## 2011-11-30 NOTE — Telephone Encounter (Signed)
Ok to fill? Last refilled 10/27/11

## 2011-11-30 NOTE — Telephone Encounter (Signed)
Okay #60 x 1 of temazepam #60 x 0 of the hydrocodone

## 2011-11-30 NOTE — Telephone Encounter (Signed)
rx called into pharmacy

## 2011-11-30 NOTE — Telephone Encounter (Signed)
Received faxed refill request from pharmacy. Directions read to take 1-2 by mouth at bedtime for muscle pain as needed.

## 2011-12-01 MED ORDER — CYCLOBENZAPRINE HCL 10 MG PO TABS: 10.0000 mg | ORAL_TABLET | Freq: Two times a day (BID) | ORAL | Status: DC | PRN

## 2011-12-01 NOTE — Telephone Encounter (Signed)
Should be 1 bid prn #60 x 0

## 2011-12-01 NOTE — Telephone Encounter (Signed)
rx sent to pharmacy by e-script  

## 2011-12-24 ENCOUNTER — Other Ambulatory Visit: Payer: Self-pay | Admitting: Physician Assistant

## 2011-12-28 ENCOUNTER — Other Ambulatory Visit: Payer: Self-pay | Admitting: Oncology

## 2011-12-28 ENCOUNTER — Ambulatory Visit: Payer: Medicare Other | Admitting: Physician Assistant

## 2011-12-28 ENCOUNTER — Other Ambulatory Visit (HOSPITAL_BASED_OUTPATIENT_CLINIC_OR_DEPARTMENT_OTHER): Payer: Medicare Other | Admitting: Lab

## 2011-12-28 DIAGNOSIS — D6869 Other thrombophilia: Secondary | ICD-10-CM | POA: Diagnosis not present

## 2011-12-28 DIAGNOSIS — D509 Iron deficiency anemia, unspecified: Secondary | ICD-10-CM | POA: Diagnosis not present

## 2011-12-28 DIAGNOSIS — Z86718 Personal history of other venous thrombosis and embolism: Secondary | ICD-10-CM

## 2011-12-28 DIAGNOSIS — D6859 Other primary thrombophilia: Secondary | ICD-10-CM | POA: Diagnosis not present

## 2011-12-28 LAB — CBC WITH DIFFERENTIAL/PLATELET
Basophils Absolute: 0 10*3/uL (ref 0.0–0.1)
EOS%: 3 % (ref 0.0–7.0)
Eosinophils Absolute: 0.1 10*3/uL (ref 0.0–0.5)
HGB: 12.5 g/dL (ref 11.6–15.9)
LYMPH%: 46.9 % (ref 14.0–49.7)
MCH: 32.2 pg (ref 25.1–34.0)
MCV: 94.8 fL (ref 79.5–101.0)
MONO%: 8.3 % (ref 0.0–14.0)
NEUT#: 1.5 10*3/uL (ref 1.5–6.5)
Platelets: 149 10*3/uL (ref 145–400)
RBC: 3.89 10*6/uL (ref 3.70–5.45)

## 2011-12-28 LAB — IRON AND TIBC: %SAT: 19 % — ABNORMAL LOW (ref 20–55)

## 2011-12-29 ENCOUNTER — Other Ambulatory Visit: Payer: Self-pay | Admitting: Internal Medicine

## 2011-12-29 ENCOUNTER — Telehealth: Payer: Self-pay | Admitting: Oncology

## 2011-12-29 NOTE — Telephone Encounter (Signed)
called pt  lmovm with appt on 01/05/2012 and to rtn call to confirm

## 2011-12-30 ENCOUNTER — Other Ambulatory Visit: Payer: Self-pay | Admitting: *Deleted

## 2011-12-30 MED ORDER — LEVOTHYROXINE SODIUM 125 MCG PO TABS: 125.0000 ug | ORAL_TABLET | Freq: Every day | ORAL | Status: DC

## 2011-12-30 NOTE — Telephone Encounter (Signed)
Okay #60 x 1 

## 2011-12-30 NOTE — Telephone Encounter (Signed)
rx sent to pharmacy by e-script  

## 2012-01-04 ENCOUNTER — Other Ambulatory Visit: Payer: Self-pay | Admitting: Oncology

## 2012-01-04 ENCOUNTER — Other Ambulatory Visit: Payer: Self-pay | Admitting: *Deleted

## 2012-01-04 DIAGNOSIS — E039 Hypothyroidism, unspecified: Secondary | ICD-10-CM

## 2012-01-04 DIAGNOSIS — E538 Deficiency of other specified B group vitamins: Secondary | ICD-10-CM

## 2012-01-04 DIAGNOSIS — D126 Benign neoplasm of colon, unspecified: Secondary | ICD-10-CM

## 2012-01-05 ENCOUNTER — Ambulatory Visit: Payer: Medicare Other | Admitting: Internal Medicine

## 2012-01-05 ENCOUNTER — Ambulatory Visit: Payer: Medicare Other | Admitting: Family

## 2012-01-05 ENCOUNTER — Other Ambulatory Visit: Payer: Medicare Other | Admitting: Lab

## 2012-01-05 ENCOUNTER — Telehealth: Payer: Self-pay | Admitting: Oncology

## 2012-01-05 DIAGNOSIS — Z0289 Encounter for other administrative examinations: Secondary | ICD-10-CM

## 2012-01-05 NOTE — Telephone Encounter (Signed)
lmonvm adviisng the pt of her iron infusion appts on Friday 01/08/2012 and 01/15/2012

## 2012-01-08 ENCOUNTER — Ambulatory Visit (HOSPITAL_BASED_OUTPATIENT_CLINIC_OR_DEPARTMENT_OTHER): Payer: Medicare Other

## 2012-01-08 VITALS — BP 111/73 | HR 63 | Temp 98.2°F

## 2012-01-08 DIAGNOSIS — D649 Anemia, unspecified: Secondary | ICD-10-CM

## 2012-01-08 MED ORDER — SODIUM CHLORIDE 0.9 % IJ SOLN
3.0000 mL | Freq: Once | INTRAMUSCULAR | Status: DC | PRN
  Filled 2012-01-08: qty 10

## 2012-01-08 MED ORDER — PROCHLORPERAZINE MALEATE 10 MG PO TABS: 10.0000 mg | ORAL_TABLET | Freq: Once | ORAL | Status: DC

## 2012-01-08 MED ORDER — DIPHENHYDRAMINE HCL 25 MG PO CAPS: 25.0000 mg | ORAL_CAPSULE | Freq: Once | ORAL | Status: DC

## 2012-01-08 MED ORDER — DIPHENHYDRAMINE HCL 25 MG PO CAPS
25.0000 mg | ORAL_CAPSULE | Freq: Once | ORAL | Status: AC
  Administered 2012-01-08: 25 mg via ORAL

## 2012-01-08 MED ORDER — FERUMOXYTOL INJECTION 510 MG/17 ML
510.0000 mg | Freq: Once | INTRAVENOUS | Status: AC
  Administered 2012-01-08: 510 mg via INTRAVENOUS
  Filled 2012-01-08: qty 17

## 2012-01-08 MED ORDER — PROCHLORPERAZINE MALEATE 10 MG PO TABS
10.0000 mg | ORAL_TABLET | Freq: Once | ORAL | Status: AC
  Administered 2012-01-08: 10 mg via ORAL

## 2012-01-08 MED ORDER — SODIUM CHLORIDE 0.9 % IV SOLN: Freq: Once | INTRAVENOUS | Status: DC

## 2012-01-08 NOTE — Patient Instructions (Signed)
Patient ambulatory out of clinic without complaints.  Instructed patient to call with any issues.  Patient aware of next appointment 

## 2012-01-13 ENCOUNTER — Telehealth: Payer: Self-pay | Admitting: *Deleted

## 2012-01-13 NOTE — Telephone Encounter (Signed)
Okay to proceed with injection No labs needed ---she has had longstanding B12 deficiency since her bariatric surgery

## 2012-01-13 NOTE — Telephone Encounter (Signed)
Patient walked in today asking for b-12 injection and the last B-12 we see was on 07/02/11 at a OV, no labs done for b-12, please advise on injection.

## 2012-01-13 NOTE — Telephone Encounter (Signed)
Just to be clear for all clinical staff if they have to give this patient a B-12, should she be getting these every month? We check on each pt's chart for the last B-12.

## 2012-01-13 NOTE — Telephone Encounter (Signed)
Pt scheduled appt for 01/14/12 @ 2:30

## 2012-01-13 NOTE — Telephone Encounter (Signed)
She is supposed to be getting them monthly Some inconsistency is okay but this gap is more than generally tolerable without the possibility of symptoms from low levels again She should try to get them at least every 6 weeks

## 2012-01-14 ENCOUNTER — Ambulatory Visit (INDEPENDENT_AMBULATORY_CARE_PROVIDER_SITE_OTHER): Payer: Medicare Other | Admitting: *Deleted

## 2012-01-14 DIAGNOSIS — E538 Deficiency of other specified B group vitamins: Secondary | ICD-10-CM

## 2012-01-14 MED ORDER — CYANOCOBALAMIN 1000 MCG/ML IJ SOLN
1000.0000 ug | Freq: Once | INTRAMUSCULAR | Status: AC
  Administered 2012-01-14: 1000 ug via INTRAMUSCULAR

## 2012-01-15 ENCOUNTER — Ambulatory Visit (HOSPITAL_BASED_OUTPATIENT_CLINIC_OR_DEPARTMENT_OTHER): Payer: Medicare Other

## 2012-01-15 DIAGNOSIS — D649 Anemia, unspecified: Secondary | ICD-10-CM

## 2012-01-15 MED ORDER — SODIUM CHLORIDE 0.9 % IV SOLN: Freq: Once | INTRAVENOUS | Status: DC

## 2012-01-15 MED ORDER — ACETAMINOPHEN 325 MG PO TABS
650.0000 mg | ORAL_TABLET | Freq: Once | ORAL | Status: AC
  Administered 2012-01-15: 650 mg via ORAL

## 2012-01-15 MED ORDER — DIPHENHYDRAMINE HCL 25 MG PO CAPS
50.0000 mg | ORAL_CAPSULE | ORAL | Status: AC
  Administered 2012-01-15: 50 mg via ORAL

## 2012-01-15 MED ORDER — SODIUM CHLORIDE 0.9 % IJ SOLN
3.0000 mL | Freq: Once | INTRAMUSCULAR | Status: DC | PRN
  Filled 2012-01-15: qty 10

## 2012-01-15 MED ORDER — FERUMOXYTOL INJECTION 510 MG/17 ML
510.0000 mg | Freq: Once | INTRAVENOUS | Status: AC
  Administered 2012-01-15: 510 mg via INTRAVENOUS
  Filled 2012-01-15: qty 17

## 2012-01-15 NOTE — Patient Instructions (Signed)
Patient aware of next appointment; no c/p pain at discharge; discharged home with family.

## 2012-02-12 ENCOUNTER — Other Ambulatory Visit: Payer: Self-pay

## 2012-02-12 MED ORDER — TEMAZEPAM 15 MG PO CAPS: 15.0000 mg | ORAL_CAPSULE | Freq: Every evening | ORAL | Status: DC | PRN

## 2012-02-12 MED ORDER — HYDROCODONE-ACETAMINOPHEN 5-500 MG PO TABS: 1.0000 | ORAL_TABLET | Freq: Four times a day (QID) | ORAL | Status: DC | PRN

## 2012-02-12 NOTE — Telephone Encounter (Signed)
Okay temazepam #60 x 0 Hydrocodone #60 x 0  Have her reschedule her follow up that was due recently

## 2012-02-12 NOTE — Telephone Encounter (Signed)
rx called into pharmacy Left message with adult at home to have pt to call to reschedule appointment

## 2012-02-12 NOTE — Telephone Encounter (Signed)
Received fax refill request from patients pharmacy.  Okay to refill? 

## 2012-03-16 ENCOUNTER — Other Ambulatory Visit: Payer: Self-pay | Admitting: Internal Medicine

## 2012-03-17 MED ORDER — HYDROCODONE-ACETAMINOPHEN 5-500 MG PO TABS: 1.0000 | ORAL_TABLET | Freq: Four times a day (QID) | ORAL | Status: DC | PRN

## 2012-03-17 MED ORDER — TEMAZEPAM 15 MG PO CAPS: 15.0000 mg | ORAL_CAPSULE | Freq: Every evening | ORAL | Status: DC | PRN

## 2012-03-17 NOTE — Telephone Encounter (Signed)
Okay #60 x 0 for each of the three Rx Have her reschedule her appt----she actually 3 on the same day and no showed all of them

## 2012-03-17 NOTE — Telephone Encounter (Signed)
Pt no-showed last appt 01/05/12, ok to refill?

## 2012-03-17 NOTE — Telephone Encounter (Signed)
rx sent to pharmacy by e-script rx called into pharmacy Patient scheduled appt

## 2012-03-29 ENCOUNTER — Ambulatory Visit: Payer: Medicare Other | Admitting: Internal Medicine

## 2012-03-31 ENCOUNTER — Telehealth: Payer: Self-pay | Admitting: *Deleted

## 2012-03-31 NOTE — Telephone Encounter (Signed)
Form on your desk asking for prior auth for Temazepam.

## 2012-03-31 NOTE — Telephone Encounter (Signed)
Form done They didn't indicate formulary alternatives so I am not sure what they are asking

## 2012-04-01 NOTE — Telephone Encounter (Signed)
Form faxed back.

## 2012-04-15 ENCOUNTER — Ambulatory Visit: Payer: Medicare Other | Admitting: Internal Medicine

## 2012-04-29 ENCOUNTER — Other Ambulatory Visit: Payer: Self-pay | Admitting: *Deleted

## 2012-04-29 MED ORDER — HYDROCODONE-ACETAMINOPHEN 5-500 MG PO TABS: 1.0000 | ORAL_TABLET | Freq: Four times a day (QID) | ORAL | Status: DC | PRN

## 2012-04-29 MED ORDER — TEMAZEPAM 15 MG PO CAPS: 15.0000 mg | ORAL_CAPSULE | Freq: Every evening | ORAL | Status: DC | PRN

## 2012-04-29 NOTE — Telephone Encounter (Signed)
Okay #60 x 0 for each 

## 2012-04-29 NOTE — Telephone Encounter (Signed)
rx called into pharmacy

## 2012-05-02 ENCOUNTER — Ambulatory Visit (INDEPENDENT_AMBULATORY_CARE_PROVIDER_SITE_OTHER): Payer: Medicare Other | Admitting: Internal Medicine

## 2012-05-02 ENCOUNTER — Encounter: Payer: Self-pay | Admitting: Internal Medicine

## 2012-05-02 VITALS — BP 110/60 | HR 63 | Temp 97.3°F | Ht 63.0 in | Wt 227.0 lb

## 2012-05-02 DIAGNOSIS — J019 Acute sinusitis, unspecified: Secondary | ICD-10-CM

## 2012-05-02 DIAGNOSIS — F3289 Other specified depressive episodes: Secondary | ICD-10-CM | POA: Diagnosis not present

## 2012-05-02 DIAGNOSIS — I872 Venous insufficiency (chronic) (peripheral): Secondary | ICD-10-CM

## 2012-05-02 DIAGNOSIS — F329 Major depressive disorder, single episode, unspecified: Secondary | ICD-10-CM | POA: Diagnosis not present

## 2012-05-02 DIAGNOSIS — E039 Hypothyroidism, unspecified: Secondary | ICD-10-CM

## 2012-05-02 DIAGNOSIS — E538 Deficiency of other specified B group vitamins: Secondary | ICD-10-CM

## 2012-05-02 LAB — TSH: TSH: 12.08 u[IU]/mL — ABNORMAL HIGH (ref 0.35–5.50)

## 2012-05-02 MED ORDER — CYANOCOBALAMIN 1000 MCG/ML IJ SOLN
1000.0000 ug | Freq: Once | INTRAMUSCULAR | Status: AC
  Administered 2012-05-02: 1000 ug via INTRAMUSCULAR

## 2012-05-02 NOTE — Assessment & Plan Note (Signed)
Can get some severe pain from the swelling At most, 1 hydrocodone at night after long day on feet at school

## 2012-05-02 NOTE — Assessment & Plan Note (Signed)
2 weeks of purulent mucus Does have some frontal tenderness and mild inflammation Will treat with amoxil

## 2012-05-02 NOTE — Assessment & Plan Note (Signed)
Due for labs

## 2012-05-02 NOTE — Assessment & Plan Note (Signed)
Ongoing stress but no persistent depressed mood Stable on sertraline Uses the temazepam fo rsleep

## 2012-05-02 NOTE — Progress Notes (Signed)
Subjective:    Patient ID: [name], female    DOB: [birth year], 51 y.o.   MRN: [id number]  HPI Doing okay Got new (used) minivan but now it has mechanical problems. 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 cyanocobalamin (,VITAMIN B-12,) 1000 MCG/ML injection Inject 1,000 mcg into the muscle every 30 (thirty) days.      . cyclobenzaprine (FLEXERIL) 10 MG tablet TAKE 1 TABLET (10 MG TOTAL) BY MOUTH 2 (TWO) TIMES DAILY AS NEEDED FOR MUSCLE SPASMS.  60 tablet  0  . EPINEPHrine (EPIPEN JR) 0.15 MG/0.3ML injection Inject 0.15 mg into the muscle as needed.        Marland Kitchen HYDROcodone-acetaminophen (VICODIN) 5-500 MG per tablet Take 1-2 tablets by mouth every 6 (six) hours as needed for pain.  60 tablet  0  . ketoconazole (NIZORAL) 2 % cream Apply 1 application topically 3 (three) times daily as needed.        . lansoprazole (PREVACID) 30 MG capsule Take 30 mg by mouth 3 (three) times daily.        Marland Kitchen levothyroxine (SYNTHROID, LEVOTHROID) 125 MCG tablet Take 1 tablet (125 mcg total) by mouth daily.  30 tablet  6  . Multiple Vitamins-Iron (DAILY MULTIVITAMINS/IRON PO) Take by mouth daily.        . polyethylene glycol (MIRALAX / GLYCOLAX) packet Take 17 g by mouth as needed.        . temazepam (RESTORIL) 15 MG capsule Take 1-2 capsules (15-30 mg total) by mouth at bedtime as needed.  60 capsule  0    Allergies  Allergen  Reactions  . Tetracyclines & Related Anaphylaxis    syncope  . Latex     REACTION: "anaphylactic shock"  . Ramelteon     REACTION: blacks out  . Zolpidem Tartrate     REACTION: black outs    Past Medical History  Diagnosis Date  . Anemia   . GERD (gastroesophageal reflux disease)   . Thyroid disease   . Allergy   . Anxiety   . Depression   . History of DVT (deep vein thrombosis)   . Chronic venous insufficiency   . Greenfield filter in place     Past Surgical History  Procedure Date  . Cesarean section 1998  . Cholecystectomy 1982  . Abdominal hysterectomy   . Tubal ligation 1998  . Tonsillectomy 1965  . Vein ligation 1995/1998    right leg  . Dilation and curettage of uterus 2003  . Biopsy thyroid 01/03    suggestive of Hashimoto's  . Gastric bypass   . Thyroidectomy 2008    left  . Fetal surgery for congenital hernia   . Craniectomy suboccipital w/ cervical laminectomy / chiari 05/2010    repair, Dr.Pool    Family History  Problem Relation Age of Onset  . Arthritis Mother   . Coronary artery disease Father   . Coronary  artery disease Sister   . Cancer Maternal Grandmother     Ovarian    History   Social History  . Marital Status: Divorced    Spouse Name: N/A    Number of Children: 3  . Years of Education: N/A   Occupational History  . 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 Smoking status: Never Smoker   . Smokeless tobacco: Never Used  . Alcohol Use: Yes     rare  . Drug Use: No  . Sexually Active: Not on file   Other Topics Concern  . Not on file   Social History Narrative   Still fighting over child supportSSI disabilitiy due to multiple DVT'sPart time job in middle school cafeteriaGoing back to school at [employer]   Review of Systems Weight is up over 20# Has been snacking a lot----working on this    Objective:   Physical Exam        Assessment & Plan:

## 2012-05-04 ENCOUNTER — Other Ambulatory Visit: Payer: Self-pay | Admitting: *Deleted

## 2012-05-04 ENCOUNTER — Telehealth: Payer: Self-pay

## 2012-05-04 MED ORDER — LEVOTHYROXINE SODIUM 150 MCG PO TABS: 150.0000 ug | ORAL_TABLET | Freq: Every day | ORAL | Status: DC

## 2012-05-04 NOTE — Telephone Encounter (Signed)
Pt left v/m pt  seen 05/02/12; pt thought antibiotic was being sent to [facility] but when checked with pharmacy no med sent in. On 05/02/12 visit under assessment and plan noted will treat with Amoxil.Pt request antibiotic sent to [facility] and pt can be reached at [phone].

## 2012-05-05 MED ORDER — AMOXICILLIN 500 MG PO TABS: 1000.0000 mg | ORAL_TABLET | Freq: Two times a day (BID) | ORAL | Status: AC

## 2012-05-05 NOTE — Telephone Encounter (Signed)
Spoke with patient and advised results   

## 2012-05-05 NOTE — Telephone Encounter (Signed)
Please apologize for oversight I have sent the Rx now so she can pick it up

## 2012-05-16 ENCOUNTER — Encounter (HOSPITAL_COMMUNITY): Payer: Self-pay | Admitting: *Deleted

## 2012-05-16 ENCOUNTER — Emergency Department (HOSPITAL_COMMUNITY)
Admission: EM | Admit: 2012-05-16 | Discharge: 2012-05-16 | Disposition: A | Discharge status: Home / Self Care | Payer: Medicare Other | Attending: Emergency Medicine | Admitting: Emergency Medicine

## 2012-05-16 ENCOUNTER — Encounter (HOSPITAL_COMMUNITY): Payer: Self-pay

## 2012-05-16 ENCOUNTER — Emergency Department (INDEPENDENT_AMBULATORY_CARE_PROVIDER_SITE_OTHER)
Admission: EM | Admit: 2012-05-16 | Discharge: 2012-05-16 | Disposition: A | Discharge status: Nursing Home (Includes ICF) | Payer: Medicare Other | Source: Home / Self Care | Attending: Emergency Medicine | Admitting: Emergency Medicine

## 2012-05-16 DIAGNOSIS — W57XXXA Bitten or stung by nonvenomous insect and other nonvenomous arthropods, initial encounter: Secondary | ICD-10-CM

## 2012-05-16 DIAGNOSIS — T148XXA Other injury of unspecified body region, initial encounter: Secondary | ICD-10-CM

## 2012-05-16 DIAGNOSIS — R51 Headache: Secondary | ICD-10-CM | POA: Insufficient documentation

## 2012-05-16 DIAGNOSIS — A779 Spotted fever, unspecified: Secondary | ICD-10-CM | POA: Diagnosis not present

## 2012-05-16 DIAGNOSIS — S90569A Insect bite (nonvenomous), unspecified ankle, initial encounter: Secondary | ICD-10-CM | POA: Insufficient documentation

## 2012-05-16 DIAGNOSIS — R5383 Other fatigue: Secondary | ICD-10-CM | POA: Diagnosis not present

## 2012-05-16 DIAGNOSIS — R509 Fever, unspecified: Secondary | ICD-10-CM | POA: Diagnosis not present

## 2012-05-16 DIAGNOSIS — Z86718 Personal history of other venous thrombosis and embolism: Secondary | ICD-10-CM | POA: Insufficient documentation

## 2012-05-16 DIAGNOSIS — E079 Disorder of thyroid, unspecified: Secondary | ICD-10-CM | POA: Diagnosis not present

## 2012-05-16 DIAGNOSIS — K219 Gastro-esophageal reflux disease without esophagitis: Secondary | ICD-10-CM | POA: Insufficient documentation

## 2012-05-16 DIAGNOSIS — S80269A Insect bite (nonvenomous), unspecified knee, initial encounter: Secondary | ICD-10-CM

## 2012-05-16 DIAGNOSIS — A770 Spotted fever due to Rickettsia rickettsii: Secondary | ICD-10-CM

## 2012-05-16 DIAGNOSIS — Z79899 Other long term (current) drug therapy: Secondary | ICD-10-CM | POA: Insufficient documentation

## 2012-05-16 DIAGNOSIS — R5381 Other malaise: Secondary | ICD-10-CM | POA: Diagnosis not present

## 2012-05-16 DIAGNOSIS — F341 Dysthymic disorder: Secondary | ICD-10-CM | POA: Diagnosis not present

## 2012-05-16 LAB — POCT URINALYSIS DIP (DEVICE)
Bilirubin Urine: NEGATIVE
Glucose, UA: NEGATIVE mg/dL
Ketones, ur: NEGATIVE mg/dL
Nitrite: NEGATIVE
pH: 6 (ref 5.0–8.0)

## 2012-05-16 MED ORDER — DIPHENHYDRAMINE HCL 25 MG PO CAPS
25.0000 mg | ORAL_CAPSULE | Freq: Once | ORAL | Status: AC
  Administered 2012-05-16: 25 mg via ORAL
  Filled 2012-05-16: qty 1

## 2012-05-16 MED ORDER — DOXYCYCLINE HYCLATE 100 MG PO CAPS: 100.0000 mg | ORAL_CAPSULE | Freq: Two times a day (BID) | ORAL | Status: AC

## 2012-05-16 MED ORDER — PREDNISONE 20 MG PO TABS
60.0000 mg | ORAL_TABLET | Freq: Once | ORAL | Status: AC
  Administered 2012-05-16: 60 mg via ORAL
  Filled 2012-05-16: qty 3

## 2012-05-16 MED ORDER — BACITRACIN 500 UNIT/GM EX OINT
1.0000 "application " | TOPICAL_OINTMENT | Freq: Once | CUTANEOUS | Status: AC
  Administered 2012-05-16: 1 via TOPICAL

## 2012-05-16 MED ORDER — DOXYCYCLINE HYCLATE 100 MG PO TABS
100.0000 mg | ORAL_TABLET | Freq: Once | ORAL | Status: AC
  Administered 2012-05-16: 100 mg via ORAL
  Filled 2012-05-16: qty 1

## 2012-05-16 MED ORDER — PREDNISONE 20 MG PO TABS: 40.0000 mg | ORAL_TABLET | Freq: Every day | ORAL | Status: AC

## 2012-05-16 NOTE — ED Provider Notes (Signed)
History     CSN: [id number]  Arrival date & time 05/16/12  1229   First MD Initiated Contact with Patient 05/16/12 1311      Chief Complaint  Patient presents with  . Tick Removal    (Consider location/radiation/quality/duration/timing/severity/associated sxs/prior treatment) HPI Comments: Patient presents today for tick removal. Her son removed a tick behind her left knee earlier today, but was unable to get the second one. She does not know how long they have been attached. The patient also reports removing an attached, non-engorged tick from her thigh last week, and complains of headache, bodyaches, fatigue, fever Tmax 101 about 4 days ago. No aggravating or alleviating factors. She's not tried anything for this. No photophobia, neck stiffness, rash. Has reports some urinary urgency. No frequency, dysuria, cloudy or oderous urine. No nausea, vomiting, abdominal pain. No ear pain, sinus pain, sore throat. No coughing, wheezing, shortness of breath. No abdominal pain. States her tetanus is up-to-date.  Patient is a 51 y.o. female presenting with fever. The history is provided by the patient.  Fever Primary symptoms of the febrile illness include fever, fatigue and myalgias. The current episode started 3 to 5 days ago. This is a new problem. The problem has not changed since onset.   Past Medical History  Diagnosis Date  . Anemia   . GERD (gastroesophageal reflux disease)   . Thyroid disease   . Allergy   . Anxiety   . Depression   . History of DVT (deep vein thrombosis)   . Chronic venous insufficiency   . Greenfield filter in place     Past Surgical History  Procedure Date  . Cesarean section 1998  . Cholecystectomy 1982  . Abdominal hysterectomy   . Tubal ligation 1998  . Tonsillectomy 1965  . Vein ligation 1995/1998    right leg  . Dilation and curettage of uterus 2003  . Biopsy thyroid 01/03    suggestive of Hashimoto's  . Gastric bypass   . Thyroidectomy 2008   left  . Fetal surgery for congenital hernia   . Craniectomy suboccipital w/ cervical laminectomy / chiari 05/2010    repair, Dr.Pool    Family History  Problem Relation Age of Onset  . Arthritis Mother   . Coronary artery disease Father   . Coronary artery disease Sister   . Cancer Maternal Grandmother     Ovarian    History  Substance Use Topics  . Smoking status: Never Smoker   . Smokeless tobacco: Never Used  . Alcohol Use: Yes     rare    OB History    Grav Para Term Preterm Abortions TAB SAB Ect Mult Living                  Review of Systems  Constitutional: Positive for fever and fatigue.  Musculoskeletal: Positive for myalgias.    Allergies  Tetracyclines & related; Latex; Ramelteon; and Zolpidem tartrate  Home Medications   Current Outpatient Rx  Name Route Sig Dispense Refill  . AMOXICILLIN 500 MG PO TABS Oral Take 2 tablets (1,000 mg total) by mouth 2 (two) times daily. 40 tablet 0  . CYANOCOBALAMIN 1000 MCG/ML IJ SOLN Intramuscular Inject 1,000 mcg into the muscle every 30 (thirty) days.    . CYCLOBENZAPRINE HCL 10 MG PO TABS  TAKE 1 TABLET (10 MG TOTAL) BY MOUTH 2 (TWO) TIMES DAILY AS NEEDED FOR MUSCLE SPASMS. 60 tablet 0  . EPINEPHRINE 0.15 MG/0.3ML IJ DEVI Intramuscular Inject 0.15  mg into the muscle as needed.      Marland Kitchen HYDROCODONE-ACETAMINOPHEN 5-500 MG PO TABS Oral Take 1-2 tablets by mouth every 6 (six) hours as needed for pain. 60 tablet 0  . KETOCONAZOLE 2 % EX CREA Topical Apply 1 application topically 3 (three) times daily as needed.      Marland Kitchen LANSOPRAZOLE 30 MG PO CPDR Oral Take 30 mg by mouth 3 (three) times daily.      Marland Kitchen LEVOTHYROXINE SODIUM 150 MCG PO TABS Oral Take 1 tablet (150 mcg total) by mouth daily. 90 tablet 3  . DAILY MULTIVITAMINS/IRON PO Oral Take by mouth daily.      Marland Kitchen POLYETHYLENE GLYCOL 3350 PO PACK Oral Take 17 g by mouth as needed.      Marland Kitchen TEMAZEPAM 15 MG PO CAPS Oral Take 1-2 capsules (15-30 mg total) by mouth at bedtime as  needed. 60 capsule 0    BP 112/76  Pulse 68  Temp(Src) 98.9 F (37.2 C) (Oral)  Resp 18  SpO2 99%  Physical Exam  Nursing note and vitals reviewed. Constitutional: She is oriented to person, place, and time. She appears well-developed and well-nourished. No distress.  HENT:  Head: Normocephalic and atraumatic.  Eyes: Conjunctivae and EOM are normal.  Neck: Normal range of motion.  Cardiovascular: Normal rate.   Pulmonary/Chest: Effort normal and breath sounds normal.  Abdominal: Soft. Bowel sounds are normal. She exhibits no distension and no mass. There is no tenderness. There is no rebound and no guarding.  Musculoskeletal: Normal range of motion.  Neurological: She is alert and oriented to person, place, and time.  Skin: Skin is warm and dry.       Attached, non-engorged tick posterior left knee. Mild surrounding erythema. No localized tenderness. No purulent discharge.  Psychiatric: She has a normal mood and affect. Her behavior is normal. Judgment and thought content normal.    ED Course  FOREIGN BODY REMOVAL Date/Time: 05/16/2012 2:43 PM Performed by: Luiz Blare Authorized by: Luiz Blare Consent: Verbal consent obtained. Risks and benefits: risks, benefits and alternatives were discussed Consent given by: patient Patient understanding: patient states understanding of the procedure being performed Patient consent: the patient's understanding of the procedure matches consent given Required items: required blood products, implants, devices, and special equipment available Patient identity confirmed: verbally with patient Time out: Immediately prior to procedure a "time out" was called to verify the correct patient, procedure, equipment, support staff and site/side marked as required. Intake: Posterior left knee. Patient sedated: no Patient restrained: no Complexity: simple 1 objects recovered. Objects recovered: Tick Post-procedure assessment: foreign  body removed Patient tolerance: Patient tolerated the procedure well with no immediate complications. Comments: Applied bacitracin and sterile dressing. No tick parts remain.   (including critical care time)  Labs Reviewed  POCT URINALYSIS DIP (DEVICE) - Abnormal; Notable for the following:    Urobilinogen, UA 4.0 (*)    Leukocytes, UA SMALL (*) Biochemical Testing Only. Please order routine urinalysis from main lab if confirmatory testing is needed.   All other components within normal limits  ROCKY MTN SPOTTED FVR AB, IGM-BLOOD  ROCKY MTN SPOTTED FVR AB, IGG-BLOOD   No results found.   1. Tick bite of knee     Results for orders placed during the hospital encounter of 05/16/12  POCT URINALYSIS DIP (DEVICE)      Component Value Range   Glucose, UA NEGATIVE  NEGATIVE (mg/dL)   Bilirubin Urine NEGATIVE  NEGATIVE    Ketones,  ur NEGATIVE  NEGATIVE (mg/dL)   Specific Gravity, Urine 1.020  1.005 - 1.030    Hgb urine dipstick NEGATIVE  NEGATIVE    pH 6.0  5.0 - 8.0    Protein, ur NEGATIVE  NEGATIVE (mg/dL)   Urobilinogen, UA 4.0 (*) 0.0 - 1.0 (mg/dL)   Nitrite NEGATIVE  NEGATIVE    Leukocytes, UA SMALL (*) NEGATIVE      MDM  Checking UA, and if patient has a significant urinary tract infection, will treat this. If negative, Will contact infectious disease on management of tickborne fever, patient is allergic to tetracyclines. Rxn: syncope.   Discussed with ID on call. Only option to treat tickborne fever is with chloramphenicol 50 mg/KG a day divided in 4 doses for a week. Patient states syncope happened immediately after taking doxycycline, does not recall any airway swelling. Transferring to the ED for a doxy by mouth challenge. If she does fine, she can go home with by mouth doxy. Pt agrees with plan.   Luiz Blare, MD 05/16/12 1537

## 2012-05-16 NOTE — ED Notes (Signed)
ucc transfer.  She had 2 ticks removed from her lt lower leg. The ticks had been on for awhile

## 2012-05-16 NOTE — ED Notes (Signed)
Patient states she is feeling slight tingling of lips.

## 2012-05-16 NOTE — ED Notes (Signed)
MD at bedside. 

## 2012-05-16 NOTE — ED Notes (Signed)
Pt c/o two ticks behind L knee.  Pt states her son attempted remove and one broke in half.  Pt states she has had body aches.

## 2012-05-16 NOTE — ED Provider Notes (Addendum)
History   This chart was scribed for Gwyneth Sprout, MD by Brooks Sailors. The patient was seen in room STRE5/STRE5. Patient's care was started at 1550.   CSN: [id number]  Arrival date & time 05/16/12  1550   First MD Initiated Contact with Patient 05/16/12 1709      Chief Complaint  Patient presents with  . Tick Removal    (Consider location/radiation/quality/duration/timing/severity/associated sxs/prior treatment) HPI  [name] is a 50 y.o. female who presents to the Emergency Department complaining of a tick bite. Pt found a tick on the back of her left knee which was removed today from urgent care. Pt say she does not know how long the tick was on her but notes some fevers in the past few weeks. Pt is highly allergic to doxycycline and is told not to have anything in that medicinal family. Patient had symptoms of headaches and neck pain. Patient says she normally has neck pain from previous surgery. Denies cough, rash.   Dr. Alphonsus Sias PCP  Past Medical History  Diagnosis Date  . Anemia   . GERD (gastroesophageal reflux disease)   . Thyroid disease   . Allergy   . Anxiety   . Depression   . History of DVT (deep vein thrombosis)   . Chronic venous insufficiency   . Greenfield filter in place     Past Surgical History  Procedure Date  . Cesarean section 1998  . Cholecystectomy 1982  . Abdominal hysterectomy   . Tubal ligation 1998  . Tonsillectomy 1965  . Vein ligation 1995/1998    right leg  . Dilation and curettage of uterus 2003  . Biopsy thyroid 01/03    suggestive of Hashimoto's  . Gastric bypass   . Thyroidectomy 2008    left  . Fetal surgery for congenital hernia   . Craniectomy suboccipital w/ cervical laminectomy / chiari 05/2010    repair, Dr.Pool    Family History  Problem Relation Age of Onset  . Arthritis Mother   . Coronary artery disease Father   . Coronary artery disease Sister   . Cancer Maternal Grandmother     Ovarian     History  Substance Use Topics  . Smoking status: Never Smoker   . Smokeless tobacco: Never Used  . Alcohol Use: Yes     rare    OB History    Grav Para Term Preterm Abortions TAB SAB Ect Mult Living                  Review of Systems  Constitutional: Positive for fatigue.  Respiratory: Negative for cough.   Neurological: Positive for headaches.  All other systems reviewed and are negative.    Allergies  Tetracyclines & related; Latex; Ramelteon; and Zolpidem tartrate  Home Medications   Current Outpatient Rx  Name Route Sig Dispense Refill  . CYANOCOBALAMIN 1000 MCG/ML IJ SOLN Intramuscular Inject 1,000 mcg into the muscle every 30 (thirty) days.    . CYCLOBENZAPRINE HCL 10 MG PO TABS  TAKE 1 TABLET (10 MG TOTAL) BY MOUTH 2 (TWO) TIMES DAILY AS NEEDED FOR MUSCLE SPASMS. 60 tablet 0  . EPINEPHRINE 0.15 MG/0.3ML IJ DEVI Intramuscular Inject 0.15 mg into the muscle daily as needed. For severe allergic reaction    . LANSOPRAZOLE 30 MG PO CPDR Oral Take 30 mg by mouth 3 (three) times daily.      Marland Kitchen LEVOTHYROXINE SODIUM 150 MCG PO TABS Oral Take 1 tablet (150 mcg total) by  mouth daily. 90 tablet 3  . DAILY MULTIVITAMINS/IRON PO Oral Take 1 tablet by mouth daily.     Marland Kitchen POLYETHYLENE GLYCOL 3350 PO PACK Oral Take 17 g by mouth daily as needed. For constipation    . TEMAZEPAM 15 MG PO CAPS Oral Take 1-2 capsules (15-30 mg total) by mouth at bedtime as needed. 60 capsule 0  . AMOXICILLIN 500 MG PO TABS Oral Take 2 tablets (1,000 mg total) by mouth 2 (two) times daily. 40 tablet 0    BP 113/67  Pulse 61  Temp 98.2 F (36.8 C)  Resp 16  SpO2 100%  Physical Exam  Nursing note and vitals reviewed. Constitutional: She is oriented to person, place, and time. She appears well-developed and well-nourished. No distress.  HENT:  Head: Normocephalic and atraumatic.  Eyes: EOM are normal.  Neck: Neck supple. No tracheal deviation present.  Cardiovascular: Normal rate.    Pulmonary/Chest: Effort normal and breath sounds normal.  Musculoskeletal: Normal range of motion.       Wound on left posterior knee consistent with tick removal from earlier today. Non tender non erythematous   Neurological: She is alert and oriented to person, place, and time.  Skin: Skin is warm and dry.  Psychiatric: She has a normal mood and affect. Her behavior is normal.    ED Course  Procedures (including critical care time)  Pt seen at 1730   Results for orders placed during the hospital encounter of 05/16/12  POCT URINALYSIS DIP (DEVICE)      Component Value Range   Glucose, UA NEGATIVE  NEGATIVE (mg/dL)   Bilirubin Urine NEGATIVE  NEGATIVE    Ketones, ur NEGATIVE  NEGATIVE (mg/dL)   Specific Gravity, Urine 1.020  1.005 - 1.030    Hgb urine dipstick NEGATIVE  NEGATIVE    pH 6.0  5.0 - 8.0    Protein, ur NEGATIVE  NEGATIVE (mg/dL)   Urobilinogen, UA 4.0 (*) 0.0 - 1.0 (mg/dL)   Nitrite NEGATIVE  NEGATIVE    Leukocytes, UA SMALL (*) NEGATIVE     Labs Reviewed - No data to display No results found.   1. Tick bite   2. [facility] spotted fever       MDM   With multiple tick bites entity could to urgent care however the last week and a half she's had intermittent fevers with headache. She denies any rashes well appearing here with normal vital signs. Patient states that she has a reaction to tetracycline causing her to syncopized. She was told to never take cyclines however the only other treatment [facility] spotted fever is chloramphenicol. Discussed with the patient the severe side effects from chloramphenicol and disability of having a PICC line to get IV antibiotics. Discussed with her starting Benadryl and steroids and then giving her the doxycycline which she was willing to try. After 2 hours patient has had no reaction and feels fine. We'll send her home to take steroids while she is on the antibiotic as well as Benadryl with every antibiotic pill she  takes. She does have an EpiPen at home in case she needs it. Will discharge home.     I personally performed the services described in this documentation, which was scribed in my presence.  The recorded information has been reviewed and considered.      Gwyneth Sprout, MD 05/16/12 1958  Gwyneth Sprout, MD 05/16/12 2002

## 2012-05-16 NOTE — ED Notes (Signed)
Patient states she went to UC and had tick removed from behind her left knee. UC sent patient here for treatment with antibiotics. Patient states she is allergic to doxycycline and have been told by her doctor to never take this medication.  Patient states she was originally bit by a tick x 2 weeks ago and states she has had fever intermittently since the first bit took place.

## 2012-05-16 NOTE — ED Notes (Signed)
Patient resting with NAD at this time. Patient denies any numbness in her lips at this time.

## 2012-05-18 LAB — ROCKY MTN SPOTTED FVR AB, IGM-BLOOD: RMSF IgM: 0.14 IV (ref 0.00–0.89)

## 2012-05-18 LAB — ROCKY MTN SPOTTED FVR AB, IGG-BLOOD: RMSF IgG: 0.06 IV

## 2012-06-02 ENCOUNTER — Other Ambulatory Visit: Payer: Self-pay | Admitting: *Deleted

## 2012-06-02 ENCOUNTER — Other Ambulatory Visit: Payer: Self-pay | Admitting: Internal Medicine

## 2012-06-02 NOTE — Telephone Encounter (Signed)
Okay #60 x 0 

## 2012-06-02 NOTE — Telephone Encounter (Signed)
rx sent to pharmacy by e-script  

## 2012-06-03 MED ORDER — TEMAZEPAM 15 MG PO CAPS: 15.0000 mg | ORAL_CAPSULE | Freq: Every evening | ORAL | Status: DC | PRN

## 2012-06-03 NOTE — Telephone Encounter (Signed)
Okay #60 x 1 

## 2012-06-03 NOTE — Telephone Encounter (Signed)
rx called into pharmacy

## 2012-07-01 ENCOUNTER — Other Ambulatory Visit: Payer: Self-pay | Admitting: *Deleted

## 2012-07-01 MED ORDER — CYCLOBENZAPRINE HCL 10 MG PO TABS: 10.0000 mg | ORAL_TABLET | Freq: Two times a day (BID) | ORAL | Status: DC | PRN

## 2012-07-01 MED ORDER — HYDROCODONE-ACETAMINOPHEN 5-500 MG PO TABS: 1.0000 | ORAL_TABLET | Freq: Four times a day (QID) | ORAL | Status: DC | PRN

## 2012-07-01 NOTE — Telephone Encounter (Signed)
rx called into pharmacy rx sent to pharmacy by e-script  

## 2012-07-01 NOTE — Telephone Encounter (Signed)
Okay cyclobenzaprine #60 x 1 Hydrocodone #60 x 0

## 2012-08-03 ENCOUNTER — Other Ambulatory Visit: Payer: Self-pay | Admitting: *Deleted

## 2012-08-03 MED ORDER — TEMAZEPAM 15 MG PO CAPS: 15.0000 mg | ORAL_CAPSULE | Freq: Every evening | ORAL | Status: DC | PRN

## 2012-08-03 MED ORDER — HYDROCODONE-ACETAMINOPHEN 5-500 MG PO TABS: 1.0000 | ORAL_TABLET | Freq: Four times a day (QID) | ORAL | Status: DC | PRN

## 2012-08-03 NOTE — Telephone Encounter (Signed)
Okay #60 x 0 for each 

## 2012-08-03 NOTE — Telephone Encounter (Signed)
rx called into pharmacy

## 2012-08-25 ENCOUNTER — Telehealth: Payer: Self-pay | Admitting: Internal Medicine

## 2012-08-25 MED ORDER — LORAZEPAM 0.5 MG PO TABS: 0.2500 mg | ORAL_TABLET | Freq: Three times a day (TID) | ORAL | Status: DC | PRN

## 2012-08-25 NOTE — Telephone Encounter (Signed)
Okay lorazepam 0.5mg  1/2-1 tab tid prn for nerves #30 x 0

## 2012-08-25 NOTE — Telephone Encounter (Signed)
rx called into pharmacy Spoke with pt's mother and advised rx phoned into pharmacy

## 2012-08-25 NOTE — Telephone Encounter (Signed)
Pt is wondering if something could be prescribed for to calm down. Her father just passed away and shes having a rough time.

## 2012-09-05 DIAGNOSIS — Z23 Encounter for immunization: Secondary | ICD-10-CM | POA: Diagnosis not present

## 2012-09-22 ENCOUNTER — Other Ambulatory Visit: Payer: Self-pay | Admitting: Internal Medicine

## 2012-09-23 ENCOUNTER — Other Ambulatory Visit: Payer: Self-pay | Admitting: Internal Medicine

## 2012-09-23 NOTE — Telephone Encounter (Signed)
rx called into pharmacy

## 2012-09-23 NOTE — Telephone Encounter (Signed)
[facility] PATIENT 

## 2012-09-23 NOTE — Telephone Encounter (Signed)
plz phone in. 

## 2012-10-14 ENCOUNTER — Ambulatory Visit (INDEPENDENT_AMBULATORY_CARE_PROVIDER_SITE_OTHER): Payer: Medicare Other | Admitting: Internal Medicine

## 2012-10-14 ENCOUNTER — Encounter: Payer: Self-pay | Admitting: Internal Medicine

## 2012-10-14 VITALS — BP 110/80 | HR 68 | Temp 98.3°F | Wt 245.0 lb

## 2012-10-14 DIAGNOSIS — D649 Anemia, unspecified: Secondary | ICD-10-CM

## 2012-10-14 DIAGNOSIS — E039 Hypothyroidism, unspecified: Secondary | ICD-10-CM

## 2012-10-14 DIAGNOSIS — F329 Major depressive disorder, single episode, unspecified: Secondary | ICD-10-CM | POA: Diagnosis not present

## 2012-10-14 DIAGNOSIS — E538 Deficiency of other specified B group vitamins: Secondary | ICD-10-CM | POA: Diagnosis not present

## 2012-10-14 DIAGNOSIS — R5383 Other fatigue: Secondary | ICD-10-CM

## 2012-10-14 DIAGNOSIS — R5381 Other malaise: Secondary | ICD-10-CM | POA: Diagnosis not present

## 2012-10-14 DIAGNOSIS — F3289 Other specified depressive episodes: Secondary | ICD-10-CM

## 2012-10-14 DIAGNOSIS — M549 Dorsalgia, unspecified: Secondary | ICD-10-CM | POA: Diagnosis not present

## 2012-10-14 LAB — CBC WITH DIFFERENTIAL/PLATELET
Basophils Absolute: 0 10*3/uL (ref 0.0–0.1)
HCT: 40.5 % (ref 36.0–46.0)
Lymphocytes Relative: 41 % (ref 12–46)
Lymphs Abs: 2.4 10*3/uL (ref 0.7–4.0)
Monocytes Absolute: 0.5 10*3/uL (ref 0.1–1.0)
Neutro Abs: 2.9 10*3/uL (ref 1.7–7.7)
RBC: 4.31 MIL/uL (ref 3.87–5.11)
RDW: 13 % (ref 11.5–15.5)
WBC: 5.9 10*3/uL (ref 4.0–10.5)

## 2012-10-14 MED ORDER — CYANOCOBALAMIN 1000 MCG/ML IJ SOLN
1000.0000 ug | Freq: Once | INTRAMUSCULAR | Status: AC
  Administered 2012-10-14: 1000 ug via INTRAMUSCULAR

## 2012-10-14 NOTE — Assessment & Plan Note (Signed)
Non specific Probably related to stress and some grieving Will just check labs

## 2012-10-14 NOTE — Assessment & Plan Note (Signed)
Clearly some worse now If related to grieving, meds not appropriate yet

## 2012-10-14 NOTE — Assessment & Plan Note (Signed)
Overusing meds Will have her decrease cyclobenzaprine

## 2012-10-14 NOTE — Addendum Note (Signed)
Addended by: Sueanne Margarita on: 10/14/2012 04:03 PM   Modules accepted: Orders

## 2012-10-14 NOTE — Progress Notes (Signed)
Subjective:    Patient ID: [name], female    DOB: [birth year], 51 y.o.   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 Uses for leg cramps  Mood hasn't been great. Mom is concerned about her grieving Worried about sons' reactions   Current Outpatient Prescriptions on File Prior to Visit  Medication Sig Dispense Refill  . cyanocobalamin (,VITAMIN B-12,) 1000 MCG/ML injection Inject 1,000 mcg into the muscle every 30 (thirty) days.      . cyclobenzaprine (FLEXERIL) 10 MG tablet TAKE 1 TABLET BY MOUTH TWICE DAILY AS NEEDED FOR MUSCLE SPASMS  60 tablet  1  . EPINEPHrine (EPIPEN JR) 0.15 MG/0.3ML injection Inject 0.15 mg into the muscle daily as needed. For severe allergic reaction      . HYDROcodone-acetaminophen (VICODIN) 5-500 MG per tablet TAKE 1-2 TABLETS BY MOUTH EVERY 6 HOURS AS NEEDED  60 tablet  0  . lansoprazole (PREVACID) 30 MG capsule Take 30 mg by mouth 3 (three) times daily.        Marland Kitchen levothyroxine (SYNTHROID, LEVOTHROID) 150 MCG tablet Take 1 tablet (150 mcg total) by mouth daily.  90 tablet  3  . Multiple Vitamins-Iron (DAILY MULTIVITAMINS/IRON PO) Take 1 tablet by mouth daily.       . polyethylene glycol (MIRALAX / GLYCOLAX) packet Take 17 g by mouth daily as needed. For constipation      . temazepam (RESTORIL) 15 MG capsule TAKE 1-2 CAPSULES BY MOUTH AT BEDTIME AS NEEDED  60 capsule  0    Allergies  Allergen Reactions  . Tetracyclines & Related Anaphylaxis    syncope  . Latex     REACTION: "anaphylactic shock"  . Ramelteon     REACTION: blacks out  . Zolpidem Tartrate       REACTION: black outs    Past Medical History  Diagnosis Date  . Anemia   . GERD (gastroesophageal reflux disease)   . Thyroid disease   . Allergy   . Anxiety   . Depression   . History of DVT (deep vein thrombosis)   . Chronic venous insufficiency   . Greenfield filter in place     Past Surgical History  Procedure Date  . Cesarean section 1998  . Cholecystectomy 1982  . Abdominal hysterectomy   . Tubal ligation 1998  . Tonsillectomy 1965  . Vein ligation 1995/1998    right leg  . Dilation and curettage of uterus 2003  . Biopsy thyroid 01/03    suggestive of Hashimoto's  . Gastric bypass   . Thyroidectomy 2008    left  . Fetal surgery for congenital hernia   . Craniectomy suboccipital w/ cervical laminectomy / chiari 05/2010    repair, Dr.Pool    Family History  Problem Relation Age of Onset  . Arthritis Mother   . Coronary artery disease Father   . Coronary artery disease Sister   . Cancer Maternal Grandmother     Ovarian    History   Social History  . Marital Status: Divorced    Spouse Name: N/A    Number of Children: 3  . Years  of Education: N/A   Occupational History  . 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 Smoking status: Never Smoker   . Smokeless tobacco: Never Used  . Alcohol Use: Yes     rare  . Drug Use: No  . Sexually Active: Not on file   Other Topics Concern  . Not on file   Social History Narrative   Still fighting over child supportSSI disabilitiy due to multiple DVT'sPart time job in middle school cafeteriaGoing back to school at [employer]   Review of Systems Has gained weight --stress eating No chest pain No SOB Has had some piercing right back pain--worried about shingles. Red spot comes and goes    Objective:   Physical Exam  Constitutional: She appears well-developed. No distress.  Neck: Normal range of motion. Neck supple. No thyromegaly present.       Healed thyroid scar   Cardiovascular: Normal rate, regular rhythm and normal heart sounds.  Exam reveals no gallop.   No murmur heard. Pulmonary/Chest: Effort normal and breath sounds normal. No respiratory distress. She has no wheezes. She has no rales.  Abdominal: Soft. There is no tenderness.       No HSM  Musculoskeletal:       Marked superficial varicosities but no pitting edema Thick calves  Lymphadenopathy:    She has no cervical adenopathy.    She has no axillary adenopathy.       Right: No inguinal adenopathy present.       Left: No inguinal adenopathy present.  Psychiatric: She has a normal mood and affect. Her behavior is normal.          Assessment & Plan:

## 2012-10-14 NOTE — Assessment & Plan Note (Signed)
Will recheck labs due to fatigue

## 2012-10-15 LAB — HEPATIC FUNCTION PANEL
AST: 16 U/L (ref 0–37)
Bilirubin, Direct: 0.2 mg/dL (ref 0.0–0.3)
Total Bilirubin: 1.4 mg/dL — ABNORMAL HIGH (ref 0.3–1.2)

## 2012-10-15 LAB — T4, FREE: Free T4: 1.41 ng/dL (ref 0.80–1.80)

## 2012-10-15 LAB — BASIC METABOLIC PANEL
Calcium: 9.2 mg/dL (ref 8.4–10.5)
Glucose, Bld: 85 mg/dL (ref 70–99)
Sodium: 144 mEq/L (ref 135–145)

## 2012-10-15 LAB — FERRITIN: Ferritin: 124 ng/mL (ref 10–291)

## 2012-10-21 ENCOUNTER — Other Ambulatory Visit: Payer: Self-pay | Admitting: Family Medicine

## 2012-10-21 NOTE — Telephone Encounter (Signed)
Okay #60 x 0 of each 

## 2012-10-21 NOTE — Telephone Encounter (Signed)
To PCP

## 2012-10-21 NOTE — Telephone Encounter (Signed)
Rx called in as prescribed 

## 2012-11-17 ENCOUNTER — Other Ambulatory Visit: Payer: Self-pay | Admitting: Internal Medicine

## 2012-11-18 NOTE — Telephone Encounter (Signed)
[facility] patient, ok to fill? 

## 2012-11-18 NOTE — Telephone Encounter (Signed)
rx called into pharmacy

## 2012-11-29 ENCOUNTER — Encounter: Payer: Self-pay | Admitting: Cardiovascular Disease

## 2012-11-29 ENCOUNTER — Ambulatory Visit (INDEPENDENT_AMBULATORY_CARE_PROVIDER_SITE_OTHER): Payer: Medicare Other | Admitting: Cardiovascular Disease

## 2012-11-29 VITALS — BP 114/76 | HR 68 | Ht 63.0 in | Wt 255.0 lb

## 2012-11-29 DIAGNOSIS — R600 Localized edema: Secondary | ICD-10-CM

## 2012-11-29 DIAGNOSIS — R609 Edema, unspecified: Secondary | ICD-10-CM

## 2012-11-29 DIAGNOSIS — R002 Palpitations: Secondary | ICD-10-CM

## 2012-11-29 NOTE — Patient Instructions (Addendum)
Your physician recommends that you schedule a follow-up appointment in:  6 weeks.   Your physician has recommended that you wear an event monitor. Event monitors are medical devices that record the heart's electrical activity. Doctors most often [place] these monitors to diagnose arrhythmias. Arrhythmias are problems with the speed or rhythm of the heartbeat. The monitor is a small, portable device. You can wear one while you do your normal daily activities. This is usually used to diagnose what is causing palpitations/syncope (passing out).   Your physician has requested that you have a lower extremity venous duplex. This test is an ultrasound of the veins in the legs . It looks at venous blood flow that carries blood from the heart to the legs.Allow one hour for a Lower Venous exam. There are no restrictions or special instructions.

## 2012-11-29 NOTE — Progress Notes (Signed)
History of Present Illness: 51 yo WF with history of DVT both legs, first in 1996 during trauma, second in 1997 after vein ligation. Coumadin stopped in 2008 by Dr. Samule Ohm because she was non-compliant and there was no evidence of hypercoagulable state. She has had a negative workup within the last 5 years in hematology by Dr. Welton Flakes. She had been seen in the past by Dr. Diona Browner and most recently was seen in our office by me in November 2010 for evaluation of lower extremity swelling. She has also been seen in the past by Dr. Donia Ast in [facility] for vein stripping. Echo December 2010 with normal LV size and function and no significant valvular disease. Venous dopplers April 2011 with chronic left lower ext DVT, no right LE DVT. She has an IVC filter in place.   She tells me that she had an episode today at school where she felt weak and felt her heart racing. She felt dizzy and sweaty. This has happened several times over the last few months. She did not pass out. No chest pain. No SOB. She does have continued left leg pain with some swelling. She has h/o DVT as above with left leg chronic DVT by dopplers 2011, not on coumadin currently.   Primary Care Physician: Alphonsus Sias  Past Medical History  Diagnosis Date  . Anemia   . GERD (gastroesophageal reflux disease)   . Thyroid disease   . Allergy   . Anxiety   . Depression   . History of DVT (deep vein thrombosis)   . Chronic venous insufficiency   . Greenfield filter in place     Past Surgical History  Procedure Date  . Cesarean section 1998  . Cholecystectomy 1982  . Abdominal hysterectomy   . Tubal ligation 1998  . Tonsillectomy 1965  . Vein ligation 1995/1998    right leg  . Dilation and curettage of uterus 2003  . Biopsy thyroid 01/03    suggestive of Hashimoto's  . Gastric bypass   . Thyroidectomy 2008    left  . Fetal surgery for congenital hernia   . Craniectomy suboccipital w/ cervical laminectomy / chiari  05/2010    repair, Dr.Pool    Current Outpatient Prescriptions  Medication Sig Dispense Refill  . cyanocobalamin (,VITAMIN B-12,) 1000 MCG/ML injection Inject 1,000 mcg into the muscle every 30 (thirty) days.      . cyclobenzaprine (FLEXERIL) 10 MG tablet TAKE 1 TABLET BY MOUTH TWICE DAILY AS NEEDED FOR MUSCLE SPASMS  60 tablet  1  . EPINEPHrine (EPIPEN JR) 0.15 MG/0.3ML injection Inject 0.15 mg into the muscle daily as needed. For severe allergic reaction      . HYDROcodone-acetaminophen (VICODIN) 5-500 MG per tablet TAKE 1 TO 2 TABLETS BY MOUTH EVERY 6 HOURS AS NEEDED  60 tablet  0  . lansoprazole (PREVACID) 30 MG capsule Take 30 mg by mouth 3 (three) times daily.        Marland Kitchen levothyroxine (SYNTHROID, LEVOTHROID) 150 MCG tablet Take 1 tablet (150 mcg total) by mouth daily.  90 tablet  3  . Multiple Vitamins-Iron (DAILY MULTIVITAMINS/IRON PO) Take 1 tablet by mouth daily.       . polyethylene glycol (MIRALAX / GLYCOLAX) packet Take 17 g by mouth daily as needed. For constipation      . temazepam (RESTORIL) 15 MG capsule TAKE 1 TO 2 CAPSULES BY MOUTH AT BEDTIME  60 capsule  0    Allergies  Allergen Reactions  . Tetracyclines &  Related Anaphylaxis    syncope  . Latex     REACTION: "anaphylactic shock"  . Ramelteon     REACTION: blacks out  . Zolpidem Tartrate     REACTION: black outs    History   Social History  . Marital Status: Divorced    Spouse Name: N/A    Number of Children: 3  . Years of Education: N/A   Occupational History  . 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 Smoking status: Never Smoker   . Smokeless tobacco: Never Used  . Alcohol Use: Yes     Comment: rare  . Drug Use: No  . Sexually Active: Not on file   Other Topics Concern  . Not on file   Social History Narrative   Still fighting over child supportSSI disabilitiy due to multiple DVT'sPart time job in middle school cafeteriaGoing back to school at [employer]     Family History  Problem Relation Age of Onset  . Arthritis Mother   . Coronary artery disease Father   . Coronary artery disease Sister   . Cancer Maternal Grandmother     Ovarian    Review of Systems:  As stated in the HPI and otherwise negative.   BP 114/76  Pulse 68  Ht 5\' 3"  (1.6 m)  Wt 255 lb (115.667 kg)  BMI 45.17 kg/m2  Physical Examination: General: Well developed, well nourished, NAD HEENT: OP clear, mucus membranes moist SKIN: warm, dry. No rashes. Neuro: No focal deficits Musculoskeletal: Muscle strength 5/5 all ext Psychiatric: Mood and affect normal Neck: No JVD, no carotid bruits, no thyromegaly, no lymphadenopathy. Lungs:Clear bilaterally, no wheezes, rhonci, crackles Cardiovascular: Regular rate and rhythm. No murmurs, gallops or rubs. Abdomen:Soft. Bowel sounds present. Non-tender.  Extremities: Trace lower extremity edema today.  Pulses are 2 + in the bilateral DP/PT.  EKG: NSR, rate 68 bpm. Normal EKG  Echo 12/04/09:  Left ventricle: The cavity size was normal. Wall thickness was normal. Systolic function was normal. The estimated ejection fraction was in the range of 55% to 60%. Wall motion was normal; there were no regional wall motion abnormalities. Doppler parameters are consistent with abnormal left ventricular relaxation (grade 1 diastolic dysfunction).   Venous dopplers April 2011:  No evidence of deep or superficial vein thrombosis involving the right lower extremity. - Findings consistent with chronic deep and superficialvein thrombosis involving the left lower extremity. - No evidence of Baker's cyst on the right or left.   Assessment and Plan:   1. Palpitations: Will arrange 21 day event monitor.   2. LE edema/history of DVT/left lower ext pain: Will arrange bilateral lower ext venous dopplers. She may need to go back on coumadin if she continues to have obstructive DVT in the left leg (see report of dopplers from 2011 with  chronic left leg thrombus). IVC filter in place since 2004. Will not repeat echo, normal 2010.

## 2012-12-01 ENCOUNTER — Encounter: Payer: Self-pay | Admitting: Internal Medicine

## 2012-12-01 ENCOUNTER — Ambulatory Visit (INDEPENDENT_AMBULATORY_CARE_PROVIDER_SITE_OTHER): Payer: Medicare Other | Admitting: Internal Medicine

## 2012-12-01 VITALS — BP 108/70 | HR 78 | Temp 98.4°F | Wt 255.0 lb

## 2012-12-01 DIAGNOSIS — R220 Localized swelling, mass and lump, head: Secondary | ICD-10-CM | POA: Diagnosis not present

## 2012-12-01 DIAGNOSIS — R0602 Shortness of breath: Secondary | ICD-10-CM

## 2012-12-01 DIAGNOSIS — E538 Deficiency of other specified B group vitamins: Secondary | ICD-10-CM

## 2012-12-01 DIAGNOSIS — K148 Other diseases of tongue: Secondary | ICD-10-CM

## 2012-12-01 DIAGNOSIS — R221 Localized swelling, mass and lump, neck: Secondary | ICD-10-CM | POA: Diagnosis not present

## 2012-12-01 MED ORDER — CYANOCOBALAMIN 1000 MCG/ML IJ SOLN
1000.0000 ug | Freq: Once | INTRAMUSCULAR | Status: AC
  Administered 2012-12-01: 1000 ug via INTRAMUSCULAR

## 2012-12-01 NOTE — Progress Notes (Signed)
Subjective:    Patient ID: [name], female    DOB: [birth year], 51 y.o.   MRN: [id number]  HPI Had episode of nearly fainting, palpitations, etc ---at school Saw Dr Rosalie Gums Dopplers on legs planned and event monitor  Yesterday, started feeling heavy in chest while at school Felt SOB While walking in hall and ramp Decided to jut go home Went away on its own but then had the same sweating and panic like feeling after This resolved after an hour or so No true chest pain Chronic leg edema without change  Has lump in tip of tongue Has noticed for about 3 weeks Slightly tender but not painful other wise  Tried cutting down on cyclobenzaprine but wasn't sleeping Now back to 2 at bedtime  Current Outpatient Prescriptions on File Prior to Visit  Medication Sig Dispense Refill  . cyanocobalamin (,VITAMIN B-12,) 1000 MCG/ML injection Inject 1,000 mcg into the muscle every 30 (thirty) days.      . cyclobenzaprine (FLEXERIL) 10 MG tablet TAKE 1 TABLET BY MOUTH TWICE DAILY AS NEEDED FOR MUSCLE SPASMS  60 tablet  1  . EPINEPHrine (EPIPEN JR) 0.15 MG/0.3ML injection Inject 0.15 mg into the muscle daily as needed. For severe allergic reaction      . HYDROcodone-acetaminophen (VICODIN) 5-500 MG per tablet TAKE 1 TO 2 TABLETS BY MOUTH EVERY 6 HOURS AS NEEDED  60 tablet  0  . lansoprazole (PREVACID) 30 MG capsule Take 30 mg by mouth 3 (three) times daily.        Marland Kitchen levothyroxine (SYNTHROID, LEVOTHROID) 150 MCG tablet Take 1 tablet (150 mcg total) by mouth daily.  90 tablet  3  . Multiple Vitamins-Iron (DAILY MULTIVITAMINS/IRON PO) Take 1 tablet by mouth daily.       . polyethylene glycol (MIRALAX / GLYCOLAX) packet Take 17 g by mouth daily as needed. For constipation      . temazepam (RESTORIL) 15 MG capsule TAKE 1 TO 2 CAPSULES BY MOUTH AT BEDTIME  60 capsule  0   No current facility-administered medications on file prior to visit.    Allergies  Allergen Reactions  . Tetracyclines &  Related Anaphylaxis    syncope  . Latex     REACTION: "anaphylactic shock"  . Ramelteon     REACTION: blacks out  . Zolpidem Tartrate     REACTION: black outs    Past Medical History  Diagnosis Date  . Anemia   . GERD (gastroesophageal reflux disease)   . Thyroid disease   . Allergy   . Anxiety   . Depression   . History of DVT (deep vein thrombosis)   . Chronic venous insufficiency   . Greenfield filter in place     Past Surgical History  Procedure Date  . Cesarean section 1998  . Cholecystectomy 1982  . Abdominal hysterectomy   . Tubal ligation 1998  . Tonsillectomy 1965  . Vein ligation 1995/1998    right leg  . Dilation and curettage of uterus 2003  . Biopsy thyroid 01/03    suggestive of Hashimoto's  . Gastric bypass   . Thyroidectomy 2008    left  . Fetal surgery for congenital hernia   . Craniectomy suboccipital w/ cervical laminectomy / chiari 05/2010    repair, Dr.Pool    Family History  Problem Relation Age of Onset  . Arthritis Mother   . Coronary artery disease Father   . Coronary artery disease Sister   . Cancer Maternal Grandmother  Ovarian    History   Social History  . Marital Status: Divorced    Spouse Name: N/A    Number of Children: 3  . Years of Education: N/A   Occupational History  . 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 Smoking status: Never Smoker   . Smokeless tobacco: Never Used  . Alcohol Use: Yes     Comment: rare  . Drug Use: No  . Sexually Active: Not on file   Other Topics Concern  . Not on file   Social History Narrative   Still fighting over child supportSSI disabilitiy due to multiple DVT'sPart time job in middle school cafeteriaGoing back to school at [employer]   Review of Systems Appetite isn't great but tends to snack and eat a lot at night Knows she has gained weight Sleep is not great---- 4-5 hours and then can't get back No cough No fever Doesn't feel  sick    Objective:   Physical Exam  Constitutional: She appears well-developed and well-nourished. No distress.  HENT:       Discrete ~47mm mass in tip of tongue  Neck: Normal range of motion. Neck supple.  Cardiovascular: Normal rate, regular rhythm and normal heart sounds.  Exam reveals no gallop.   No murmur heard. Pulmonary/Chest: Effort normal and breath sounds normal. No respiratory distress. She has no wheezes. She has no rales.  Musculoskeletal: She exhibits edema.       2-3+ non pitting edema in calves  Lymphadenopathy:    She has no cervical adenopathy.  Psychiatric: She has a normal mood and affect. Her behavior is normal.          Assessment & Plan:

## 2012-12-01 NOTE — Assessment & Plan Note (Signed)
Vague and brief No signs of infection No bronchospasm Cardiology eval just done  Will hold off on other studies unless recurrent symptoms Discussed fitness efforts and better eating now

## 2012-12-01 NOTE — Assessment & Plan Note (Signed)
Seems very discrete and probably benign cyst If grows or persists, will set up ENT eval

## 2012-12-05 ENCOUNTER — Telehealth: Payer: Self-pay | Admitting: Cardiovascular Disease

## 2012-12-05 DIAGNOSIS — R0602 Shortness of breath: Secondary | ICD-10-CM

## 2012-12-05 NOTE — Telephone Encounter (Signed)
Spoke with pt. She reports she had an imbedded tick at the end of May and was diagnosed with [facility] Spotted fever.  She was pre treated at the time for doxycycline allergy and then started on course of doxycycline.  She recently read an article regarding lyme disease being tied to heart disease and shortness of breath.  She is concerned about possibility of this.  She is going to fax article to our office for review.  She is  is requesting Dr. Clifton James order an echo.  She had an episode of shortness of breath and anxiety the day after seeing Dr. Clifton James and saw Dr. Alphonsus Sias for this on December 01, 2012. States she still has episodes of shortness of breath but not as severe as when she saw Dr. Alphonsus Sias.  I told pt I would watch for fax and send note to Dr. Clifton James regarding echo.

## 2012-12-05 NOTE — Telephone Encounter (Signed)
New problem:   Need to discuss SOB. Read an article regarding a tick. H/O [place] spot fever end of may.

## 2012-12-06 NOTE — Telephone Encounter (Signed)
Dennie Bible, We can arrange an echo with diagnosis of SOB. Thanks, chris

## 2012-12-07 NOTE — Telephone Encounter (Signed)
Spoke with pt and gave her this information. Will ask scheduling to contact pt to arrange echo.  Can be reached at work number until noon and then can be reached at cell--[phone]

## 2012-12-12 ENCOUNTER — Other Ambulatory Visit: Payer: Self-pay | Admitting: Family Medicine

## 2012-12-12 ENCOUNTER — Ambulatory Visit (HOSPITAL_COMMUNITY): Payer: Medicare Other | Attending: Cardiology

## 2012-12-12 DIAGNOSIS — R0609 Other forms of dyspnea: Secondary | ICD-10-CM | POA: Diagnosis not present

## 2012-12-12 DIAGNOSIS — I517 Cardiomegaly: Secondary | ICD-10-CM | POA: Diagnosis not present

## 2012-12-12 DIAGNOSIS — R0989 Other specified symptoms and signs involving the circulatory and respiratory systems: Secondary | ICD-10-CM

## 2012-12-12 DIAGNOSIS — R609 Edema, unspecified: Secondary | ICD-10-CM | POA: Diagnosis not present

## 2012-12-12 DIAGNOSIS — R002 Palpitations: Secondary | ICD-10-CM | POA: Diagnosis not present

## 2012-12-12 DIAGNOSIS — A779 Spotted fever, unspecified: Secondary | ICD-10-CM | POA: Diagnosis not present

## 2012-12-12 DIAGNOSIS — R0602 Shortness of breath: Secondary | ICD-10-CM

## 2012-12-12 NOTE — Progress Notes (Signed)
Echocardiogram performed.  

## 2012-12-13 ENCOUNTER — Encounter (INDEPENDENT_AMBULATORY_CARE_PROVIDER_SITE_OTHER): Payer: Medicare Other

## 2012-12-13 DIAGNOSIS — Z86718 Personal history of other venous thrombosis and embolism: Secondary | ICD-10-CM | POA: Diagnosis not present

## 2012-12-13 DIAGNOSIS — I87009 Postthrombotic syndrome without complications of unspecified extremity: Secondary | ICD-10-CM

## 2012-12-13 DIAGNOSIS — R600 Localized edema: Secondary | ICD-10-CM

## 2012-12-13 DIAGNOSIS — R609 Edema, unspecified: Secondary | ICD-10-CM

## 2012-12-13 DIAGNOSIS — R002 Palpitations: Secondary | ICD-10-CM | POA: Diagnosis not present

## 2012-12-13 NOTE — Telephone Encounter (Signed)
rx called into pharmacy

## 2012-12-13 NOTE — Telephone Encounter (Signed)
Okay #60 x 0 for each 

## 2012-12-13 NOTE — Progress Notes (Signed)
Patient ID: [name], female   DOB: 1961-08-24, 51 y.o.   MRN: [id number] PLACED AN EVENT MONITOR ON PATIENT ON 12/13/12, ALSO WENT OVER INSTRUCTIONS ON HOW TO USE IT AND WHEN TO RETURN IT

## 2012-12-20 ENCOUNTER — Encounter: Payer: Self-pay | Admitting: Family Medicine

## 2012-12-20 ENCOUNTER — Ambulatory Visit (INDEPENDENT_AMBULATORY_CARE_PROVIDER_SITE_OTHER): Payer: Medicare Other | Admitting: Family Medicine

## 2012-12-20 ENCOUNTER — Ambulatory Visit (INDEPENDENT_AMBULATORY_CARE_PROVIDER_SITE_OTHER)
Admission: RE | Admit: 2012-12-20 | Discharge: 2012-12-20 | Disposition: A | Discharge status: Home / Self Care | Payer: Medicare Other | Source: Ambulatory Visit | Attending: Family Medicine | Admitting: Family Medicine

## 2012-12-20 VITALS — BP 102/66 | HR 73 | Temp 98.2°F | Ht 63.0 in | Wt 255.8 lb

## 2012-12-20 DIAGNOSIS — R071 Chest pain on breathing: Secondary | ICD-10-CM

## 2012-12-20 DIAGNOSIS — S298XXA Other specified injuries of thorax, initial encounter: Secondary | ICD-10-CM | POA: Diagnosis not present

## 2012-12-20 DIAGNOSIS — R079 Chest pain, unspecified: Secondary | ICD-10-CM | POA: Diagnosis not present

## 2012-12-20 DIAGNOSIS — R0789 Other chest pain: Secondary | ICD-10-CM

## 2012-12-20 NOTE — Assessment & Plan Note (Addendum)
S/p fall on L trunk/ chest wall with pain but no crepitus or bruising  Disc prev of atelectasis with deep breaths/ cough  cxr and rib films today - neg for fx or abnormality Pt has hydrocodone for pain to use prn

## 2012-12-20 NOTE — Progress Notes (Signed)
Subjective:    Patient ID: [name], female    DOB: Sep 04, 1961, 51 y.o.   MRN: [id number]  HPI Had a fall mid day yesterday - tripped on a runner on the floor  She fell forward - directly on to her chest / especially the L and also R knee Hurts to take a deep breath on L  A little pain under L arm  Some in knee   Some redness on her chest   No cough   Did not hit her head   Patient Active Problem List  Diagnosis  . COLONIC POLYPS  . HYPOTHYROIDISM  . B12 DEFICIENCY  . MORBID OBESITY  . ANEMIA-NOS  . ANXIETY  . DEPRESSION  . MIGRAINE HEADACHE  . VENOUS INSUFFICIENCY, CHRONIC  . ALLERGIC RHINITIS  . GERD  . ARNOLD-CHIARI MALFORMATION  . SLEEP DISORDER, CHRONIC  . URINARY INCONTINENCE  . DVT, HX OF  . BACK PAIN  . Mild cognitive impairment  . Routine general medical examination at a health care facility  . Fatigue  . SOB (shortness of breath)  . Tongue mass   Past Medical History  Diagnosis Date  . Anemia   . GERD (gastroesophageal reflux disease)   . Thyroid disease   . Allergy   . Anxiety   . Depression   . History of DVT (deep vein thrombosis)   . Chronic venous insufficiency   . Greenfield filter in place    Past Surgical History  Procedure Date  . Cesarean section 1998  . Cholecystectomy 1982  . Abdominal hysterectomy   . Tubal ligation 1998  . Tonsillectomy 1965  . Vein ligation 1995/1998    right leg  . Dilation and curettage of uterus 2003  . Biopsy thyroid 01/03    suggestive of Hashimoto's  . Gastric bypass   . Thyroidectomy 2008    left  . Fetal surgery for congenital hernia   . Craniectomy suboccipital w/ cervical laminectomy / chiari 05/2010    repair, Dr.Pool   History  Substance Use Topics  . Smoking status: Never Smoker   . Smokeless tobacco: Never Used  . Alcohol Use: Yes     Comment: rare   Family History  Problem Relation Age of Onset  . Arthritis Mother   . Coronary artery disease Father   . Coronary artery  disease Sister   . Cancer Maternal Grandmother     Ovarian   Allergies  Allergen Reactions  . Tetracyclines & Related Anaphylaxis    syncope  . Latex     REACTION: "anaphylactic shock"  . Ramelteon     REACTION: blacks out  . Zolpidem Tartrate     REACTION: black outs   Current Outpatient Prescriptions on File Prior to Visit  Medication Sig Dispense Refill  . cyanocobalamin (,VITAMIN B-12,) 1000 MCG/ML injection Inject 1,000 mcg into the muscle every 30 (thirty) days.      . cyclobenzaprine (FLEXERIL) 10 MG tablet TAKE 1 TABLET BY MOUTH TWICE DAILY AS NEEDED FOR MUSCLE SPASMS  60 tablet  1  . EPINEPHrine (EPIPEN JR) 0.15 MG/0.3ML injection Inject 0.15 mg into the muscle daily as needed. For severe allergic reaction      . HYDROcodone-acetaminophen (VICODIN) 5-500 MG per tablet TAKE 1 TO 2 TAB BY MOUTH EVERY 6 HOURS AS NEEDED  60 tablet  0  . lansoprazole (PREVACID) 30 MG capsule Take 30 mg by mouth 3 (three) times daily as needed.       Marland Kitchen levothyroxine (  SYNTHROID, LEVOTHROID) 150 MCG tablet Take 1 tablet (150 mcg total) by mouth daily.  90 tablet  3  . LORazepam (ATIVAN) 0.5 MG tablet Take 0.5 mg by mouth as needed.       . Multiple Vitamins-Iron (DAILY MULTIVITAMINS/IRON PO) Take 1 tablet by mouth daily.       . polyethylene glycol (MIRALAX / GLYCOLAX) packet Take 17 g by mouth daily as needed. For constipation      . temazepam (RESTORIL) 15 MG capsule TAKE 1 TO 2 CAPSULE AT BEDTIME  60 capsule  0      Review of Systems Review of Systems  Constitutional: Negative for fever, appetite change, fatigue and unexpected weight change.  Eyes: Negative for pain and visual disturbance.  Respiratory: Negative for cough and shortness of breath.   Cardiovascular: Negative for cp or palpitations  Pos for chest wall pain and tenderness  Gastrointestinal: Negative for nausea, diarrhea and constipation.  Genitourinary: Negative for urgency and frequency.  Skin: Negative for pallor or rash     Neurological: Negative for weakness, light-headedness, numbness and headaches.  Hematological: Negative for adenopathy. Does not bruise/bleed easily.  Psychiatric/Behavioral: Negative for dysphoric mood. The patient is not nervous/anxious.         Objective:   Physical Exam  Constitutional: She appears well-developed and well-nourished. No distress.       Obese and well appearing   HENT:  Head: Normocephalic and atraumatic.  Mouth/Throat: Oropharynx is clear and moist.  Eyes: Conjunctivae normal and EOM are normal. Pupils are equal, round, and reactive to light.  Neck: Normal range of motion. Neck supple.  Cardiovascular: Normal rate and regular rhythm.   Pulmonary/Chest: Effort normal and breath sounds normal. No respiratory distress. She has no wheezes. She has no rales. She exhibits tenderness.       L chest wall tenderness lateral without crepitus or skin change  bs are even and air exch is good    Abdominal: Soft. Bowel sounds are normal. She exhibits no distension. There is no tenderness.  Genitourinary: There is breast tenderness. No breast swelling, discharge or bleeding.       L breast is diffusely tender without bruising or swelling or mass  Musculoskeletal: She exhibits tenderness.       Tender L chest wall/ ribs Nl rom all joints   Lymphadenopathy:    She has no cervical adenopathy.  Neurological: She is alert. She has normal reflexes. No cranial nerve deficit.  Skin: Skin is warm and dry. No rash noted. No erythema. No pallor.  Psychiatric: She has a normal mood and affect.          Assessment & Plan:

## 2012-12-20 NOTE — Patient Instructions (Addendum)
xrays today I think you have some contused ribs  Take your hydrocodone as needed  Alternate cool and warm compresses 10 minutes at a time  Take deep breaths about every 30 minutes If any shortness of breath or cough or fever, update [place]

## 2013-01-02 ENCOUNTER — Telehealth: Payer: Self-pay | Admitting: *Deleted

## 2013-01-02 NOTE — Telephone Encounter (Signed)
I called pt to review event monitor. Left message to call back

## 2013-01-02 NOTE — Telephone Encounter (Signed)
Spoke with pt who reports she is still wearing monitor. Monitor was placed on 12/24.  I told pt I would follow up and call her back

## 2013-01-02 NOTE — Telephone Encounter (Signed)
Reviewed with Delice Bison and monitor enrollment period was for 2 weeks.  Pt is also complaining of rash and itching from patches.  I told her she could remove monitor and send it in. Results reviewed with pt.

## 2013-01-12 ENCOUNTER — Other Ambulatory Visit: Payer: Self-pay | Admitting: Internal Medicine

## 2013-01-13 NOTE — Telephone Encounter (Signed)
rx called into pharmacy

## 2013-01-13 NOTE — Telephone Encounter (Signed)
Okay #60 x 0 for each 

## 2013-01-20 ENCOUNTER — Encounter: Payer: Self-pay | Admitting: Cardiovascular Disease

## 2013-01-20 ENCOUNTER — Ambulatory Visit (INDEPENDENT_AMBULATORY_CARE_PROVIDER_SITE_OTHER): Payer: Medicare Other | Admitting: Cardiovascular Disease

## 2013-01-20 VITALS — BP 113/74 | HR 67 | Ht 64.0 in | Wt 262.0 lb

## 2013-01-20 DIAGNOSIS — R002 Palpitations: Secondary | ICD-10-CM | POA: Diagnosis not present

## 2013-01-20 DIAGNOSIS — I82409 Acute embolism and thrombosis of unspecified deep veins of unspecified lower extremity: Secondary | ICD-10-CM | POA: Diagnosis not present

## 2013-01-20 DIAGNOSIS — O2230 Deep phlebothrombosis in pregnancy, unspecified trimester: Secondary | ICD-10-CM

## 2013-01-20 NOTE — Patient Instructions (Addendum)
Your physician wants you to follow-up in:  6 months. You will receive a reminder letter in the mail two months in advance. If you don't receive a letter, please call our office to schedule the follow-up appointment.   

## 2013-01-20 NOTE — Progress Notes (Signed)
History of Present Illness: 52 yo WF with history of DVT both legs, first in 1996 during trauma, second in 1997 after vein ligation as well as chronic lower extremity edema who was last seen here in December 2013. She had been on coumadin but this was stopped in 2008 by Dr. Samule Ohm because she was non-compliant and there was no evidence of hypercoagulable state. She has had a negative workup within the last 5 years in hematology by Dr. Welton Flakes. She has also been seen in the past by Dr. Donia Ast in [facility] for vein stripping. She had prior vein stripping in [place]. Echo December 2010 with normal LV size and function and no significant valvular disease. Venous dopplers April 2011 with chronic left lower ext DVT, no right LE DVT. She has an IVC filter in place. When I saw her last month, she told me that she had been feeling weak and had felt her heart racing. She felt dizzy and sweaty. This has happened several times over the last few months. She did not pass out. No chest pain. No SOB. She does have continued left leg pain with some swelling. She has h/o DVT as above with left leg chronic DVT by dopplers 2011, not on coumadin currently. Because of her palpitations, I arranged a 21 day event monitor which did not show any evidence of PACs, PVCs, SVT or atrial fibrillation. Venous dopplers showed chronic, non-occlusive thrombus isolated to the left popliteal vein but no thrombus in the right lower extremity venous system. Echo with normal LV size and function, grade 1 diastolic dysfunction, mildly dilated left atrium.   She is here today for follow up. She is doing well overall. She has no change in her LE swelling. No palpitations. Rare sharp chest pains that last for 2 seconds.   Primary Care Physician: Alphonsus Sias  Last Lipid Profile:Lipid Panel  No results found for this basename: chol, trig, hdl, cholhdl, vldl, ldlcalc     Past Medical History  Diagnosis Date  . Anemia   . GERD  (gastroesophageal reflux disease)   . Thyroid disease   . Allergy   . Anxiety   . Depression   . History of DVT (deep vein thrombosis)   . Chronic venous insufficiency   . Greenfield filter in place     Past Surgical History  Procedure Date  . Cesarean section 1998  . Cholecystectomy 1982  . Abdominal hysterectomy   . Tubal ligation 1998  . Tonsillectomy 1965  . Vein ligation 1995/1998    right leg  . Dilation and curettage of uterus 2003  . Biopsy thyroid 01/03    suggestive of Hashimoto's  . Gastric bypass   . Thyroidectomy 2008    left  . Fetal surgery for congenital hernia   . Craniectomy suboccipital w/ cervical laminectomy / chiari 05/2010    repair, Dr.Pool    Current Outpatient Prescriptions  Medication Sig Dispense Refill  . cyanocobalamin (,VITAMIN B-12,) 1000 MCG/ML injection Inject 1,000 mcg into the muscle every 30 (thirty) days.      . cyclobenzaprine (FLEXERIL) 10 MG tablet TAKE 1 TABLET BY MOUTH TWICE DAILY AS NEEDED FOR MUSCLE SPASMS  60 tablet  1  . EPINEPHrine (EPIPEN JR) 0.15 MG/0.3ML injection Inject 0.15 mg into the muscle daily as needed. For severe allergic reaction      . HYDROcodone-acetaminophen (VICODIN) 5-500 MG per tablet TAKE 1 TO 2 TAB BY MOUTH EVERY 6 HOURS AS NEEDED  60 tablet  0  .  lansoprazole (PREVACID) 30 MG capsule Take 30 mg by mouth 3 (three) times daily as needed.       Marland Kitchen levothyroxine (SYNTHROID, LEVOTHROID) 150 MCG tablet Take 1 tablet (150 mcg total) by mouth daily.  90 tablet  3  . LORazepam (ATIVAN) 0.5 MG tablet Take 0.5 mg by mouth as needed.       . Multiple Vitamins-Iron (DAILY MULTIVITAMINS/IRON PO) Take 1 tablet by mouth daily.       . polyethylene glycol (MIRALAX / GLYCOLAX) packet Take 17 g by mouth daily as needed. For constipation      . temazepam (RESTORIL) 15 MG capsule TAKE 1 TO 2 CAPSULE AT BEDTIME  60 capsule  0    Allergies  Allergen Reactions  . Tetracyclines & Related Anaphylaxis    syncope  . Latex      REACTION: "anaphylactic shock"  . Ramelteon     REACTION: blacks out  . Zolpidem Tartrate     REACTION: black outs    History   Social History  . Marital Status: Divorced    Spouse Name: N/A    Number of Children: 3  . Years of Education: N/A   Occupational History  . 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 Smoking status: Never Smoker   . Smokeless tobacco: Never Used  . Alcohol Use: Yes     Comment: rare  . Drug Use: No  . Sexually Active: Not on file   Other Topics Concern  . Not on file   Social History Narrative   Still fighting over child supportSSI disabilitiy due to multiple DVT'sPart time job in middle school cafeteriaGoing back to school at [employer]    Family History  Problem Relation Age of Onset  . Arthritis Mother   . Coronary artery disease Father   . Coronary artery disease Sister   . Cancer Maternal Grandmother     Ovarian    Review of Systems:  As stated in the HPI and otherwise negative.   BP 113/74  Pulse 67  Ht 5\' 4"  (1.626 m)  Wt 262 lb (118.842 kg)  BMI 44.97 kg/m2  Physical Examination: General: Well developed, well nourished, NAD HEENT: OP clear, mucus membranes moist SKIN: warm, dry. No rashes. Neuro: No focal deficits Musculoskeletal: Muscle strength 5/5 all ext Psychiatric: Mood and affect normal Neck: No JVD, no carotid bruits, no thyromegaly, no lymphadenopathy. Lungs:Clear bilaterally, no wheezes, rhonci, crackles Cardiovascular: Regular rate and rhythm. No murmurs, gallops or rubs. Abdomen:Soft. Bowel sounds present. Non-tender.  Extremities: Trace RLE edema, 1+ left lower extremity edema-unchanged. Pulses are 2 + in the bilateral DP/PT.  Echo 12/12/12: Left ventricle: The cavity size was normal. Wall thickness was normal. Systolic function was normal. The estimated ejection fraction was in the range of 55% to 65%. Wall motion was normal; there were no regional wall  motion abnormalities. Doppler parameters are consistent with abnormal left ventricular relaxation (grade 1 diastolic dysfunction). - Left atrium: The atrium was mildly dilated. - Atrial septum: No defect or patent foramen ovale was identified.  Assessment and Plan:   1. Palpitations: No arrythmias noted on 21 day event monitor. LV function is normal.  2. LE edema/history of DVT/left lower ext pain: Venous dopplers with chronic, non-occlusive left popliteal vein thrombus but no right lower ext venous thrombus. IVC filter in place since 2004. She has been off of coumadin for years. She does not wish to restart. No acute indication for coumadin.  If she develops recurrence of acute DVT, she will need to restart coumadin. She will elevate her legs when she can and will wear her compression stockings.

## 2013-01-23 ENCOUNTER — Other Ambulatory Visit: Payer: Self-pay | Admitting: Family Medicine

## 2013-01-24 NOTE — Telephone Encounter (Signed)
rx sent to pharmacy by e-script  

## 2013-01-24 NOTE — Telephone Encounter (Signed)
Okay #60 x 1 

## 2013-01-30 ENCOUNTER — Telehealth: Payer: Self-pay | Admitting: *Deleted

## 2013-01-30 NOTE — Telephone Encounter (Signed)
Called to speak with patient about medications Temazepam and Hydrocodone not being on her formulary and will no longer be covered. I advised pt she needs to contact her insurance to see what would be covered, per pt she will and will call back with names of meds.  ( see scanned form)

## 2013-02-04 ENCOUNTER — Other Ambulatory Visit: Payer: Self-pay

## 2013-03-19 ENCOUNTER — Other Ambulatory Visit: Payer: Self-pay | Admitting: Internal Medicine

## 2013-03-27 ENCOUNTER — Other Ambulatory Visit: Payer: Self-pay | Admitting: Internal Medicine

## 2013-03-28 NOTE — Telephone Encounter (Signed)
Sent 03/20/13

## 2013-05-16 DIAGNOSIS — M5137 Other intervertebral disc degeneration, lumbosacral region: Secondary | ICD-10-CM | POA: Diagnosis not present

## 2013-05-16 DIAGNOSIS — M545 Low back pain, unspecified: Secondary | ICD-10-CM | POA: Diagnosis not present

## 2013-05-24 ENCOUNTER — Other Ambulatory Visit: Payer: Self-pay | Admitting: Internal Medicine

## 2013-05-24 NOTE — Telephone Encounter (Signed)
Okay #60 x 1 Overdue for physical Have her schedule within the next 4-6 months

## 2013-05-24 NOTE — Telephone Encounter (Signed)
rx sent to pharmacy by e-script .left message to have patient return my call.  

## 2013-05-27 ENCOUNTER — Other Ambulatory Visit: Payer: Self-pay | Admitting: Internal Medicine

## 2013-08-01 DIAGNOSIS — H40009 Preglaucoma, unspecified, unspecified eye: Secondary | ICD-10-CM | POA: Diagnosis not present

## 2013-08-02 IMAGING — CR DG RIBS 2V*L*
4 series · 4 of 4 positions shown · non-contrast
Comparison: Chest x-ray performed today and 08/14/2010.

CLINICAL DATA: Fall, left rib pain.

LEFT RIBS - 2 VIEW

[view not recorded (1 of 4)]
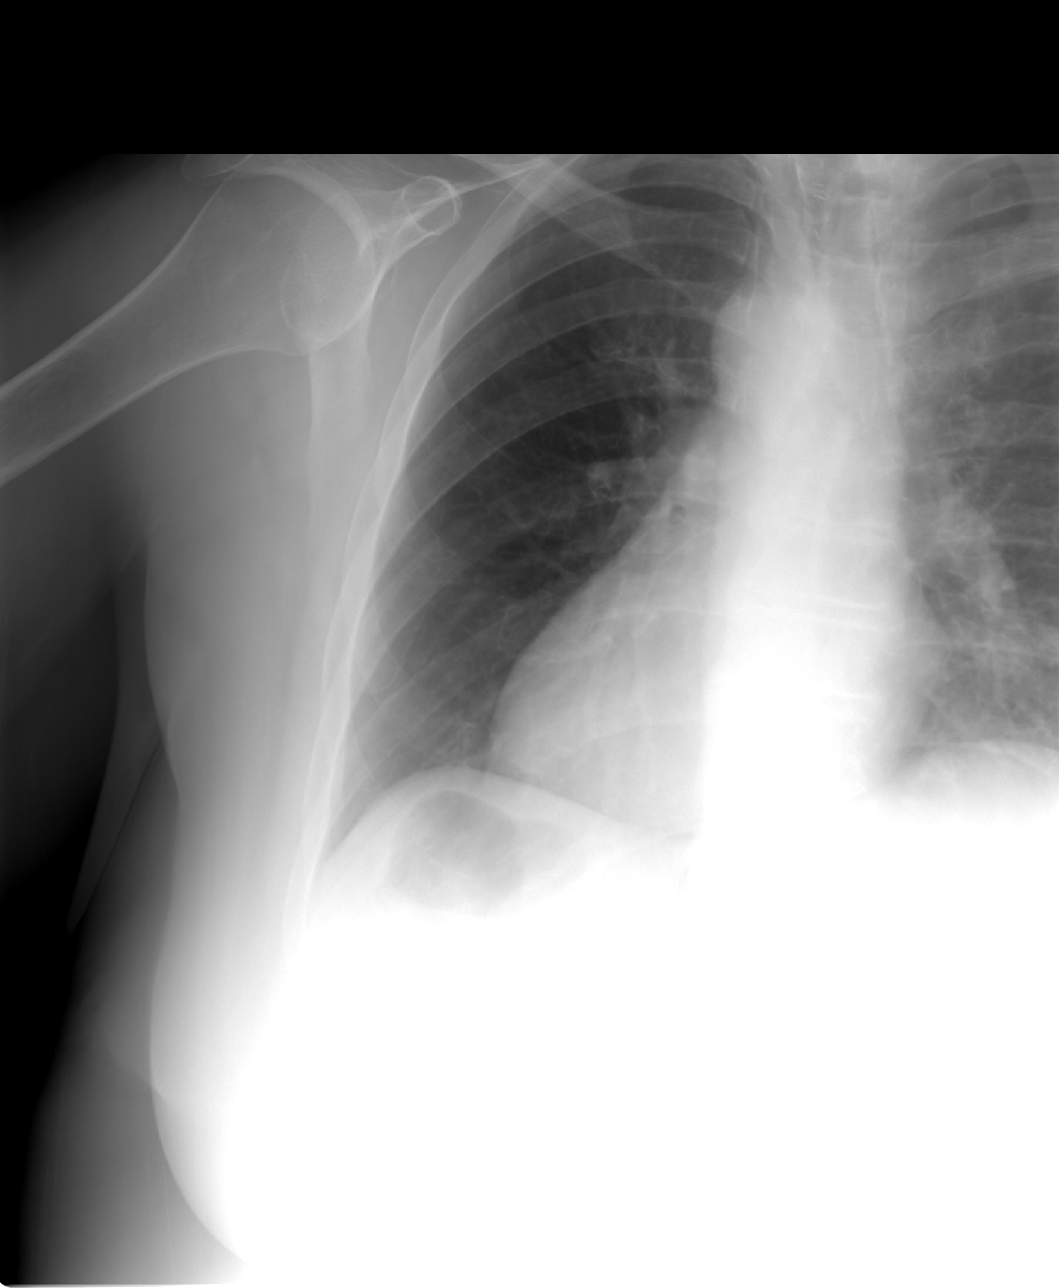

[view not recorded (2 of 4)]
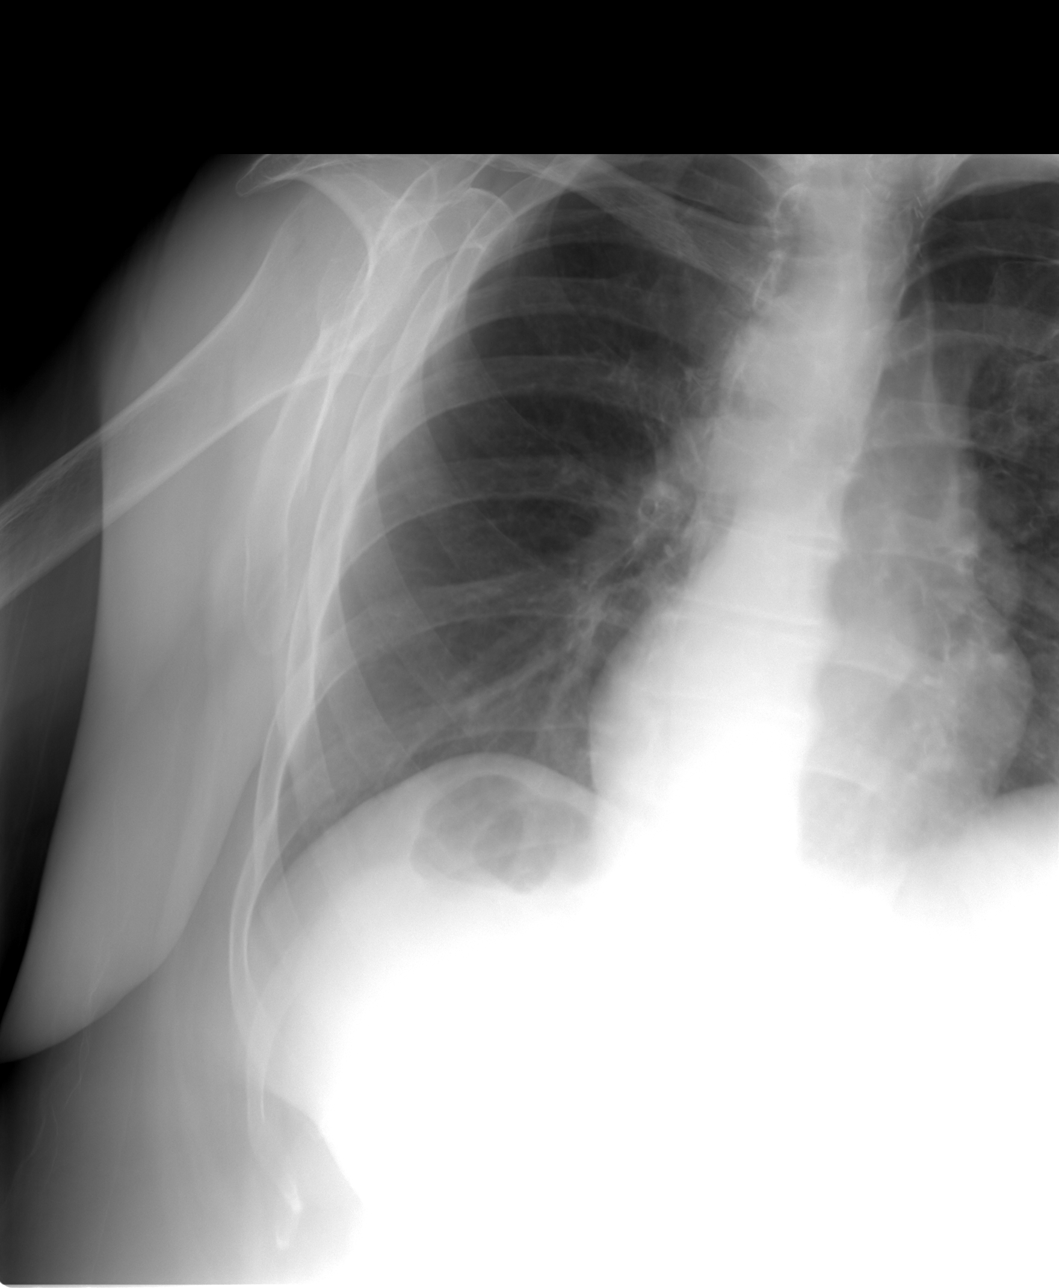

[view not recorded (3 of 4)]
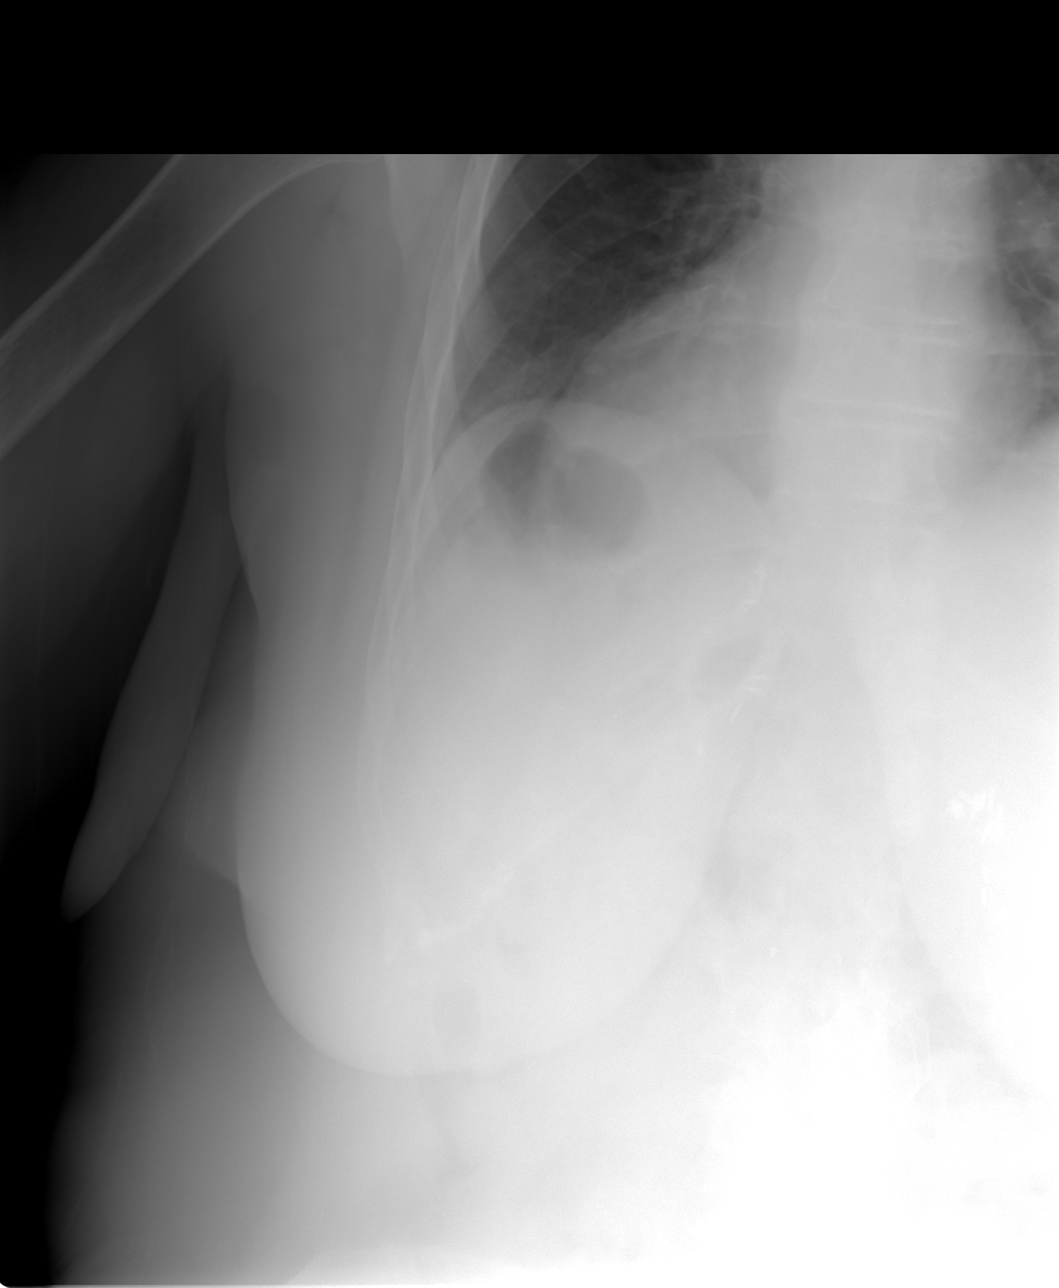

[view not recorded (4 of 4)]
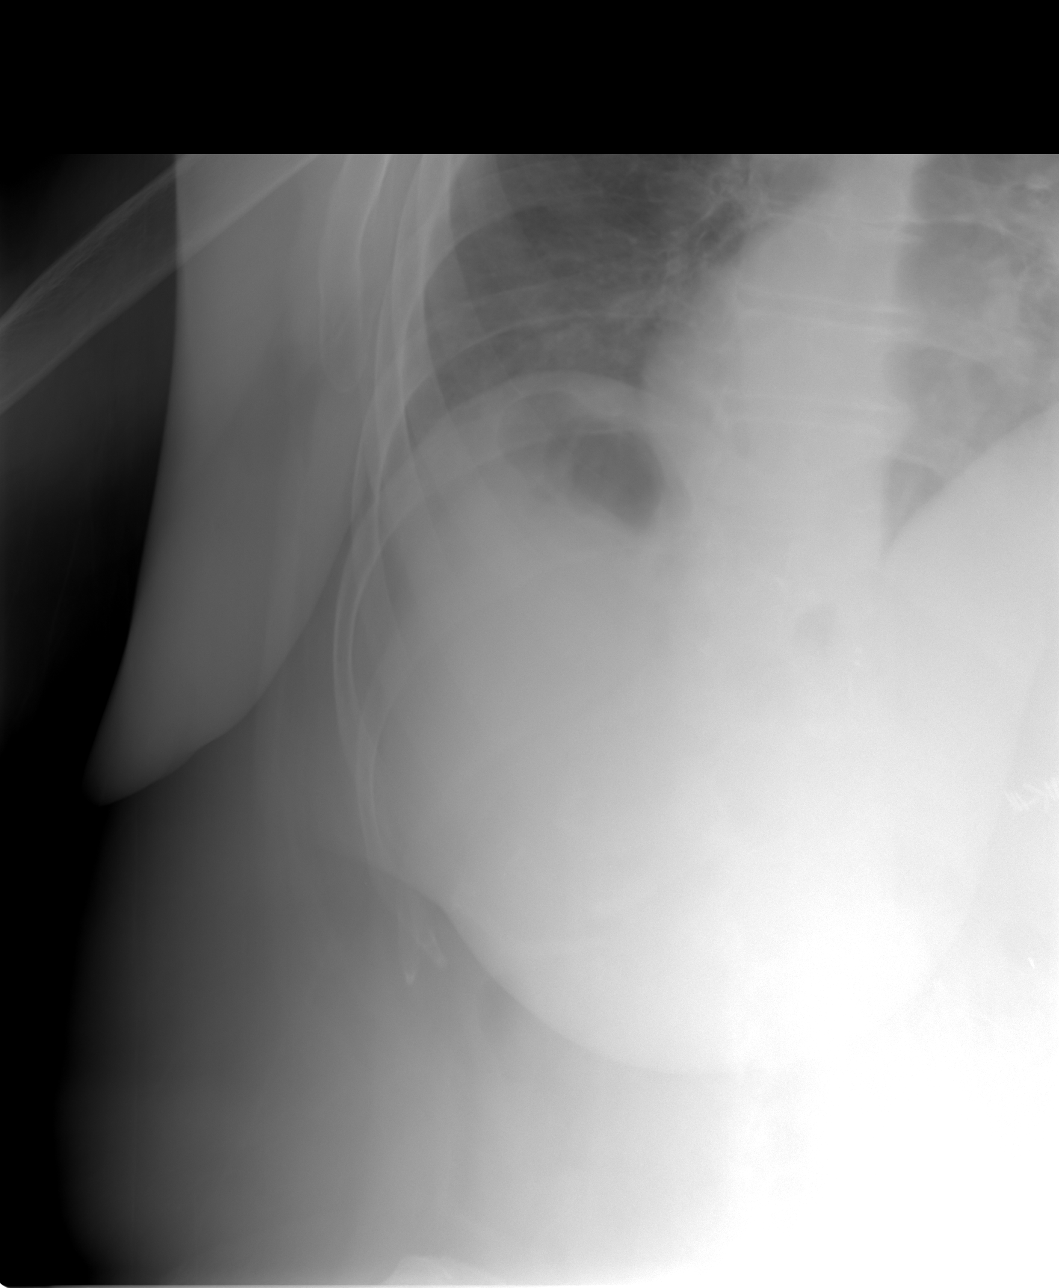

[4 of 4 positions shown; findings below may reference images not displayed]

FINDINGS: No visible rib fracture.  No pneumothorax.  No effusion
or focal airspace opacity in the left lung.
IMPRESSION: No visible rib fracture.

## 2013-10-01 ENCOUNTER — Other Ambulatory Visit: Payer: Self-pay | Admitting: Internal Medicine

## 2013-10-26 ENCOUNTER — Other Ambulatory Visit: Payer: Self-pay

## 2013-11-30 ENCOUNTER — Other Ambulatory Visit: Payer: Self-pay | Admitting: Internal Medicine

## 2013-12-07 ENCOUNTER — Other Ambulatory Visit: Payer: Self-pay | Admitting: Internal Medicine

## 2013-12-08 NOTE — Telephone Encounter (Signed)
Filled 11/30/13

## 2014-02-01 ENCOUNTER — Other Ambulatory Visit: Payer: Self-pay | Admitting: *Deleted

## 2014-02-01 ENCOUNTER — Telehealth: Payer: Self-pay | Admitting: Oncology

## 2014-02-01 DIAGNOSIS — D649 Anemia, unspecified: Secondary | ICD-10-CM

## 2014-02-01 NOTE — Telephone Encounter (Signed)
, °

## 2014-02-06 ENCOUNTER — Other Ambulatory Visit: Payer: Self-pay | Admitting: Internal Medicine

## 2014-02-06 ENCOUNTER — Ambulatory Visit: Payer: Medicare Other | Admitting: Adult Health

## 2014-02-06 ENCOUNTER — Other Ambulatory Visit: Payer: Medicare Other

## 2014-02-07 ENCOUNTER — Telehealth: Payer: Self-pay | Admitting: Oncology

## 2014-02-07 NOTE — Telephone Encounter (Signed)
returned pt call adn r/s missed appt....pt ok and aware °

## 2014-02-07 NOTE — Telephone Encounter (Signed)
Ok to fill? Last seen 11/2012

## 2014-02-07 NOTE — Telephone Encounter (Signed)
Okay #60 x 0 She needs to set up an appt soon

## 2014-02-07 NOTE — Telephone Encounter (Signed)
rx called into pharmacy

## 2014-02-08 ENCOUNTER — Other Ambulatory Visit: Payer: Self-pay | Admitting: Internal Medicine

## 2014-02-08 ENCOUNTER — Encounter: Payer: Self-pay | Admitting: Adult Health

## 2014-02-08 ENCOUNTER — Other Ambulatory Visit (HOSPITAL_BASED_OUTPATIENT_CLINIC_OR_DEPARTMENT_OTHER): Payer: Medicare Other

## 2014-02-08 ENCOUNTER — Ambulatory Visit (HOSPITAL_BASED_OUTPATIENT_CLINIC_OR_DEPARTMENT_OTHER): Payer: Medicare Other | Admitting: Adult Health

## 2014-02-08 ENCOUNTER — Telehealth: Payer: Self-pay | Admitting: Adult Health

## 2014-02-08 VITALS — BP 113/82 | HR 73 | Temp 98.3°F | Resp 18 | Ht 64.0 in | Wt 286.8 lb

## 2014-02-08 DIAGNOSIS — Z9884 Bariatric surgery status: Secondary | ICD-10-CM | POA: Diagnosis not present

## 2014-02-08 DIAGNOSIS — E538 Deficiency of other specified B group vitamins: Secondary | ICD-10-CM | POA: Diagnosis not present

## 2014-02-08 DIAGNOSIS — R5383 Other fatigue: Secondary | ICD-10-CM | POA: Diagnosis not present

## 2014-02-08 DIAGNOSIS — R5381 Other malaise: Secondary | ICD-10-CM | POA: Diagnosis not present

## 2014-02-08 DIAGNOSIS — D649 Anemia, unspecified: Secondary | ICD-10-CM | POA: Diagnosis not present

## 2014-02-08 DIAGNOSIS — E559 Vitamin D deficiency, unspecified: Secondary | ICD-10-CM | POA: Diagnosis not present

## 2014-02-08 LAB — CBC WITH DIFFERENTIAL/PLATELET
BASO%: 0.5 % (ref 0.0–2.0)
BASOS ABS: 0 10*3/uL (ref 0.0–0.1)
EOS ABS: 0.2 10*3/uL (ref 0.0–0.5)
EOS%: 2.5 % (ref 0.0–7.0)
HCT: 40.7 % (ref 34.8–46.6)
HEMOGLOBIN: 13.7 g/dL (ref 11.6–15.9)
LYMPH#: 2.1 10*3/uL (ref 0.9–3.3)
LYMPH%: 34.6 % (ref 14.0–49.7)
MCH: 31.5 pg (ref 25.1–34.0)
MCHC: 33.6 g/dL (ref 31.5–36.0)
MCV: 93.9 fL (ref 79.5–101.0)
MONO#: 0.5 10*3/uL (ref 0.1–0.9)
MONO%: 7.8 % (ref 0.0–14.0)
NEUT%: 54.6 % (ref 38.4–76.8)
NEUTROS ABS: 3.2 10*3/uL (ref 1.5–6.5)
Platelets: 210 10*3/uL (ref 145–400)
RBC: 4.34 10*6/uL (ref 3.70–5.45)
RDW: 13.3 % (ref 11.2–14.5)
WBC: 5.9 10*3/uL (ref 3.9–10.3)

## 2014-02-08 LAB — COMPREHENSIVE METABOLIC PANEL (CC13)
ALBUMIN: 3.6 g/dL (ref 3.5–5.0)
ALT: 12 U/L (ref 0–55)
AST: 15 U/L (ref 5–34)
Alkaline Phosphatase: 145 U/L (ref 40–150)
Anion Gap: 10 mEq/L (ref 3–11)
BUN: 5.4 mg/dL — AB (ref 7.0–26.0)
CALCIUM: 9.2 mg/dL (ref 8.4–10.4)
CHLORIDE: 108 meq/L (ref 98–109)
CO2: 27 meq/L (ref 22–29)
Creatinine: 0.6 mg/dL (ref 0.6–1.1)
Glucose: 99 mg/dl (ref 70–140)
POTASSIUM: 3.5 meq/L (ref 3.5–5.1)
Sodium: 145 mEq/L (ref 136–145)
Total Bilirubin: 1.26 mg/dL — ABNORMAL HIGH (ref 0.20–1.20)
Total Protein: 6.6 g/dL (ref 6.4–8.3)

## 2014-02-08 LAB — IRON AND TIBC CHCC
%SAT: 25 % (ref 21–57)
Iron: 81 ug/dL (ref 41–142)
TIBC: 325 ug/dL (ref 236–444)
UIBC: 244 ug/dL (ref 120–384)

## 2014-02-08 LAB — FERRITIN CHCC: Ferritin: 61 ng/ml (ref 9–269)

## 2014-02-08 LAB — TSH CHCC: TSH: 0.01 m[IU]/L — AB (ref 0.308–3.960)

## 2014-02-08 NOTE — Telephone Encounter (Signed)
, °

## 2014-02-09 LAB — T3, FREE: T3, Free: 3.4 pg/mL (ref 2.3–4.2)

## 2014-02-09 LAB — VITAMIN B12: Vitamin B-12: 229 pg/mL (ref 211–911)

## 2014-02-09 LAB — FOLATE: FOLATE: 12.5 ng/mL

## 2014-02-09 LAB — T4, FREE: Free T4: 1.58 ng/dL (ref 0.80–1.80)

## 2014-02-09 LAB — VITAMIN D 25 HYDROXY (VIT D DEFICIENCY, FRACTURES): Vit D, 25-Hydroxy: 12 ng/mL — ABNORMAL LOW (ref 30–89)

## 2014-02-09 NOTE — Progress Notes (Signed)
OFFICE PROGRESS NOTE  CC  [name] Simpler, MD [place] [id number]  DIAGNOSIS: Patient is a 53 year old female with h/o DVT after surgical procedures.  She has had a Greenfield filter in place since 2004 prior to her gastric bypass surgery that was performed in [place].    PRIOR THERAPY: 1.  H/o DVT after surgical procedures, patient has Greenfield filter in place since 2004.    2. H/o B12 deficiency d/t gastric bypass surgery.  3. H/o iron deficiency requiring parenteral iron  CURRENT THERAPY: Observation   INTERVAL HISTORY: [name] 53 y.o. female returns for evaluation and follow up.  She has been more fatigued lately and is requesting to have her iron levels checked because she feels like she would benefit from an infusion.  She is also more short of breath on exertion.  She says that this usually resolves with an iron infusion.  She hasn't seen [place] in f/u since 2012 and she hasn't seen primary care in 2 years.    MEDICAL HISTORY: Past Medical History  Diagnosis Date  . Anemia   . GERD (gastroesophageal reflux disease)   . Thyroid disease   . Allergy   . Anxiety   . Depression   . History of DVT (deep vein thrombosis)   . Chronic venous insufficiency   . Greenfield filter in place     ALLERGIES:  is allergic to tetracyclines & related; latex; ramelteon; and zolpidem tartrate.  MEDICATIONS:  Current Outpatient Prescriptions  Medication Sig Dispense Refill  . cyclobenzaprine (FLEXERIL) 10 MG tablet TAKE 1 TABLET BY MOUTH TWICE DAILY AS NEEDED FOR MUSCLE SPASMS  60 tablet  0  . levothyroxine (SYNTHROID, LEVOTHROID) 150 MCG tablet TAKE 1 TABLET (150 MCG TOTAL) BY MOUTH DAILY.  90 tablet  3  . Multiple Vitamins-Iron (DAILY MULTIVITAMINS/IRON PO) Take 1 tablet by mouth daily.       . cyanocobalamin (,VITAMIN B-12,) 1000 MCG/ML injection Inject 1,000 mcg into the muscle every 30 (thirty) days.      Marland Kitchen EPINEPHrine (EPIPEN JR) 0.15 MG/0.3ML  injection Inject 0.15 mg into the muscle daily as needed. For severe allergic reaction      . HYDROcodone-acetaminophen (VICODIN) 5-500 MG per tablet TAKE 1 TO 2 TAB BY MOUTH EVERY 6 HOURS AS NEEDED  60 tablet  0  . lansoprazole (PREVACID) 30 MG capsule Take 30 mg by mouth 3 (three) times daily as needed.       Marland Kitchen LORazepam (ATIVAN) 0.5 MG tablet Take 0.5 mg by mouth as needed.       . polyethylene glycol (MIRALAX / GLYCOLAX) packet Take 17 g by mouth daily as needed. For constipation      . temazepam (RESTORIL) 15 MG capsule TAKE 1 TO 2 CAPSULE AT BEDTIME  60 capsule  0   No current facility-administered medications for this visit.    SURGICAL HISTORY:  Past Surgical History  Procedure Laterality Date  . Cesarean section  1998  . Cholecystectomy  1982  . Abdominal hysterectomy    . Tubal ligation  1998  . Tonsillectomy  1965  . Vein ligation  1995/1998    right leg  . Dilation and curettage of uterus  2003  . Biopsy thyroid  01/03    suggestive of Hashimoto's  . Gastric bypass    . Thyroidectomy  2008    left  . Fetal surgery for congenital hernia    . Craniectomy suboccipital w/ cervical laminectomy /  chiari  05/2010    repair, Dr.Pool    REVIEW OF SYSTEMS:  A 10 point review of systems was conducted and is otherwise negative except for what is noted above.    PHYSICAL EXAMINATION: Blood pressure 113/82, pulse 73, temperature 98.3 F (36.8 C), temperature source Oral, resp. rate 18, height 5\' 4"  (1.626 m), weight 286 lb 12.8 oz (130.092 kg). Body mass index is 49.21 kg/(m^2). GENERAL: Patient is a well appearing female in no acute distress HEENT:  Sclerae anicteric.  Oropharynx clear and moist. No ulcerations or evidence of oropharyngeal candidiasis. Neck is supple.  NODES:  No cervical, supraclavicular, or axillary lymphadenopathy palpated.  LUNGS:  Clear to auscultation bilaterally.  No wheezes or rhonchi. HEART:  Regular rate and rhythm. No murmur appreciated. ABDOMEN:   Soft, nontender.  Positive, normoactive bowel sounds. No organomegaly palpated. MSK:  No focal spinal tenderness to palpation. Full range of motion bilaterally in the upper extremities. EXTREMITIES:  No peripheral edema.   SKIN:  Clear with no obvious rashes or skin changes. No nail dyscrasia. NEURO:  Nonfocal. Well oriented.  Appropriate affect. ECOG PERFORMANCE STATUS: 1 - Symptomatic but completely ambulatory  LABORATORY DATA: Lab Results  Component Value Date   WBC 5.9 02/08/2014   HGB 13.7 02/08/2014   HCT 40.7 02/08/2014   MCV 93.9 02/08/2014   PLT 210 02/08/2014      Chemistry      Component Value Date/Time   NA 145 02/08/2014 0848   NA 144 10/14/2012 1539   NA 136 08/20/2010 1309   K 3.5 02/08/2014 0848   K 4.1 10/14/2012 1539   K 3.9 08/20/2010 1309   CL 105 10/14/2012 1539   CL 97* 08/20/2010 1309   CO2 27 02/08/2014 0848   CO2 28 10/14/2012 1539   CO2 32 08/20/2010 1309   BUN 5.4* 02/08/2014 0848   BUN 6 10/14/2012 1539   BUN 9 08/20/2010 1309   CREATININE 0.6 02/08/2014 0848   CREATININE 0.44* 10/14/2012 1539   CREATININE 0.5 07/02/2011 1635      Component Value Date/Time   CALCIUM 9.2 02/08/2014 0848   CALCIUM 9.2 10/14/2012 1539   CALCIUM 8.7 08/20/2010 1309   CALCIUM 8.6 10/03/2007 2218   ALKPHOS 145 02/08/2014 0848   ALKPHOS 114 10/14/2012 1539   ALKPHOS 68 08/20/2010 1309   AST 15 02/08/2014 0848   AST 16 10/14/2012 1539   AST 26 08/20/2010 1309   ALT 12 02/08/2014 0848   ALT 13 10/14/2012 1539   ALT 36 08/20/2010 1309   BILITOT 1.26* 02/08/2014 0848   BILITOT 1.4* 10/14/2012 1539   BILITOT 1.50 08/20/2010 1309     RADIOGRAPHIC STUDIES:  No results found.  ASSESSMENT/PLAN:   1.  Patient is here for increased fatigue.  She hasn't had labs drawn in a couple of years.  I will add on a CBC , CMET, Vitamin D level, B12 level, and iron studies.  She is also requesting a thyroid panel be drawn.  I explained to her that it would have to be managed by her PCP.  She was  agreeable.    2.  I will call her with her lab results as they return.  She will return to see [place] in 6 months. I also recommended she return to see her PCP ASAP.     All questions were answered. The patient knows to call the clinic with any problems, questions or concerns. We can certainly see the patient much sooner if  necessary.  I spent 25 minutes counseling the patient face to face. The total time spent in the appointment was 30 minutes.  Minette Headland, [place] [phone] 02/09/2014, 5:01 PM

## 2014-02-12 NOTE — Progress Notes (Signed)
I did order these.  She requested I send them to you as well.  Just informational.  I think she has been lost to follow up with all of [place].  Thanks,  Green

## 2014-02-13 ENCOUNTER — Telehealth: Payer: Self-pay | Admitting: *Deleted

## 2014-02-13 DIAGNOSIS — C50919 Malignant neoplasm of unspecified site of unspecified female breast: Secondary | ICD-10-CM

## 2014-02-13 MED ORDER — ERGOCALCIFEROL 1.25 MG (50000 UT) PO CAPS: 50000.0000 [IU] | ORAL_CAPSULE | ORAL | Status: DC

## 2014-02-13 NOTE — Telephone Encounter (Signed)
Per NP request. Called pt to inform her of Vitamin D level.Sent Rx to pt's pharmacy for Ergocalciferol 50,000 units. She is to take 1 tab PO q week. Message to be forwarded to Charlestine Massed, NP.

## 2014-02-16 ENCOUNTER — Ambulatory Visit (INDEPENDENT_AMBULATORY_CARE_PROVIDER_SITE_OTHER): Payer: Medicare Other | Admitting: Podiatry

## 2014-02-16 ENCOUNTER — Encounter: Payer: Self-pay | Admitting: Podiatry

## 2014-02-16 ENCOUNTER — Ambulatory Visit (INDEPENDENT_AMBULATORY_CARE_PROVIDER_SITE_OTHER): Payer: Medicare Other

## 2014-02-16 VITALS — BP 130/88 | HR 63 | Resp 16 | Ht 63.0 in | Wt 289.0 lb

## 2014-02-16 DIAGNOSIS — M79671 Pain in right foot: Secondary | ICD-10-CM

## 2014-02-16 DIAGNOSIS — M2040 Other hammer toe(s) (acquired), unspecified foot: Secondary | ICD-10-CM

## 2014-02-16 DIAGNOSIS — M79609 Pain in unspecified limb: Secondary | ICD-10-CM | POA: Diagnosis not present

## 2014-02-16 DIAGNOSIS — M7660 Achilles tendinitis, unspecified leg: Secondary | ICD-10-CM

## 2014-02-16 DIAGNOSIS — M79672 Pain in left foot: Principal | ICD-10-CM

## 2014-02-16 MED ORDER — TRIAMCINOLONE ACETONIDE 10 MG/ML IJ SUSP
10.0000 mg | Freq: Once | INTRAMUSCULAR | Status: AC
  Administered 2014-02-16: 10 mg

## 2014-02-16 NOTE — Progress Notes (Signed)
   Subjective:    Patient ID: [name], female    DOB: 10-Aug-1961, 53 y.o.   MRN: [id number]  HPI Comments: Right back of heel burning pain, it is worse when driving. Its been about 4 months , also the left foot feel like is has tape on foot cant flex it up. Had emergency brain surgery in 2011 and since has had no feeling in the left leg down foot. It came back gradually . Weird feeling in the left foot   Foot Pain      Review of Systems  All other systems reviewed and are negative.       Objective:   Physical Exam        Assessment & Plan:

## 2014-02-16 NOTE — Patient Instructions (Signed)

## 2014-02-18 NOTE — Progress Notes (Signed)
Subjective:     Patient ID: [name], female   DOB: 02-08-1961, 53 y.o.   MRN: [id number]  Foot Pain   patient presents stating I'm having pain in the back of my right heel and pain in the front of both feet along with digital deformities of my second toes which do become tender. States she had brain surgery in 2011 and developed some weakness of the left extremity   Review of Systems  All other systems reviewed and are negative.       Objective:   Physical Exam  Nursing note and vitals reviewed. Constitutional: She is oriented to person, place, and time.  Cardiovascular: Intact distal pulses.   Musculoskeletal: Normal range of motion.  Neurological: She is oriented to person, place, and time.  Skin: Skin is warm.   neurovascular status is intact with muscle strength adequate right side mildly diminished on the left with no range of motion loss noted. Patient has good Fill time and is noted to have discomfort in the posterior aspect of the right heel lateral side Achilles insertion with the central and medial been healthy. No equinus condition was noted of this area and the forefoot left is vaguely tender but no specific areas of tenderness and moderate digital deformity was noted     Assessment:     Achilles tendinitis with probable forefoot instability secondary to previous brain surgery with weakness on the left side and also digital deformities    Plan:     H&P and x-rays reviewed. Today I went ahead and I did a very careful lateral injection after explaining the patient chances for rupture 3 mg dexamethasone Kenalog combination with Xylocaine and applied air fracture walker to completely immobilize. Gave instructions on reduced activity and reappoint in 2 weeks or earlier if needed

## 2014-03-06 ENCOUNTER — Telehealth: Payer: Self-pay | Admitting: *Deleted

## 2014-03-06 MED ORDER — EPINEPHRINE 0.3 MG/0.3ML IJ SOAJ: 0.3000 mg | Freq: Once | INTRAMUSCULAR | Status: DC

## 2014-03-06 NOTE — Telephone Encounter (Signed)
Patient left a voicemail that she tried to get a refill on her Epi pen from her pharmacy and they had nothing in the system on this. Patient request a refill. Pharmacy [facility]

## 2014-03-06 NOTE — Telephone Encounter (Signed)
Okay to refill #2 x 1 Use as directed for allergic reaction

## 2014-03-06 NOTE — Telephone Encounter (Signed)
rx sent to pharmacy by e-script  

## 2014-03-13 ENCOUNTER — Ambulatory Visit (INDEPENDENT_AMBULATORY_CARE_PROVIDER_SITE_OTHER): Payer: Medicare Other | Admitting: Podiatry

## 2014-03-13 ENCOUNTER — Encounter: Payer: Self-pay | Admitting: Podiatry

## 2014-03-13 VITALS — BP 121/74 | HR 76 | Resp 12

## 2014-03-13 DIAGNOSIS — M2040 Other hammer toe(s) (acquired), unspecified foot: Secondary | ICD-10-CM | POA: Diagnosis not present

## 2014-03-14 ENCOUNTER — Other Ambulatory Visit: Payer: Self-pay | Admitting: Internal Medicine

## 2014-03-14 NOTE — Progress Notes (Signed)
Subjective:     Patient ID: [name], female   DOB: 1961/01/27, 53 y.o.   MRN: [id number]  HPI patient presents stating my right foot is still a little sore but much better and like this left fifth toe lower as I cannot wear shoe gear comfortably   Review of Systems     Objective:   Physical Exam Neurovascular status intact with right foot improved from previous treatment and left fifth toe which has a tight extensor tendon and it is irritated and red on top and painful when pressed    Assessment:     Healing well right with hammertoe deformity and elevated fifth toe with contracture of the extensor tendon left fifth toe    Plan:     Reviewed condition and recommended lowering of the fifth toe. She wants this done and I explained procedure and at this time I reviewed risk of tenotomy of the fifth toe. She is willing to accept risk and at this time I infiltrated 60 mg Xylocaine Marcaine mixture and carefully at the metatarsophalangeal joint made a small stab incision with a 15 blade using sterile technique. I came under the tendon and released it allowing the fifth toe to lower and it lower to be very acceptable level. I then flushed the small wound and sutured with one 5-0 nylon and applied sterile dressing. Instructed on keeping this dressing on for one week and reappoint for [place] to take suture out in approximately 10 days and to let us know if any redness swelling or any irritation were to occur

## 2014-03-27 ENCOUNTER — Ambulatory Visit (INDEPENDENT_AMBULATORY_CARE_PROVIDER_SITE_OTHER): Payer: Medicare Other | Admitting: Podiatry

## 2014-03-27 VITALS — Resp 16 | Ht 64.0 in | Wt 280.0 lb

## 2014-03-27 DIAGNOSIS — M2040 Other hammer toe(s) (acquired), unspecified foot: Secondary | ICD-10-CM

## 2014-03-27 NOTE — Progress Notes (Signed)
Subjective:     Patient ID: [name], female   DOB: 1961/08/29, 53 y.o.   MRN: [id number]  HPI patient states my toe is doing great   Review of Systems     Objective:   Physical Exam Neurovascular status intact with fifth toe in excellent alignment and stitch intact    Assessment:     Doing well from tenotomy fifth toe left foot    Plan:     Stitch removal dressing applied and instructed on keeping it dry for 1 week. Reappoint if any issues should occur

## 2014-04-11 ENCOUNTER — Other Ambulatory Visit: Payer: Self-pay | Admitting: Internal Medicine

## 2014-05-13 ENCOUNTER — Other Ambulatory Visit: Payer: Self-pay | Admitting: Internal Medicine

## 2014-05-16 ENCOUNTER — Other Ambulatory Visit: Payer: Self-pay

## 2014-05-16 MED ORDER — CYCLOBENZAPRINE HCL 10 MG PO TABS: ORAL_TABLET | ORAL | Status: DC

## 2014-05-16 NOTE — Telephone Encounter (Signed)
Pt left v/m requesting refill cyclobenzaprine to [facility]. Pt has been out of med for 4 days. Pt already scheduled appt with Dr Silvio Pate on 05/18/14 at 7:15 am. Pt last seen 12/20/12.Please advise.

## 2014-05-16 NOTE — Telephone Encounter (Signed)
Okay #60 x 0 

## 2014-05-16 NOTE — Telephone Encounter (Signed)
rx sent to pharmacy by e-script  

## 2014-05-18 ENCOUNTER — Encounter: Payer: Self-pay | Admitting: Internal Medicine

## 2014-05-18 ENCOUNTER — Ambulatory Visit (INDEPENDENT_AMBULATORY_CARE_PROVIDER_SITE_OTHER): Payer: Medicare Other | Admitting: Internal Medicine

## 2014-05-18 VITALS — BP 110/70 | HR 66 | Temp 97.8°F | Wt 294.0 lb

## 2014-05-18 DIAGNOSIS — F411 Generalized anxiety disorder: Secondary | ICD-10-CM | POA: Diagnosis not present

## 2014-05-18 DIAGNOSIS — Z0000 Encounter for general adult medical examination without abnormal findings: Secondary | ICD-10-CM

## 2014-05-18 DIAGNOSIS — E039 Hypothyroidism, unspecified: Secondary | ICD-10-CM

## 2014-05-18 DIAGNOSIS — G479 Sleep disorder, unspecified: Secondary | ICD-10-CM

## 2014-05-18 DIAGNOSIS — E538 Deficiency of other specified B group vitamins: Secondary | ICD-10-CM

## 2014-05-18 DIAGNOSIS — I872 Venous insufficiency (chronic) (peripheral): Secondary | ICD-10-CM

## 2014-05-18 LAB — CBC WITH DIFFERENTIAL/PLATELET
Basophils Absolute: 0 10*3/uL (ref 0.0–0.1)
Basophils Relative: 0.3 % (ref 0.0–3.0)
Eosinophils Absolute: 0.1 10*3/uL (ref 0.0–0.7)
Eosinophils Relative: 2.6 % (ref 0.0–5.0)
HCT: 38.8 % (ref 36.0–46.0)
HEMOGLOBIN: 12.9 g/dL (ref 12.0–15.0)
LYMPHS PCT: 40 % (ref 12.0–46.0)
Lymphs Abs: 2.3 10*3/uL (ref 0.7–4.0)
MCHC: 33.2 g/dL (ref 30.0–36.0)
MCV: 93 fl (ref 78.0–100.0)
MONOS PCT: 7.3 % (ref 3.0–12.0)
Monocytes Absolute: 0.4 10*3/uL (ref 0.1–1.0)
NEUTROS ABS: 2.9 10*3/uL (ref 1.4–7.7)
NEUTROS PCT: 49.8 % (ref 43.0–77.0)
Platelets: 193 10*3/uL (ref 150.0–400.0)
RBC: 4.17 Mil/uL (ref 3.87–5.11)
RDW: 12.7 % (ref 11.5–15.5)
WBC: 5.8 10*3/uL (ref 4.0–10.5)

## 2014-05-18 LAB — COMPREHENSIVE METABOLIC PANEL
ALK PHOS: 120 U/L — AB (ref 39–117)
ALT: 13 U/L (ref 0–35)
AST: 17 U/L (ref 0–37)
Albumin: 3.5 g/dL (ref 3.5–5.2)
BILIRUBIN TOTAL: 1.2 mg/dL (ref 0.2–1.2)
BUN: 7 mg/dL (ref 6–23)
CO2: 29 mEq/L (ref 19–32)
Calcium: 8.8 mg/dL (ref 8.4–10.5)
Chloride: 106 mEq/L (ref 96–112)
Creatinine, Ser: 0.5 mg/dL (ref 0.4–1.2)
GFR: 125.52 mL/min (ref >60.00)
Glucose, Bld: 93 mg/dL (ref 70–99)
Potassium: 3.9 mEq/L (ref 3.5–5.1)
Sodium: 141 mEq/L (ref 135–145)
TOTAL PROTEIN: 6.5 g/dL (ref 6.0–8.3)

## 2014-05-18 LAB — TSH: TSH: 0.02 u[IU]/mL — AB (ref 0.35–4.50)

## 2014-05-18 LAB — LIPID PANEL
CHOL/HDL RATIO: 4
Cholesterol: 228 mg/dL — ABNORMAL HIGH (ref 0–200)
HDL: 53.3 mg/dL (ref >39.00)
LDL Cholesterol: 161 mg/dL — ABNORMAL HIGH (ref 0–99)
Triglycerides: 70 mg/dL (ref 0.0–149.0)
VLDL: 14 mg/dL (ref 0.0–40.0)

## 2014-05-18 LAB — T4, FREE: Free T4: 1.21 ng/dL (ref 0.60–1.60)

## 2014-05-18 MED ORDER — "SYRINGE/NEEDLE (DISP) 25G X 1"" 1 ML MISC": Status: DC

## 2014-05-18 MED ORDER — CYANOCOBALAMIN 1000 MCG/ML IJ SOLN: 1000.0000 ug | INTRAMUSCULAR | Status: DC

## 2014-05-18 MED ORDER — CYANOCOBALAMIN 1000 MCG/ML IJ SOLN
1000.0000 ug | Freq: Once | INTRAMUSCULAR | Status: AC
  Administered 2014-05-18: 1000 ug via INTRAMUSCULAR

## 2014-05-18 NOTE — Progress Notes (Signed)
Pre visit review using our clinic review tool, if applicable. No additional management support is needed unless otherwise documented below in the visit note. 

## 2014-05-18 NOTE — Progress Notes (Signed)
Subjective:    Patient ID: [name], female    DOB: [birth year], 53 y.o.   MRN: [id number]  HPI Here for physical  Has put a lot of weight back on---very upset about that Tried to get in with Dr Karna Christmas they don't take Medicare for bariatric care Discussed that I don't think there are any options after that Has had a lot of stress--financial and other--this caused noncompliance Still gets sick if she eats sweets Pasta and rice get "stuck" and she gets nausea, etc Active at school but no real exercise  No real depression Oldest son moving out Will be losing money ([employer]) when younger son reaches 64 Doesn't use the lorazepam anymore  Still uses the cyclobenzaprine at night Prevents severe cramps and she can sleep  Current Outpatient Prescriptions on File Prior to Visit  Medication Sig Dispense Refill  . cyanocobalamin (,VITAMIN B-12,) 1000 MCG/ML injection Inject 1,000 mcg into the muscle every 30 (thirty) days.      . cyclobenzaprine (FLEXERIL) 10 MG tablet TAKE 1 TABLET BY MOUTH TWICE DAILY AS NEEDED FOR MUSCLE SPASMS  60 tablet  0  . EPINEPHrine (EPI-PEN) 0.3 mg/0.3 mL SOAJ injection Inject 0.3 mLs (0.3 mg total) into the muscle once.  2 Device  1  . ergocalciferol (VITAMIN D2) 50000 UNITS capsule Take 1 capsule (50,000 Units total) by mouth once a week.  4 capsule  3  . levothyroxine (SYNTHROID, LEVOTHROID) 150 MCG tablet TAKE 1 TABLET (150 MCG TOTAL) BY MOUTH DAILY.  90 tablet  3  . Multiple Vitamins-Iron (DAILY MULTIVITAMINS/IRON PO) Take 1 tablet by mouth daily.       . polyethylene glycol (MIRALAX / GLYCOLAX) packet Take 17 g by mouth daily as needed. For constipation       No current facility-administered medications on file prior to visit.    Allergies  Allergen Reactions  . Tetracyclines & Related Anaphylaxis    syncope  . Latex     REACTION: "anaphylactic shock"  . Ramelteon     REACTION: blacks out  . Zolpidem Tartrate     REACTION: black outs     Past Medical History  Diagnosis Date  . Anemia   . GERD (gastroesophageal reflux disease)   . Thyroid disease   . Allergy   . Anxiety   . Depression   . History of DVT (deep vein thrombosis)   . Chronic venous insufficiency   . Greenfield filter in place     Past Surgical History  Procedure Laterality Date  . Cesarean section  1998  . Cholecystectomy  1982  . Abdominal hysterectomy    . Tubal ligation  1998  . Tonsillectomy  1965  . Vein ligation  1995/1998    right leg  . Dilation and curettage of uterus  2003  . Biopsy thyroid  01/03    suggestive of Hashimoto's  . Gastric bypass    . Thyroidectomy  2008    left  . Fetal surgery for congenital hernia    . Craniectomy suboccipital w/ cervical laminectomy / chiari  05/2010    repair, Dr.Pool    Family History  Problem Relation Age of Onset  . Arthritis Mother   . Coronary artery disease Father   . Coronary artery disease Sister   . Cancer Maternal Grandmother     Ovarian     Review of Systems  Constitutional: Positive for unexpected weight change. Negative for fatigue.       Wears seat belt  HENT: Positive for dental problem. Negative for hearing loss and tinnitus.        Full dentures but can't wear them  Eyes: Negative for visual disturbance.       No diplopia or unilateral vision  Respiratory: Negative for cough, chest tightness and shortness of breath.   Cardiovascular: Positive for leg swelling. Negative for chest pain and palpitations.  Gastrointestinal: Negative for nausea, vomiting, abdominal pain, constipation and blood in stool.       Occ middle of night heartburn-- rarely takes some tums Chronic bowel issues---constipation if misses window of opportunity  Endocrine: Negative for cold intolerance and heat intolerance.  Genitourinary: Negative for dysuria, hematuria and difficulty urinating.  Musculoskeletal: Positive for back pain. Negative for arthralgias and joint swelling.  Skin: Negative  for rash.       Some darkening of forearms by wrists  Allergic/Immunologic: Positive for environmental allergies. Negative for immunocompromised state.       Usually fall allergies--uses OTC  Neurological: Positive for numbness and headaches. Negative for dizziness, syncope, weakness and light-headedness.       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 Does not bruise/bleed easily.  Psychiatric/Behavioral: Positive for sleep disturbance. Negative for dysphoric mood. The patient is nervous/anxious.        Objective:   Physical Exam  Constitutional: She is oriented to person, place, and time. She appears well-developed. No distress.  HENT:  Head: Normocephalic and atraumatic.  Right Ear: External ear normal.  Left Ear: External ear normal.  Mouth/Throat: Oropharynx is clear and moist. No oropharyngeal exudate.  Eyes: Conjunctivae and EOM are normal. Pupils are equal, round, and reactive to light.  Neck: Normal range of motion. Neck supple. No thyromegaly present.  Cardiovascular: Normal rate, regular rhythm, normal heart sounds and intact distal pulses.  Exam reveals no gallop.   No murmur heard. Pulmonary/Chest: Effort normal and breath sounds normal. No respiratory distress. She has no wheezes. She has no rales.  Abdominal: Soft. There is no tenderness.  Genitourinary:  Moderate cysts in both breasts No tenderness or worrisome masses  Musculoskeletal: She exhibits edema. She exhibits no tenderness.  2-3+ edema bilaterally Marked superficial varicosities but no tenderness  Lymphadenopathy:    She has no cervical adenopathy.    She has no axillary adenopathy.  Neurological: She is alert and oriented to person, place, and time.  Skin: No rash noted. No erythema.  Psychiatric: She has a normal mood and affect. Her behavior is normal.          Assessment & Plan:

## 2014-05-18 NOTE — Addendum Note (Signed)
Addended by: Despina Hidden on: 05/18/2014 08:06 AM   Modules accepted: Orders

## 2014-05-18 NOTE — Assessment & Plan Note (Signed)
Still situational Hasn't needed meds

## 2014-05-18 NOTE — Assessment & Plan Note (Signed)
Does okay with the cyclobenzaprine

## 2014-05-18 NOTE — Patient Instructions (Signed)
Please set up your screening mammogram.  Exercise to Lose Weight Exercise and a healthy diet may help you lose weight. Your doctor may suggest specific exercises. EXERCISE IDEAS AND TIPS  Choose low-cost things you enjoy doing, such as walking, bicycling, or exercising to workout videos.  Take stairs instead of the elevator.  Walk during your lunch break.  Park your car further away from work or school.  Go to a gym or an exercise class.  Start with 5 to 10 minutes of exercise each day. Build up to 30 minutes of exercise 4 to 6 days a week.  Wear shoes with good support and comfortable clothes.  Stretch before and after working out.  Work out until you breathe harder and your heart beats faster.  Drink extra water when you exercise.  Do not do so much that you hurt yourself, feel dizzy, or get very short of breath. Exercises that burn about 150 calories:  Running 1  miles in 15 minutes.  Playing volleyball for 45 to 60 minutes.  Washing and waxing a car for 45 to 60 minutes.  Playing touch football for 45 minutes.  Walking 1  miles in 35 minutes.  Pushing a stroller 1  miles in 30 minutes.  Playing basketball for 30 minutes.  Raking leaves for 30 minutes.  Bicycling 5 miles in 30 minutes.  Walking 2 miles in 30 minutes.  Dancing for 30 minutes.  Shoveling snow for 15 minutes.  Swimming laps for 20 minutes.  Walking up stairs for 15 minutes.  Bicycling 4 miles in 15 minutes.  Gardening for 30 to 45 minutes.  Jumping rope for 15 minutes.  Washing windows or floors for 45 to 60 minutes. Document Released: 01/09/2011 Document Revised: 02/29/2012 Document Reviewed: 01/09/2011 ExitCare Patient Information 2014 [employer] DASH Diet The DASH diet stands for "Dietary Approaches to Stop Hypertension." It is a healthy eating plan that has been shown to reduce high blood pressure (hypertension) in as little as 14 days, while also possibly providing  other significant health benefits. These other health benefits include reducing the risk of breast cancer after menopause and reducing the risk of type 2 diabetes, heart disease, colon cancer, and stroke. Health benefits also include weight loss and slowing kidney failure in patients with chronic kidney disease.  DIET GUIDELINES  Limit salt (sodium). Your diet should contain less than 1500 mg of sodium daily.  Limit refined or processed carbohydrates. Your diet should include mostly whole grains. Desserts and added sugars should be used sparingly.  Include small amounts of heart-healthy fats. These types of fats include nuts, oils, and tub margarine. Limit saturated and trans fats. These fats have been shown to be harmful in the body. CHOOSING FOODS  The following food groups are based on a 2000 calorie diet. See your Registered Dietitian for individual calorie needs. Grains and Grain Products (6 to 8 servings daily)  Eat More Often: Whole-wheat bread, brown rice, whole-grain or wheat pasta, quinoa, popcorn without added fat or salt (air popped).  Eat Less Often: White bread, white pasta, white rice, cornbread. Vegetables (4 to 5 servings daily)  Eat More Often: Fresh, frozen, and canned vegetables. Vegetables may be raw, steamed, roasted, or grilled with a minimal amount of fat.  Eat Less Often/Avoid: Creamed or fried vegetables. Vegetables in a cheese sauce. Fruit (4 to 5 servings daily)  Eat More Often: All fresh, canned (in natural juice), or frozen fruits. Dried fruits without added sugar. One hundred percent fruit   juice ( cup [237 mL] daily).  Eat Less Often: Dried fruits with added sugar. Canned fruit in light or heavy syrup. Lean Meats, Fish, and Poultry (2 servings or less daily. One serving is 3 to 4 oz [85-114 g]).  Eat More Often: Ninety percent or leaner ground beef, tenderloin, sirloin. Round cuts of beef, chicken breast, turkey breast. All fish. Grill, bake, or broil your  meat. Nothing should be fried.  Eat Less Often/Avoid: Fatty cuts of meat, turkey, or chicken leg, thigh, or wing. Fried cuts of meat or fish. Dairy (2 to 3 servings)  Eat More Often: Low-fat or fat-free milk, low-fat plain or light yogurt, reduced-fat or part-skim cheese.  Eat Less Often/Avoid: Milk (whole, 2%).Whole milk yogurt. Full-fat cheeses. Nuts, Seeds, and Legumes (4 to 5 servings per week)  Eat More Often: All without added salt.  Eat Less Often/Avoid: Salted nuts and seeds, canned beans with added salt. Fats and Sweets (limited)  Eat More Often: Vegetable oils, tub margarines without trans fats, sugar-free gelatin. Mayonnaise and salad dressings.  Eat Less Often/Avoid: Coconut oils, palm oils, butter, stick margarine, cream, half and half, cookies, candy, pie. FOR MORE INFORMATION The Dash Diet Eating Plan: www.dashdiet.org Document Released: 11/26/2011 Document Revised: 02/29/2012 Document Reviewed: 11/26/2011 ExitCare Patient Information 2014 [employer].  

## 2014-05-18 NOTE — Assessment & Plan Note (Signed)
Seems euthyroid ?Will check labs ?

## 2014-05-18 NOTE — Assessment & Plan Note (Signed)
No evidence of recurrent DVT

## 2014-05-18 NOTE — Assessment & Plan Note (Signed)
Needs to get back to her basics

## 2014-05-18 NOTE — Assessment & Plan Note (Signed)
Healthy but really needs to work on fitness Due for colon 2016 She will set up mammogram

## 2014-05-28 ENCOUNTER — Telehealth: Payer: Self-pay | Admitting: Internal Medicine

## 2014-05-28 ENCOUNTER — Ambulatory Visit (INDEPENDENT_AMBULATORY_CARE_PROVIDER_SITE_OTHER): Payer: Medicare Other | Admitting: Internal Medicine

## 2014-05-28 ENCOUNTER — Encounter: Payer: Self-pay | Admitting: Internal Medicine

## 2014-05-28 VITALS — BP 120/80 | HR 86 | Temp 98.2°F | Wt 296.0 lb

## 2014-05-28 DIAGNOSIS — R51 Headache: Secondary | ICD-10-CM | POA: Diagnosis not present

## 2014-05-28 DIAGNOSIS — R519 Headache, unspecified: Secondary | ICD-10-CM | POA: Insufficient documentation

## 2014-05-28 NOTE — Progress Notes (Signed)
Pre visit review using our clinic review tool, if applicable. No additional management support is needed unless otherwise documented below in the visit note. 

## 2014-05-28 NOTE — Telephone Encounter (Signed)
Patient Information:  Caller Name: Alycia  Phone: [phone]  Patient: [name]  Gender: Female  DOB: [birth year]  Age: 53 Years  PCP: [name] [facility])  Pregnant: No  Office Follow Up:  Does the office need to follow up with this patient?: Yes  Instructions For The Office: Hx of [place] Spotted Fever and has sx again. 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 Please call if she needs to come in sooner.    Symptoms  Reason For Call & Symptoms: Woke up with large brown tick on R side, was able to remove all of the tick and she has saved it-05/28/14. Started with nonraised itchy widespread itching on -05/27/14 and has red rash on arms and under breasts. Using [facility] Benedryl Gel and has helped. She started feeling very tired and has dull headache onset yesterday. [place] and was out in yard with dog yesterday.  Reviewed Health History In EMR: Yes  Reviewed Medications In EMR: Yes  Reviewed Allergies In EMR: Yes  Reviewed Surgeries / Procedures: Yes  Date of Onset of Symptoms: 05/27/2014  Treatments Tried: Benedryl Gel  Treatments Tried Worked: No OB / GYN:  LMP: Unknown  Guideline(s) Used:  Tick Bite  Disposition Per Guideline:   Go to [facility]  Reason For Disposition Reached:   Widespread rash occurs, 2 to 14 days following the bite  Advice Given:  Wood Tick Removal:  Use a pair of tweezers and grasp the wood tick close to the skin (on its head). Pull the wood tick straight upward without twisting or crushing it. Maintain a steady pressure until it releases its grip.  If tweezers aren't available, use fingers, a loop of thread around the jaws, or a needle between the jaws for traction.  Note: covering the tick with petroleum jelly, nail polish or rubbing alcohol doesn't work. Neither does touching the tick with a hot or cold object.  Antibiotic Ointment:  Wash the wound and  your hands with soap and water after removal to prevent catching any tick disease. Apply an over-the-counter antibiotic ointment (e.g., bacitracin) to the bite once.  Expected Course:  Tick bites normally do not itch or hurt. That is why they often go unnoticed.  Call Back If:  You can't remove the tick or the tick's head  Fever or rash occur in the next 2 weeks  Bite begins to look infected  You become worse.  Patient Will Follow Care Advice:  YES  Appointment Scheduled:  05/28/2014 16:30:00 Appointment Scheduled Provider:  [name] [facility])

## 2014-05-28 NOTE — Telephone Encounter (Signed)
I will assess her at the visit

## 2014-05-28 NOTE — Progress Notes (Signed)
Subjective:    Patient ID: [name], female    DOB: 29-Jun-1961, 53 y.o.   MRN: [id number]  HPI Took a tick off her this AM Shows it to me in bottle---large (looks like dog tick) Was embedded Not sure how long ago he might have gotten him (?from yard)  Started with headache and not feeling well 3 days ago Stiff neck also Feels achy in other joints as well  No fever Some chills but no sweats  Rash inbetween breasts Very itchy yesterday  Current Outpatient Prescriptions on File Prior to Visit  Medication Sig Dispense Refill  . cyanocobalamin (,VITAMIN B-12,) 1000 MCG/ML injection Inject 1 mL (1,000 mcg total) into the muscle every 30 (thirty) days.  10 mL  1  . cyclobenzaprine (FLEXERIL) 10 MG tablet TAKE 1 TABLET BY MOUTH TWICE DAILY AS NEEDED FOR MUSCLE SPASMS  60 tablet  0  . EPINEPHrine (EPI-PEN) 0.3 mg/0.3 mL SOAJ injection Inject 0.3 mLs (0.3 mg total) into the muscle once.  2 Device  1  . ergocalciferol (VITAMIN D2) 50000 UNITS capsule Take 1 capsule (50,000 Units total) by mouth once a week.  4 capsule  3  . levothyroxine (SYNTHROID, LEVOTHROID) 150 MCG tablet TAKE 1 TABLET (150 MCG TOTAL) BY MOUTH DAILY.  90 tablet  3  . Multiple Vitamins-Iron (DAILY MULTIVITAMINS/IRON PO) Take 1 tablet by mouth daily.       . polyethylene glycol (MIRALAX / GLYCOLAX) packet Take 17 g by mouth daily as needed. For constipation      . Syringe/Needle, Disp, 25G X 1" 1 ML MISC Use to inject vit b-12 once monthly dx:266.2  25 each  0   No current facility-administered medications on file prior to visit.    Allergies  Allergen Reactions  . Tetracyclines & Related Anaphylaxis    syncope  . Latex     REACTION: "anaphylactic shock"  . Ramelteon     REACTION: blacks out  . Zolpidem Tartrate     REACTION: black outs    Past Medical History  Diagnosis Date  . Anemia   . GERD (gastroesophageal reflux disease)   . Thyroid disease   . Allergy   . Anxiety   . Depression   .  History of DVT (deep vein thrombosis)   . Chronic venous insufficiency   . Greenfield filter in place     Past Surgical History  Procedure Laterality Date  . Cesarean section  1998  . Cholecystectomy  1982  . Abdominal hysterectomy    . Tubal ligation  1998  . Tonsillectomy  1965  . Vein ligation  1995/1998    right leg  . Dilation and curettage of uterus  2003  . Biopsy thyroid  01/03    suggestive of Hashimoto's  . Gastric bypass    . Thyroidectomy  2008    left  . Fetal surgery for congenital hernia    . Craniectomy suboccipital w/ cervical laminectomy / chiari  05/2010    repair, Dr.Pool    Family History  Problem Relation Age of Onset  . Arthritis Mother   . Coronary artery disease Father   . Coronary artery disease Sister   . Cancer Maternal Grandmother     Ovarian    History   Social History  . Marital Status: Divorced    Spouse Name: N/A    Number of Children: 3  . Years of Education: N/A   Occupational History  . Part time Environmental manager  Social History Main Topics  . Smoking status: Never Smoker   . Smokeless tobacco: Never Used  . Alcohol Use: Yes     Comment: rare  . Drug Use: No  . Sexual Activity: Not on file   Other Topics Concern  . Not on file   Social History Narrative               Review of Systems No vomiting or diarrhea Appetite is off    Objective:   Physical Exam  Neck: Normal range of motion. Neck supple.  Normal movement of neck  Neurological: No cranial nerve deficit. Coordination normal.  Skin: No rash noted.  Tick bite site in right low back is not inflamed No other rash          Assessment & Plan:

## 2014-05-28 NOTE — Assessment & Plan Note (Signed)
Headache for 3 days No fever though doesn't feel good Tick just removed yesterday No rash or other symptoms to suggest RMSF Tick is here--was embedded but not engorged apparently and is clearly a dog tick  Discussed that I don't think she has RMSF  She did get doxy in past with steroids and benedryl and tolerated If she develops fever, I will try this agin

## 2014-06-09 ENCOUNTER — Other Ambulatory Visit: Payer: Self-pay | Admitting: Internal Medicine

## 2014-06-16 ENCOUNTER — Other Ambulatory Visit: Payer: Self-pay | Admitting: Internal Medicine

## 2014-06-18 NOTE — Telephone Encounter (Signed)
Pt left v/m requesting refill cyclobenzaprine with instructions take 2 tabs at night for muscle spasms to [facility]. Pt request cb. Pt takes cyclobenzaprine 2 tabs every night.

## 2014-07-17 ENCOUNTER — Other Ambulatory Visit: Payer: Self-pay | Admitting: Internal Medicine

## 2014-07-31 ENCOUNTER — Encounter: Payer: Self-pay | Admitting: Endocrinology

## 2014-07-31 ENCOUNTER — Ambulatory Visit (INDEPENDENT_AMBULATORY_CARE_PROVIDER_SITE_OTHER): Payer: Medicare Other | Admitting: Endocrinology

## 2014-07-31 VITALS — BP 124/78 | HR 80 | Temp 98.4°F | Ht 64.0 in | Wt 299.0 lb

## 2014-07-31 DIAGNOSIS — E041 Nontoxic single thyroid nodule: Secondary | ICD-10-CM | POA: Diagnosis not present

## 2014-07-31 MED ORDER — LEVOTHYROXINE SODIUM 125 MCG PO TABS: 125.0000 ug | ORAL_TABLET | Freq: Every day | ORAL | Status: DC

## 2014-07-31 NOTE — Patient Instructions (Addendum)
Please reduce the thyroid pill.  i have sent a prescription to your pharmacy.   Please repeat the blood test in 1 month, at [name].   Let's check the ultrasound.  you will receive a phone call, about a day and time for an appointment.   I would be happy to see you back here whenever you want.

## 2014-07-31 NOTE — Progress Notes (Signed)
Subjective:    Patient ID: [name], female    DOB: February 02, 1961, 53 y.o.   MRN: [id number]  HPI In 2008, pt had diagnostic left thyroid lobect for a suspicious FNA; pathol showed chronic thyroiditis.  She has been on synthroid since then.  She now has a sensation of fullness at the right anterior neck, and assoc hair loss Past Medical History  Diagnosis Date  . Anemia   . GERD (gastroesophageal reflux disease)   . Thyroid disease   . Allergy   . Anxiety   . Depression   . History of DVT (deep vein thrombosis)   . Chronic venous insufficiency   . Greenfield filter in place     Past Surgical History  Procedure Laterality Date  . Cesarean section  1998  . Cholecystectomy  1982  . Abdominal hysterectomy    . Tubal ligation  1998  . Tonsillectomy  1965  . Vein ligation  1995/1998    right leg  . Dilation and curettage of uterus  2003  . Biopsy thyroid  01/03    suggestive of Hashimoto's  . Gastric bypass    . Thyroidectomy  2008    left  . Fetal surgery for congenital hernia    . Craniectomy suboccipital w/ cervical laminectomy / chiari  05/2010    repair, Dr.Pool    History   Social History  . Marital Status: Divorced    Spouse Name: N/A    Number of Children: 3  . Years of Education: N/A   Occupational History  . Part time Environmental manager    Social History Main Topics  . Smoking status: Never Smoker   . Smokeless tobacco: Never Used  . Alcohol Use: Yes     Comment: rare  . Drug Use: No  . Sexual Activity: Not on file   Other Topics Concern  . Not on file   Social History Narrative                Current Outpatient Prescriptions on File Prior to Visit  Medication Sig Dispense Refill  . cyanocobalamin (,VITAMIN B-12,) 1000 MCG/ML injection Inject 1 mL (1,000 mcg total) into the muscle every 30 (thirty) days.  10 mL  1  . cyclobenzaprine (FLEXERIL) 10 MG tablet TAKE 1 TABLET BY MOUTH TWICE DAILY AS NEEDED FOR MUSCLE SPASMS  60 tablet  0    . EPINEPHrine (EPI-PEN) 0.3 mg/0.3 mL SOAJ injection Inject 0.3 mLs (0.3 mg total) into the muscle once.  2 Device  1  . ergocalciferol (VITAMIN D2) 50000 UNITS capsule Take 1 capsule (50,000 Units total) by mouth once a week.  4 capsule  3  . Multiple Vitamins-Iron (DAILY MULTIVITAMINS/IRON PO) Take 1 tablet by mouth daily.       . polyethylene glycol (MIRALAX / GLYCOLAX) packet Take 17 g by mouth daily as needed. For constipation      . Syringe/Needle, Disp, 25G X 1" 1 ML MISC Use to inject vit b-12 once monthly dx:266.2  25 each  0   No current facility-administered medications on file prior to visit.    Allergies  Allergen Reactions  . Tetracyclines & Related Anaphylaxis    syncope  . Latex     REACTION: "anaphylactic shock"  . Ramelteon     REACTION: blacks out  . Zolpidem Tartrate     REACTION: black outs    Family History  Problem Relation Age of Onset  . Arthritis Mother   . Coronary  artery disease Father   . Coronary artery disease Sister   . Cancer Maternal Grandmother     Ovarian  . Thyroid disease Neg Hx     BP 124/78  Pulse 80  Temp(Src) 98.4 F (36.9 C)  Ht 5\' 4"  (1.626 m)  Wt 299 lb (135.626 kg)  BMI 51.30 kg/m2    Review of Systems denies headache, hoarseness, double vision, palpitations, sob, diarrhea,  myalgias, excessive diaphoresis, numbness, anxiety, easy bruising, and rhinorrhea.  She has weight gain, tremor, urinary frequency, and fatigue.  She has no more menses.    Objective:   Physical Exam VS: see vs page GEN: no distress.  Morbid obesity HEAD: head: no deformity eyes: no periorbital swelling, no proptosis external nose and ears are normal mouth: no lesion seen NECK: a healed scar is present.  i do not appreciate a nodule in the thyroid or elsewhere in the neck CHEST WALL: no deformity LUNGS:  Clear to auscultation CV: reg rate and rhythm, no murmur ABD: abdomen is soft, nontender.  no hepatosplenomegaly.  not distended.  no  hernia MUSCULOSKELETAL: muscle bulk and strength are grossly normal.  no obvious joint swelling.  gait is normal and steady EXTEMITIES: no deformity.  no ulcer on the feet.  feet are of normal color and temp.  no edema.  Mod bilat varicosities PULSES: dorsalis pedis intact bilat.  no carotid bruit NEURO:  cn 2-12 grossly intact.   readily moves all 4's.  sensation is intact to touch on the feet.  No tremor. SKIN:  Normal texture and temperature.  No rash or suspicious lesion is visible.   NODES:  None palpable at the neck PSYCH: alert, well-oriented.  Does not appear anxious nor depressed.    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     Patient is advised the following: Patient Instructions  Please reduce the thyroid pill.  i have sent a prescription to your pharmacy.   Please repeat the blood test in 1 month, at [place].   Let's check the ultrasound.  you will receive a phone call, about a day and time for an appointment.   I would be happy to see you back here whenever you want.

## 2014-08-07 ENCOUNTER — Telehealth: Payer: Self-pay | Admitting: Adult Health

## 2014-08-07 ENCOUNTER — Ambulatory Visit
Admission: RE | Admit: 2014-08-07 | Discharge: 2014-08-07 | Disposition: A | Discharge status: Home / Self Care | Payer: Medicare Other | Source: Ambulatory Visit | Attending: Endocrinology | Admitting: Endocrinology

## 2014-08-07 DIAGNOSIS — E041 Nontoxic single thyroid nodule: Secondary | ICD-10-CM

## 2014-08-07 DIAGNOSIS — E065 Other chronic thyroiditis: Secondary | ICD-10-CM | POA: Diagnosis not present

## 2014-08-07 NOTE — Telephone Encounter (Signed)
, °

## 2014-08-08 ENCOUNTER — Ambulatory Visit: Payer: Medicare Other | Admitting: Adult Health

## 2014-08-08 ENCOUNTER — Other Ambulatory Visit: Payer: Medicare Other

## 2014-08-15 ENCOUNTER — Other Ambulatory Visit: Payer: Self-pay | Admitting: Internal Medicine

## 2014-08-16 ENCOUNTER — Other Ambulatory Visit: Payer: Self-pay | Admitting: Adult Health

## 2014-08-16 ENCOUNTER — Other Ambulatory Visit: Payer: Self-pay | Admitting: *Deleted

## 2014-08-16 DIAGNOSIS — E039 Hypothyroidism, unspecified: Secondary | ICD-10-CM

## 2014-08-16 DIAGNOSIS — D126 Benign neoplasm of colon, unspecified: Secondary | ICD-10-CM

## 2014-08-16 DIAGNOSIS — E559 Vitamin D deficiency, unspecified: Secondary | ICD-10-CM

## 2014-08-16 DIAGNOSIS — E538 Deficiency of other specified B group vitamins: Secondary | ICD-10-CM

## 2014-08-17 ENCOUNTER — Other Ambulatory Visit: Payer: Medicare Other

## 2014-08-17 ENCOUNTER — Telehealth: Payer: Self-pay | Admitting: Adult Health

## 2014-08-17 ENCOUNTER — Ambulatory Visit: Payer: Medicare Other | Admitting: Adult Health

## 2014-08-17 NOTE — Telephone Encounter (Signed)
, °

## 2014-08-21 ENCOUNTER — Telehealth: Payer: Self-pay | Admitting: Adult Health

## 2014-08-21 ENCOUNTER — Other Ambulatory Visit: Payer: Medicare Other

## 2014-08-21 ENCOUNTER — Ambulatory Visit: Payer: Medicare Other | Admitting: Adult Health

## 2014-08-21 NOTE — Telephone Encounter (Signed)
pt called to r/s appt due to Son going to Dr....done...pt ok and aware of new d.t

## 2014-08-24 ENCOUNTER — Ambulatory Visit (HOSPITAL_BASED_OUTPATIENT_CLINIC_OR_DEPARTMENT_OTHER): Payer: Medicare Other | Admitting: Adult Health

## 2014-08-24 ENCOUNTER — Encounter: Payer: Self-pay | Admitting: Adult Health

## 2014-08-24 ENCOUNTER — Other Ambulatory Visit (HOSPITAL_BASED_OUTPATIENT_CLINIC_OR_DEPARTMENT_OTHER): Payer: Medicare Other

## 2014-08-24 VITALS — BP 110/70 | HR 67 | Temp 98.4°F | Resp 18 | Ht 64.0 in | Wt 297.0 lb

## 2014-08-24 DIAGNOSIS — Z8639 Personal history of other endocrine, nutritional and metabolic disease: Secondary | ICD-10-CM

## 2014-08-24 DIAGNOSIS — E559 Vitamin D deficiency, unspecified: Secondary | ICD-10-CM

## 2014-08-24 DIAGNOSIS — E538 Deficiency of other specified B group vitamins: Secondary | ICD-10-CM

## 2014-08-24 DIAGNOSIS — Z862 Personal history of diseases of the blood and blood-forming organs and certain disorders involving the immune mechanism: Secondary | ICD-10-CM

## 2014-08-24 DIAGNOSIS — D126 Benign neoplasm of colon, unspecified: Secondary | ICD-10-CM

## 2014-08-24 LAB — COMPREHENSIVE METABOLIC PANEL (CC13)
ALK PHOS: 140 U/L (ref 40–150)
ALT: 12 U/L (ref 0–55)
AST: 14 U/L (ref 5–34)
Albumin: 3.4 g/dL — ABNORMAL LOW (ref 3.5–5.0)
Anion Gap: 8 mEq/L (ref 3–11)
BUN: 5.8 mg/dL — ABNORMAL LOW (ref 7.0–26.0)
CALCIUM: 8.5 mg/dL (ref 8.4–10.4)
CHLORIDE: 107 meq/L (ref 98–109)
CO2: 27 mEq/L (ref 22–29)
CREATININE: 0.6 mg/dL (ref 0.6–1.1)
GLUCOSE: 73 mg/dL (ref 70–140)
POTASSIUM: 3.7 meq/L (ref 3.5–5.1)
Sodium: 142 mEq/L (ref 136–145)
Total Bilirubin: 0.93 mg/dL (ref 0.20–1.20)
Total Protein: 6.6 g/dL (ref 6.4–8.3)

## 2014-08-24 LAB — IRON AND TIBC CHCC
%SAT: 15 % — ABNORMAL LOW (ref 21–57)
Iron: 46 ug/dL (ref 41–142)
TIBC: 300 ug/dL (ref 236–444)
UIBC: 254 ug/dL (ref 120–384)

## 2014-08-24 LAB — CBC WITH DIFFERENTIAL/PLATELET
BASO%: 0.2 % (ref 0.0–2.0)
Basophils Absolute: 0 10*3/uL (ref 0.0–0.1)
EOS ABS: 0.1 10*3/uL (ref 0.0–0.5)
EOS%: 1.9 % (ref 0.0–7.0)
HEMATOCRIT: 38 % (ref 34.8–46.6)
HGB: 12.5 g/dL (ref 11.6–15.9)
LYMPH%: 33.9 % (ref 14.0–49.7)
MCH: 30.9 pg (ref 25.1–34.0)
MCHC: 32.9 g/dL (ref 31.5–36.0)
MCV: 94.1 fL (ref 79.5–101.0)
MONO#: 0.5 10*3/uL (ref 0.1–0.9)
MONO%: 7.9 % (ref 0.0–14.0)
NEUT%: 56.1 % (ref 38.4–76.8)
NEUTROS ABS: 3.5 10*3/uL (ref 1.5–6.5)
Platelets: 208 10*3/uL (ref 145–400)
RBC: 4.04 10*6/uL (ref 3.70–5.45)
RDW: 13.3 % (ref 11.2–14.5)
WBC: 6.2 10*3/uL (ref 3.9–10.3)
lymph#: 2.1 10*3/uL (ref 0.9–3.3)

## 2014-08-24 NOTE — Progress Notes (Signed)
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  She has had a Greenfield filter in place since 2004 prior to her gastric bypass surgery that was performed in [place].    PRIOR THERAPY: 1.  H/o DVT after surgical procedures, patient has Greenfield filter in place since 2004.    2. H/o B12 deficiency d/t gastric bypass surgery.  3. H/o iron deficiency requiring parenteral iron in the past  CURRENT THERAPY: Observation   INTERVAL HISTORY: [name] 53 y.o. female returns for evaluation and follow up.  She continues to feel very fatigued and like she is in a fog.  In the beginning of August she saw Dr. Loanne Drilling regarding her thyroid and in the remaining portion of her thyroid she has  A mass.  She is constipated has had hair thinning, She is taking Miralax when she feels "really constipated" but not every day.  She is having bowel movements once very 4-6 days.  She wants to know if she needs IV iron.  Otherwise, she denies fevers, chills, nausea, vomiting, diarrhea, numbness, tingling, new pain, headaches, dizziness, or any other concerns.    MEDICAL HISTORY: Past Medical History  Diagnosis Date  . Anemia   . GERD (gastroesophageal reflux disease)   . Thyroid disease   . Allergy   . Anxiety   . Depression   . History of DVT (deep vein thrombosis)   . Chronic venous insufficiency   . Greenfield filter in place     ALLERGIES:  is allergic to tetracyclines & related; latex; ramelteon; and zolpidem tartrate.  MEDICATIONS:  Current Outpatient Prescriptions  Medication Sig Dispense Refill  . cyanocobalamin (,VITAMIN B-12,) 1000 MCG/ML injection Inject 1 mL (1,000 mcg total) into the muscle every 30 (thirty) days.  10 mL  1  . cyclobenzaprine (FLEXERIL) 10 MG tablet TAKE 1 TABLET BY MOUTH TWICE DAILY AS NEEDED FOR MUSCLE SPASMS  60 tablet  0  .  levothyroxine (SYNTHROID, LEVOTHROID) 125 MCG tablet Take 1 tablet (125 mcg total) by mouth daily before breakfast.  30 tablet  11  . Multiple Vitamins-Iron (DAILY MULTIVITAMINS/IRON PO) Take 1 tablet by mouth daily.       . Syringe/Needle, Disp, 25G X 1" 1 ML MISC Use to inject vit b-12 once monthly dx:266.2  25 each  0  . EPINEPHrine (EPI-PEN) 0.3 mg/0.3 mL SOAJ injection Inject 0.3 mLs (0.3 mg total) into the muscle once.  2 Device  1  . Ergocalciferol (VITAMIN D2 PO) Take 1,000 Units by mouth every morning.      . polyethylene glycol (MIRALAX / GLYCOLAX) packet Take 17 g by mouth daily as needed. For constipation       No current facility-administered medications for this visit.    SURGICAL HISTORY:  Past Surgical History  Procedure Laterality Date  . Cesarean section  1998  . Cholecystectomy  1982  . Abdominal hysterectomy    . Tubal ligation  1998  . Tonsillectomy  1965  . Vein ligation  1995/1998    right leg  . Dilation and curettage of uterus  2003  . Biopsy thyroid  01/03    suggestive of Hashimoto's  . Gastric bypass    . Thyroidectomy  2008    left  . Fetal surgery for congenital hernia    . Craniectomy suboccipital w/ cervical laminectomy / chiari  05/2010    repair, Dr.Pool    REVIEW OF SYSTEMS:  A 10 point review of systems was conducted and is otherwise negative except for what is noted above.    PHYSICAL EXAMINATION: Blood pressure 110/70, pulse 67, temperature 98.4 F (36.9 C), temperature source Oral, resp. rate 18, height 5\' 4"  (1.626 m), weight 297 lb (134.718 kg). Body mass index is 50.95 kg/(m^2). GENERAL: Patient is a well appearing female in no acute distress HEENT:  Sclerae anicteric.  Oropharynx clear and moist. No ulcerations or evidence of oropharyngeal candidiasis. Neck is supple.  NODES:  No cervical, supraclavicular, or axillary lymphadenopathy palpated.  LUNGS:  Clear to auscultation bilaterally.  No wheezes or rhonchi. HEART:  Regular rate and  rhythm. No murmur appreciated. ABDOMEN:  Soft, nontender.  Positive, normoactive bowel sounds. No organomegaly palpated. MSK:  No focal spinal tenderness to palpation. Full range of motion bilaterally in the upper extremities. EXTREMITIES:  No peripheral edema.   SKIN:  Clear with no obvious rashes or skin changes. No nail dyscrasia. NEURO:  Nonfocal. Well oriented.  Appropriate affect. ECOG PERFORMANCE STATUS: 1 - Symptomatic but completely ambulatory  LABORATORY DATA: Lab Results  Component Value Date   WBC 6.2 08/24/2014   HGB 12.5 08/24/2014   HCT 38.0 08/24/2014   MCV 94.1 08/24/2014   PLT 208 08/24/2014      Chemistry      Component Value Date/Time   NA 141 05/18/2014 0804   NA 145 02/08/2014 0848   NA 136 08/20/2010 1309   K 3.9 05/18/2014 0804   K 3.5 02/08/2014 0848   K 3.9 08/20/2010 1309   CL 106 05/18/2014 0804   CL 97* 08/20/2010 1309   CO2 29 05/18/2014 0804   CO2 27 02/08/2014 0848   CO2 32 08/20/2010 1309   BUN 7 05/18/2014 0804   BUN 5.4* 02/08/2014 0848   BUN 9 08/20/2010 1309   CREATININE 0.5 05/18/2014 0804   CREATININE 0.6 02/08/2014 0848   CREATININE 0.44* 10/14/2012 1539      Component Value Date/Time   CALCIUM 8.8 05/18/2014 0804   CALCIUM 9.2 02/08/2014 0848   CALCIUM 8.7 08/20/2010 1309   CALCIUM 8.6 10/03/2007 2218   ALKPHOS 120* 05/18/2014 0804   ALKPHOS 145 02/08/2014 0848   ALKPHOS 68 08/20/2010 1309   AST 17 05/18/2014 0804   AST 15 02/08/2014 0848   AST 26 08/20/2010 1309   ALT 13 05/18/2014 0804   ALT 12 02/08/2014 0848   ALT 36 08/20/2010 1309   BILITOT 1.2 05/18/2014 0804   BILITOT 1.26* 02/08/2014 0848   BILITOT 1.50 08/20/2010 1309     RADIOGRAPHIC STUDIES:  No results found.  ASSESSMENT/PLAN:   [name] is a 53 y/o with h/o b12 deficiency and iron deficiency.  Right now, it sounds like most of her problems are related to her hypothyroidism.  Her labs are pending.  She does follow with Dr. Silvio Pate for her B12 injections.  I will call her with her lab results  and we will check her labs every three months.  She will return in 6 months for labs and evaluation.    All questions were answered. The patient knows to call the clinic with any problems, questions or concerns. We can certainly see the patient much sooner if necessary.  I spent 15 minutes counseling the patient face to face. The total time spent in the appointment was 30 minutes.  Minette Headland, [place] [phone]  08/24/2014, 2:41 PM

## 2014-08-25 LAB — VITAMIN D 25 HYDROXY (VIT D DEFICIENCY, FRACTURES): VIT D 25 HYDROXY: 20 ng/mL — AB (ref 30–89)

## 2014-08-29 ENCOUNTER — Telehealth: Payer: Self-pay | Admitting: Adult Health

## 2014-08-29 ENCOUNTER — Telehealth: Payer: Self-pay | Admitting: *Deleted

## 2014-08-29 ENCOUNTER — Other Ambulatory Visit: Payer: Self-pay | Admitting: *Deleted

## 2014-08-29 DIAGNOSIS — E559 Vitamin D deficiency, unspecified: Secondary | ICD-10-CM | POA: Insufficient documentation

## 2014-08-29 DIAGNOSIS — D649 Anemia, unspecified: Secondary | ICD-10-CM

## 2014-08-29 DIAGNOSIS — Z8639 Personal history of other endocrine, nutritional and metabolic disease: Secondary | ICD-10-CM | POA: Insufficient documentation

## 2014-08-29 MED ORDER — CYCLOBENZAPRINE HCL 10 MG PO TABS: 10.0000 mg | ORAL_TABLET | Freq: Two times a day (BID) | ORAL | Status: DC | PRN

## 2014-08-29 NOTE — Telephone Encounter (Signed)
Called to inform pt of Vit D level(20). Pt is taking her Vit D 1000 units daily, but did mention she has a problem with absorption because of past gastric bypass surgery. Pt did agree to come back in for repeat labs and ferritin level this week. I will put in a pof. Pt verbalized understanding. No further concerns. Message to be forwarded to Charlestine Massed, NP

## 2014-08-29 NOTE — Telephone Encounter (Signed)
per pof to sch lab-per Orson Eva she will contact pt with date & time

## 2014-08-29 NOTE — Telephone Encounter (Signed)
cld & left pt message in re to appt times & date-will mail pt copy of sch

## 2014-08-30 ENCOUNTER — Telehealth: Payer: Self-pay | Admitting: Adult Health

## 2014-08-30 NOTE — Telephone Encounter (Signed)
pt cld left VM-cld pt back N?A left messege. No reason for call left on VM

## 2014-08-31 ENCOUNTER — Other Ambulatory Visit (HOSPITAL_BASED_OUTPATIENT_CLINIC_OR_DEPARTMENT_OTHER): Payer: Medicare Other

## 2014-08-31 ENCOUNTER — Telehealth: Payer: Self-pay | Admitting: *Deleted

## 2014-08-31 ENCOUNTER — Other Ambulatory Visit: Payer: Self-pay | Admitting: *Deleted

## 2014-08-31 DIAGNOSIS — D649 Anemia, unspecified: Secondary | ICD-10-CM

## 2014-08-31 DIAGNOSIS — Z862 Personal history of diseases of the blood and blood-forming organs and certain disorders involving the immune mechanism: Secondary | ICD-10-CM

## 2014-08-31 DIAGNOSIS — E559 Vitamin D deficiency, unspecified: Secondary | ICD-10-CM

## 2014-08-31 LAB — FERRITIN CHCC: FERRITIN: 45 ng/mL (ref 9–269)

## 2014-08-31 NOTE — Telephone Encounter (Signed)
Called to inform pt to increase Vit D to 2000 IU's daily per NP. Currently she is taking 1000 IU's daily. Also inform pt about ferritin level(45) that was drawn today. Pt wanted to know if we would consider IV fereheme since she is lower than before. I will forward this concern to Charlestine Massed, NP and call pt back. Message to be forwarded to Charlestine Massed, NP.

## 2014-09-06 ENCOUNTER — Telehealth: Payer: Self-pay | Admitting: *Deleted

## 2014-09-06 NOTE — Telephone Encounter (Signed)
Pt notified that she will not need a ferritin infusion at this time. Her ferritin level will be rechecked in Dec. Pt verbalizes understanding.

## 2014-09-21 ENCOUNTER — Other Ambulatory Visit: Payer: Self-pay

## 2014-09-21 MED ORDER — LEVOTHYROXINE SODIUM 125 MCG PO TABS: 125.0000 ug | ORAL_TABLET | Freq: Every day | ORAL | Status: DC

## 2014-11-16 ENCOUNTER — Telehealth: Payer: Self-pay | Admitting: Hematology and Oncology

## 2014-11-19 ENCOUNTER — Other Ambulatory Visit: Payer: Self-pay | Admitting: Internal Medicine

## 2014-11-26 ENCOUNTER — Other Ambulatory Visit: Payer: Self-pay | Admitting: Otolaryngology

## 2014-11-26 DIAGNOSIS — K219 Gastro-esophageal reflux disease without esophagitis: Secondary | ICD-10-CM | POA: Diagnosis not present

## 2014-11-26 DIAGNOSIS — E041 Nontoxic single thyroid nodule: Secondary | ICD-10-CM | POA: Diagnosis not present

## 2014-11-26 DIAGNOSIS — M2662 Arthralgia of temporomandibular joint: Secondary | ICD-10-CM | POA: Diagnosis not present

## 2014-11-26 DIAGNOSIS — R1319 Other dysphagia: Secondary | ICD-10-CM | POA: Diagnosis not present

## 2014-11-26 DIAGNOSIS — Z91040 Latex allergy status: Secondary | ICD-10-CM | POA: Diagnosis not present

## 2014-11-27 ENCOUNTER — Ambulatory Visit
Admission: RE | Admit: 2014-11-27 | Discharge: 2014-11-27 | Disposition: A | Discharge status: Home / Self Care | Payer: Medicare Other | Source: Ambulatory Visit | Attending: Otolaryngology | Admitting: Otolaryngology

## 2014-11-27 DIAGNOSIS — K219 Gastro-esophageal reflux disease without esophagitis: Secondary | ICD-10-CM | POA: Diagnosis not present

## 2014-11-27 DIAGNOSIS — K449 Diaphragmatic hernia without obstruction or gangrene: Secondary | ICD-10-CM | POA: Diagnosis not present

## 2014-11-28 ENCOUNTER — Other Ambulatory Visit (HOSPITAL_BASED_OUTPATIENT_CLINIC_OR_DEPARTMENT_OTHER): Payer: Medicare Other

## 2014-11-28 DIAGNOSIS — Z862 Personal history of diseases of the blood and blood-forming organs and certain disorders involving the immune mechanism: Secondary | ICD-10-CM | POA: Diagnosis not present

## 2014-11-28 DIAGNOSIS — E559 Vitamin D deficiency, unspecified: Secondary | ICD-10-CM

## 2014-11-28 DIAGNOSIS — Z8639 Personal history of other endocrine, nutritional and metabolic disease: Secondary | ICD-10-CM

## 2014-11-28 DIAGNOSIS — E539 Vitamin B deficiency, unspecified: Secondary | ICD-10-CM | POA: Diagnosis not present

## 2014-11-28 LAB — CBC WITH DIFFERENTIAL/PLATELET
BASO%: 0.6 % (ref 0.0–2.0)
Basophils Absolute: 0 10*3/uL (ref 0.0–0.1)
EOS%: 2.7 % (ref 0.0–7.0)
Eosinophils Absolute: 0.2 10*3/uL (ref 0.0–0.5)
HCT: 40.5 % (ref 34.8–46.6)
HGB: 12.9 g/dL (ref 11.6–15.9)
LYMPH#: 2.8 10*3/uL (ref 0.9–3.3)
LYMPH%: 41.5 % (ref 14.0–49.7)
MCH: 30 pg (ref 25.1–34.0)
MCHC: 31.7 g/dL (ref 31.5–36.0)
MCV: 94.6 fL (ref 79.5–101.0)
MONO#: 0.4 10*3/uL (ref 0.1–0.9)
MONO%: 6.7 % (ref 0.0–14.0)
NEUT#: 3.2 10*3/uL (ref 1.5–6.5)
NEUT%: 48.5 % (ref 38.4–76.8)
PLATELETS: 214 10*3/uL (ref 145–400)
RBC: 4.28 10*6/uL (ref 3.70–5.45)
RDW: 12.9 % (ref 11.2–14.5)
WBC: 6.6 10*3/uL (ref 3.9–10.3)

## 2014-11-28 LAB — COMPREHENSIVE METABOLIC PANEL (CC13)
ALBUMIN: 3.6 g/dL (ref 3.5–5.0)
ALT: 14 U/L (ref 0–55)
ANION GAP: 11 meq/L (ref 3–11)
AST: 16 U/L (ref 5–34)
Alkaline Phosphatase: 146 U/L (ref 40–150)
BILIRUBIN TOTAL: 0.73 mg/dL (ref 0.20–1.20)
BUN: 3.7 mg/dL — AB (ref 7.0–26.0)
CO2: 25 meq/L (ref 22–29)
Calcium: 8.8 mg/dL (ref 8.4–10.4)
Chloride: 106 mEq/L (ref 98–109)
Creatinine: 0.7 mg/dL (ref 0.6–1.1)
GLUCOSE: 100 mg/dL (ref 70–140)
Potassium: 3.6 mEq/L (ref 3.5–5.1)
Sodium: 143 mEq/L (ref 136–145)
Total Protein: 6.6 g/dL (ref 6.4–8.3)

## 2014-11-29 LAB — VITAMIN B12: Vitamin B-12: 281 pg/mL (ref 211–911)

## 2014-11-29 LAB — FERRITIN CHCC: FERRITIN: 38 ng/mL (ref 9–269)

## 2014-11-29 LAB — IRON AND TIBC CHCC
%SAT: 13 % — ABNORMAL LOW (ref 21–57)
IRON: 44 ug/dL (ref 41–142)
TIBC: 331 ug/dL (ref 236–444)
UIBC: 288 ug/dL (ref 120–384)

## 2014-11-29 LAB — VITAMIN D 25 HYDROXY (VIT D DEFICIENCY, FRACTURES): Vit D, 25-Hydroxy: 11 ng/mL — ABNORMAL LOW (ref 30–100)

## 2014-12-06 ENCOUNTER — Other Ambulatory Visit: Payer: Medicare Other

## 2014-12-10 ENCOUNTER — Other Ambulatory Visit (INDEPENDENT_AMBULATORY_CARE_PROVIDER_SITE_OTHER): Payer: Medicare Other

## 2014-12-10 DIAGNOSIS — E041 Nontoxic single thyroid nodule: Secondary | ICD-10-CM | POA: Diagnosis not present

## 2014-12-11 LAB — TSH: TSH: 0.06 u[IU]/mL — AB (ref 0.35–4.50)

## 2014-12-13 ENCOUNTER — Other Ambulatory Visit: Payer: Self-pay | Admitting: Endocrinology

## 2014-12-13 DIAGNOSIS — E890 Postprocedural hypothyroidism: Secondary | ICD-10-CM

## 2014-12-13 MED ORDER — LEVOTHYROXINE SODIUM 112 MCG PO TABS: 112.0000 ug | ORAL_TABLET | Freq: Every day | ORAL | Status: DC

## 2014-12-25 DIAGNOSIS — E041 Nontoxic single thyroid nodule: Secondary | ICD-10-CM | POA: Diagnosis not present

## 2014-12-25 DIAGNOSIS — R1319 Other dysphagia: Secondary | ICD-10-CM | POA: Diagnosis not present

## 2014-12-25 DIAGNOSIS — K219 Gastro-esophageal reflux disease without esophagitis: Secondary | ICD-10-CM | POA: Diagnosis not present

## 2014-12-25 DIAGNOSIS — E236 Other disorders of pituitary gland: Secondary | ICD-10-CM | POA: Diagnosis not present

## 2014-12-25 DIAGNOSIS — J310 Chronic rhinitis: Secondary | ICD-10-CM | POA: Diagnosis not present

## 2014-12-27 ENCOUNTER — Other Ambulatory Visit: Payer: Self-pay | Admitting: Otolaryngology

## 2014-12-27 DIAGNOSIS — E236 Other disorders of pituitary gland: Secondary | ICD-10-CM

## 2015-01-04 ENCOUNTER — Ambulatory Visit
Admission: RE | Admit: 2015-01-04 | Discharge: 2015-01-04 | Disposition: A | Discharge status: Home / Self Care | Payer: Medicare Other | Source: Ambulatory Visit | Attending: Otolaryngology | Admitting: Otolaryngology

## 2015-01-04 DIAGNOSIS — E236 Other disorders of pituitary gland: Secondary | ICD-10-CM | POA: Diagnosis not present

## 2015-01-04 DIAGNOSIS — G935 Compression of brain: Secondary | ICD-10-CM | POA: Diagnosis not present

## 2015-01-04 MED ORDER — GADOBENATE DIMEGLUMINE 529 MG/ML IV SOLN
10.0000 mL | Freq: Once | INTRAVENOUS | Status: AC | PRN
  Administered 2015-01-04: 10 mL via INTRAVENOUS

## 2015-01-09 ENCOUNTER — Encounter: Payer: Self-pay | Admitting: Internal Medicine

## 2015-01-22 ENCOUNTER — Other Ambulatory Visit: Payer: Self-pay | Admitting: Internal Medicine

## 2015-01-23 NOTE — Telephone Encounter (Signed)
Approved: #[phone] x 3 

## 2015-01-23 NOTE — Telephone Encounter (Signed)
Last filled 11/19/14 with 1 refill--please advise

## 2015-01-24 NOTE — Telephone Encounter (Signed)
Rx sent through e-scribe  

## 2015-02-27 ENCOUNTER — Other Ambulatory Visit: Payer: Self-pay

## 2015-02-27 ENCOUNTER — Ambulatory Visit: Payer: Medicare Other | Admitting: Adult Health

## 2015-02-27 ENCOUNTER — Other Ambulatory Visit: Payer: Medicare Other

## 2015-02-27 DIAGNOSIS — D509 Iron deficiency anemia, unspecified: Secondary | ICD-10-CM

## 2015-02-28 ENCOUNTER — Ambulatory Visit: Payer: Medicare Other | Admitting: Hematology

## 2015-02-28 ENCOUNTER — Other Ambulatory Visit: Payer: Medicare Other

## 2015-02-28 ENCOUNTER — Telehealth: Payer: Self-pay | Admitting: Hematology and Oncology

## 2015-02-28 ENCOUNTER — Ambulatory Visit: Payer: Medicare Other | Admitting: Hematology and Oncology

## 2015-02-28 NOTE — Telephone Encounter (Signed)
S/w pt as she called in to r/s her appt today due to not feeling well   [name]

## 2015-03-11 ENCOUNTER — Other Ambulatory Visit: Payer: Self-pay | Admitting: Endocrinology

## 2015-03-15 ENCOUNTER — Telehealth: Payer: Self-pay | Admitting: Hematology and Oncology

## 2015-03-15 ENCOUNTER — Inpatient Hospital Stay: Payer: Medicare Other | Attending: Adult Health | Admitting: Hematology and Oncology

## 2015-03-15 ENCOUNTER — Other Ambulatory Visit (HOSPITAL_BASED_OUTPATIENT_CLINIC_OR_DEPARTMENT_OTHER): Payer: Medicare Other

## 2015-03-15 VITALS — BP 134/79 | HR 63 | Temp 98.2°F | Resp 18 | Ht 64.0 in | Wt 301.0 lb

## 2015-03-15 DIAGNOSIS — Z7901 Long term (current) use of anticoagulants: Secondary | ICD-10-CM | POA: Diagnosis not present

## 2015-03-15 DIAGNOSIS — E538 Deficiency of other specified B group vitamins: Secondary | ICD-10-CM

## 2015-03-15 DIAGNOSIS — Z86718 Personal history of other venous thrombosis and embolism: Secondary | ICD-10-CM

## 2015-03-15 DIAGNOSIS — Z9884 Bariatric surgery status: Secondary | ICD-10-CM | POA: Diagnosis not present

## 2015-03-15 DIAGNOSIS — D509 Iron deficiency anemia, unspecified: Secondary | ICD-10-CM

## 2015-03-15 LAB — COMPREHENSIVE METABOLIC PANEL (CC13)
ALT: 15 U/L (ref 0–55)
AST: 16 U/L (ref 5–34)
Albumin: 3.4 g/dL — ABNORMAL LOW (ref 3.5–5.0)
Alkaline Phosphatase: 146 U/L (ref 40–150)
Anion Gap: 9 mEq/L (ref 3–11)
BILIRUBIN TOTAL: 1.36 mg/dL — AB (ref 0.20–1.20)
BUN: 5.5 mg/dL — AB (ref 7.0–26.0)
CO2: 28 mEq/L (ref 22–29)
Calcium: 8.5 mg/dL (ref 8.4–10.4)
Chloride: 106 mEq/L (ref 98–109)
Creatinine: 0.6 mg/dL (ref 0.6–1.1)
EGFR: >90 mL/min/{1.73_m2} (ref >90)
GLUCOSE: 98 mg/dL (ref 70–140)
Potassium: 3.8 mEq/L (ref 3.5–5.1)
SODIUM: 143 meq/L (ref 136–145)
TOTAL PROTEIN: 6.3 g/dL — AB (ref 6.4–8.3)

## 2015-03-15 LAB — CBC WITH DIFFERENTIAL/PLATELET
BASO%: 0.8 % (ref 0.0–2.0)
Basophils Absolute: 0 10*3/uL (ref 0.0–0.1)
EOS%: 3.5 % (ref 0.0–7.0)
Eosinophils Absolute: 0.2 10*3/uL (ref 0.0–0.5)
HCT: 38.9 % (ref 34.8–46.6)
HGB: 12.5 g/dL (ref 11.6–15.9)
LYMPH%: 41 % (ref 14.0–49.7)
MCH: 30.2 pg (ref 25.1–34.0)
MCHC: 32.2 g/dL (ref 31.5–36.0)
MCV: 93.6 fL (ref 79.5–101.0)
MONO#: 0.4 10*3/uL (ref 0.1–0.9)
MONO%: 7.2 % (ref 0.0–14.0)
NEUT#: 2.6 10*3/uL (ref 1.5–6.5)
NEUT%: 47.5 % (ref 38.4–76.8)
Platelets: 202 10*3/uL (ref 145–400)
RBC: 4.16 10*6/uL (ref 3.70–5.45)
RDW: 13.2 % (ref 11.2–14.5)
WBC: 5.4 10*3/uL (ref 3.9–10.3)
lymph#: 2.2 10*3/uL (ref 0.9–3.3)

## 2015-03-15 NOTE — Telephone Encounter (Signed)
Per 3/25 pof patient will call for her one year appointment

## 2015-03-15 NOTE — Progress Notes (Signed)
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 She has had a Greenfield filter in place since 2004 prior to her gastric bypass surgery that was performed in [place].   PRIOR THERAPY: 1. H/o DVT after surgical procedures, patient has Greenfield filter in place since 2004.   2. H/o B12 deficiency d/t gastric bypass surgery.  3. H/o iron deficiency requiring parenteral iron in the past  CURRENT THERAPY: Observation  CHIEF COMPLIANT:  Gaining weight  INTERVAL HISTORY: [name] is a 54 year old lady who had gastric bypass surgery and had lost over 200 pounds very rapidly and nearly went into shocklike situation. Previously she was profoundly iron deficient with hemoglobin dropping down to 3 g. She had required blood transfusions and iron infusions all the way up to 2013. She has not required any IV iron therapy since then. She was found to be chronically B-12 deficient and was started on B-12 injections and patient does it at home. She has been steadily gaining weight and would like to see another surgeon to see if something can be done about it. She has a prior history of DVT for which he took anticoagulation for 10 years.  REVIEW OF SYSTEMS:   Constitutional: Denies fevers, chills or abnormal weight loss Eyes: Denies blurriness of vision Ears, nose, mouth, throat, and face: Denies mucositis or sore throat Respiratory: Denies cough, dyspnea or wheezes Cardiovascular: Denies palpitation, chest discomfort or lower extremity swelling Gastrointestinal:  Denies nausea, heartburn or change in bowel habits Skin: Denies abnormal skin rashes Lymphatics: Denies new lymphadenopathy or easy bruising Neurological: Chronic leg problems Behavioral/Psych: Mood is stable, no new changes   All other systems were reviewed with the patient and are negative.  I have reviewed the past medical history,  past surgical history, social history and family history with the patient and they are unchanged from previous note.  ALLERGIES:  is allergic to tetracyclines & related; latex; ramelteon; and zolpidem tartrate.  MEDICATIONS:  Current Outpatient Prescriptions  Medication Sig Dispense Refill  . cyanocobalamin (,VITAMIN B-12,) 1000 MCG/ML injection Inject 1 mL (1,000 mcg total) into the muscle every 30 (thirty) days. 10 mL 1  . cyclobenzaprine (FLEXERIL) 10 MG tablet TAKE 1 TABLET (10 MG TOTAL) BY MOUTH 2 (TWO) TIMES DAILY AS NEEDED FOR MUSCLE SPASMS. 60 tablet 3  . EPINEPHrine (EPI-PEN) 0.3 mg/0.3 mL SOAJ injection Inject 0.3 mLs (0.3 mg total) into the muscle once. 2 Device 1  . Ergocalciferol (VITAMIN D2 PO) Take 1,000 Units by mouth every morning.    Marland Kitchen levothyroxine (SYNTHROID, LEVOTHROID) 112 MCG tablet TAKE 1 TABLET (112 MCG TOTAL) BY MOUTH DAILY BEFORE BREAKFAST. 90 tablet 0  . Multiple Vitamins-Iron (DAILY MULTIVITAMINS/IRON PO) Take 1 tablet by mouth daily.     . polyethylene glycol (MIRALAX / GLYCOLAX) packet Take 17 g by mouth daily as needed. For constipation    . Syringe/Needle, Disp, 25G X 1" 1 ML MISC Use to inject vit b-12 once monthly dx:266.2 25 each 0   No current facility-administered medications for this visit.    PHYSICAL EXAMINATION: ECOG PERFORMANCE STATUS: 1 - Symptomatic but completely ambulatory  Filed Vitals:   03/15/15 1053  BP: 134/79  Pulse: 63  Temp: 98.2 F (36.8 C)  Resp: 18   Filed Weights   03/15/15 1053  Weight: 301 lb (136.533 kg)    GENERAL:alert, no distress and comfortable SKIN: skin color,  texture, turgor are normal, no rashes or significant lesions EYES: normal, Conjunctiva are pink and non-injected, sclera clear OROPHARYNX:no exudate, no erythema and lips, buccal mucosa, and tongue normal  NECK: supple, thyroid normal size, non-tender, without nodularity LYMPH:  no palpable lymphadenopathy in the cervical, axillary or inguinal LUNGS:  clear to auscultation and percussion with normal breathing effort HEART: regular rate & rhythm and no murmurs and no lower extremity edema ABDOMEN:abdomen soft, non-tender and normal bowel sounds Musculoskeletal:no cyanosis of digits and no clubbing  NEURO: alert & oriented x 3 with fluent speech, no focal motor/sensory deficits  LABORATORY DATA:  I have reviewed the data as listed   Chemistry      Component Value Date/Time   NA 143 03/15/2015 1034   NA 141 05/18/2014 0804   NA 136 08/20/2010 1309   K 3.8 03/15/2015 1034   K 3.9 05/18/2014 0804   K 3.9 08/20/2010 1309   CL 106 05/18/2014 0804   CL 97* 08/20/2010 1309   CO2 28 03/15/2015 1034   CO2 29 05/18/2014 0804   CO2 32 08/20/2010 1309   BUN 5.5* 03/15/2015 1034   BUN 7 05/18/2014 0804   BUN 9 08/20/2010 1309   CREATININE 0.6 03/15/2015 1034   CREATININE 0.5 05/18/2014 0804   CREATININE 0.44* 10/14/2012 1539      Component Value Date/Time   CALCIUM 8.5 03/15/2015 1034   CALCIUM 8.8 05/18/2014 0804   CALCIUM 8.7 08/20/2010 1309   CALCIUM 8.6 10/03/2007 2218   ALKPHOS 146 03/15/2015 1034   ALKPHOS 120* 05/18/2014 0804   ALKPHOS 68 08/20/2010 1309   AST 16 03/15/2015 1034   AST 17 05/18/2014 0804   AST 26 08/20/2010 1309   ALT 15 03/15/2015 1034   ALT 13 05/18/2014 0804   ALT 36 08/20/2010 1309   BILITOT 1.36* 03/15/2015 1034   BILITOT 1.2 05/18/2014 0804   BILITOT 1.50 08/20/2010 1309       Lab Results  Component Value Date   WBC 5.4 03/15/2015   HGB 12.5 03/15/2015   HCT 38.9 03/15/2015   MCV 93.6 03/15/2015   PLT 202 03/15/2015   NEUTROABS 2.6 03/15/2015    ASSESSMENT & PLAN:  Vitamin B 12 deficiency Due to gastric bypass surgery. Patient gives herself B-12 supplementation once a month 1000 micrograms Continue with the same treatment. Recheck B-12 levels in one year. The computer is not allowing me to enter the B-12 orders for next year. She will call us in 4 months of that we can order all those  labs.   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 I do not find results yet. In the past she had required IV iron treatment. It appears that she may be absorbing her iron again. She has not required IV iron in over 2 years. Recheck iron studies next year.   DVT, HX OF 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 No further problems with blood clots at this time.    Orders Placed This Encounter  Procedures  . CBC with Differential    Standing Status: Future     Number of Occurrences:      Standing Expiration Date: 03/14/2016  . Iron and TIBC [facility]    Standing Status: Future     Number of Occurrences:      Standing Expiration Date: 03/14/2016  . Ferritin  Standing Status: Future     Number of Occurrences:      Standing Expiration Date: 03/14/2016   The patient has a good understanding of the overall plan. she agrees with it. She will call with any problems that may develop before her next visit here.   Rulon Eisenmenger, MD

## 2015-03-15 NOTE — Assessment & Plan Note (Signed)
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 I do not find results yet. In the past she had required IV iron treatment. It appears that she may be absorbing her iron again. She has not required IV iron in over 2 years. Recheck iron studies next year.

## 2015-03-15 NOTE — Assessment & Plan Note (Signed)
Due to gastric bypass surgery. Patient gives herself B-12 supplementation once a month 1000 micrograms Continue with the same treatment. Recheck B-12 levels in one year. The computer is not allowing me to enter the B-12 orders for next year. She will call us in 4 months of that we can order all those labs.

## 2015-03-15 NOTE — Assessment & Plan Note (Signed)
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 No further problems with blood clots at this time. 

## 2015-05-13 ENCOUNTER — Encounter: Payer: Self-pay | Admitting: Gastroenterology

## 2015-05-22 ENCOUNTER — Other Ambulatory Visit: Payer: Self-pay | Admitting: Internal Medicine

## 2015-05-22 NOTE — Telephone Encounter (Signed)
Approved: #[phone] x 3 

## 2015-05-22 NOTE — Telephone Encounter (Signed)
Ok to fill 

## 2015-05-24 ENCOUNTER — Telehealth: Payer: Self-pay | Admitting: Internal Medicine

## 2015-05-24 ENCOUNTER — Encounter: Payer: Medicare Other | Admitting: Internal Medicine

## 2015-05-24 DIAGNOSIS — Z0289 Encounter for other administrative examinations: Secondary | ICD-10-CM

## 2015-05-24 NOTE — Telephone Encounter (Signed)
Patient did not come in for their appointment today for cpe.  Please let me know if patient needs to be contacted immediately for follow up or no follow up needed. °

## 2015-05-24 NOTE — Telephone Encounter (Signed)
She is due for a physical in the next few months

## 2015-06-03 NOTE — Telephone Encounter (Signed)
Scheduled 6/21

## 2015-06-10 ENCOUNTER — Other Ambulatory Visit: Payer: Self-pay | Admitting: Endocrinology

## 2015-06-11 ENCOUNTER — Ambulatory Visit (INDEPENDENT_AMBULATORY_CARE_PROVIDER_SITE_OTHER): Payer: Medicare Other | Admitting: Internal Medicine

## 2015-06-11 ENCOUNTER — Encounter: Payer: Self-pay | Admitting: Internal Medicine

## 2015-06-11 VITALS — BP 130/80 | HR 70 | Temp 97.5°F | Ht 64.0 in | Wt 299.0 lb

## 2015-06-11 DIAGNOSIS — E038 Other specified hypothyroidism: Secondary | ICD-10-CM

## 2015-06-11 DIAGNOSIS — Z0000 Encounter for general adult medical examination without abnormal findings: Secondary | ICD-10-CM | POA: Diagnosis not present

## 2015-06-11 MED ORDER — CYANOCOBALAMIN 1000 MCG/ML IJ SOLN: 1000.0000 ug | INTRAMUSCULAR | Status: DC

## 2015-06-11 MED ORDER — EPINEPHRINE 0.3 MG/0.3ML IJ SOAJ: 0.3000 mg | Freq: Once | INTRAMUSCULAR | Status: DC

## 2015-06-11 NOTE — Patient Instructions (Signed)
Your mammogram is due around February 2017 Your colonoscopy is due November of this year.

## 2015-06-11 NOTE — Assessment & Plan Note (Signed)
Generally healthy Will work on fitness Flu shot this fall mammo due in February, colon in November

## 2015-06-11 NOTE — Progress Notes (Signed)
Subjective:    Patient ID: [name], female    DOB: 1961-07-27, 54 y.o.   MRN: [id number]  HPI Here for physical  Saw new hematologist---very happy with him Will see him yearly  Weight was up to 308# and back down some Trying to slowly get down--- hopes to get back under 250# Eating healthier Starting to walk some--using fitbit  1 son moved to [place] Another son now at [employer] Another son got a good opportunity for [employer] Life is really changing  Sees Dr Erik Obey for follow up of the thyroid He did her surgery Hasn't seen Dr Loanne Drilling for a while  Current Outpatient Prescriptions on File Prior to Visit  Medication Sig Dispense Refill  . cyclobenzaprine (FLEXERIL) 10 MG tablet TAKE 1 TABLET (10 MG TOTAL) BY MOUTH 2 (TWO) TIMES DAILY AS NEEDED FOR MUSCLE SPASMS. 60 tablet 3  . Ergocalciferol (VITAMIN D2 PO) Take 1,000 Units by mouth every morning.    Marland Kitchen levothyroxine (SYNTHROID, LEVOTHROID) 112 MCG tablet TAKE 1 TABLET (112 MCG TOTAL) BY MOUTH DAILY BEFORE BREAKFAST. 90 tablet 0  . Multiple Vitamins-Iron (DAILY MULTIVITAMINS/IRON PO) Take 1 tablet by mouth daily.     . polyethylene glycol (MIRALAX / GLYCOLAX) packet Take 17 g by mouth daily as needed. For constipation    . Syringe/Needle, Disp, 25G X 1" 1 ML MISC Use to inject vit b-12 once monthly dx:266.2 25 each 0   No current facility-administered medications on file prior to visit.    Allergies  Allergen Reactions  . Tetracyclines & Related Anaphylaxis    syncope  . Latex     REACTION: "anaphylactic shock"  . Ramelteon     REACTION: blacks out  . Zolpidem Tartrate     REACTION: black outs    Past Medical History  Diagnosis Date  . Anemia   . GERD (gastroesophageal reflux disease)   . Thyroid disease   . Allergy   . Anxiety   . Depression   . History of DVT (deep vein thrombosis)   . Chronic venous insufficiency   . Greenfield filter in place     Past Surgical History  Procedure  Laterality Date  . Cesarean section  1998  . Cholecystectomy  1982  . Abdominal hysterectomy    . Tubal ligation  1998  . Tonsillectomy  1965  . Vein ligation  1995/1998    right leg  . Dilation and curettage of uterus  2003  . Biopsy thyroid  01/03    suggestive of Hashimoto's  . Gastric bypass    . Thyroidectomy  2008    left  . Fetal surgery for congenital hernia    . Craniectomy suboccipital w/ cervical laminectomy / chiari  05/2010    repair, Dr.Pool    Family History  Problem Relation Age of Onset  . Arthritis Mother   . Coronary artery disease Father   . Coronary artery disease Sister   . Cancer Maternal Grandmother     Ovarian  . Thyroid disease Neg Hx     History   Social History  . Marital Status: Divorced    Spouse Name: N/A  . Number of Children: 3  . Years of Education: N/A   Occupational History  . Part time Environmental manager    Social History Main Topics  . Smoking status: Never Smoker   . Smokeless tobacco: Never Used  . Alcohol Use: Yes     Comment: rare  . Drug Use: No  .  Sexual Activity: Not on file   Other Topics Concern  . Not on file   Social History Narrative               Review of Systems  Constitutional: Negative for fatigue and unexpected weight change.       Wears seat belt  HENT: Negative for hearing loss and tinnitus.        Hasn't been able to tolerate denture adhesive  Eyes: Negative for visual disturbance.       No diplopia or unilateral vision loss  Respiratory: Positive for shortness of breath. Negative for cough and chest tightness.        Stable DOE from obesity  Cardiovascular: Positive for leg swelling. Negative for chest pain and palpitations.  Gastrointestinal: Positive for constipation. Negative for nausea, vomiting, abdominal pain and blood in stool.       Uses miralax prn  Endocrine: Negative for polydipsia and polyuria.  Genitourinary: Positive for urgency and frequency.       No incontinence No  sex-- no problem  Musculoskeletal: Positive for back pain. Negative for joint swelling and arthralgias.       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  Allergic/Immunologic: Negative for immunocompromised state.  Neurological: Positive for dizziness and tremors. Negative for syncope, weakness and light-headedness.       Mild tremor in left hand No major handwriting change No gait problems Some numbness and tingling in hands  Some frontal headaches  Hematological: Negative for adenopathy. Does not bruise/bleed easily.  Psychiatric/Behavioral: Positive for sleep disturbance. Negative for dysphoric mood. The patient is not nervous/anxious.        Some success with tylenol PM Discussed trying melatonin       Objective:   Physical Exam  Constitutional: She is oriented to person, place, and time. She appears well-developed and well-nourished. No distress.  HENT:  Head: Normocephalic and atraumatic.  Right Ear: External ear normal.  Left Ear: External ear normal.  Mouth/Throat: Oropharynx is clear and moist. No oropharyngeal exudate.  Eyes: Conjunctivae and EOM are normal. Pupils are equal, round, and reactive to light.  Neck: Normal range of motion. Neck supple. No thyromegaly present.  Cardiovascular: Normal rate, regular rhythm, normal heart sounds and intact distal pulses.  Exam reveals no gallop.   No murmur heard. Pulmonary/Chest: Effort normal and breath sounds normal. No respiratory distress. She has no wheezes. She has no rales.  Abdominal: Soft. There is no tenderness.  Musculoskeletal: She exhibits no edema or tenderness.  Lymphadenopathy:    She has no cervical adenopathy.  Neurological: She is alert and oriented to person, place, and time.  Skin: No rash noted. No erythema.  Psychiatric: She has a normal mood and affect. Her behavior is normal.          Assessment & Plan:

## 2015-06-11 NOTE — Progress Notes (Signed)
Pre visit review using our clinic review tool, if applicable. No additional management support is needed unless otherwise documented below in the visit note. 

## 2015-06-11 NOTE — Assessment & Plan Note (Signed)
Dr Loanne Drilling managing

## 2015-06-18 ENCOUNTER — Other Ambulatory Visit: Payer: Self-pay | Admitting: Internal Medicine

## 2015-07-05 ENCOUNTER — Other Ambulatory Visit: Payer: Self-pay | Admitting: Otolaryngology

## 2015-07-05 DIAGNOSIS — E041 Nontoxic single thyroid nodule: Secondary | ICD-10-CM

## 2015-07-18 ENCOUNTER — Other Ambulatory Visit: Payer: Self-pay | Admitting: Endocrinology

## 2015-09-04 ENCOUNTER — Ambulatory Visit (INDEPENDENT_AMBULATORY_CARE_PROVIDER_SITE_OTHER): Payer: Medicare Other | Admitting: Internal Medicine

## 2015-09-04 ENCOUNTER — Encounter: Payer: Self-pay | Admitting: Internal Medicine

## 2015-09-04 VITALS — HR 84 | Temp 97.6°F | Wt 301.0 lb

## 2015-09-04 DIAGNOSIS — N390 Urinary tract infection, site not specified: Secondary | ICD-10-CM

## 2015-09-04 DIAGNOSIS — R300 Dysuria: Secondary | ICD-10-CM | POA: Diagnosis not present

## 2015-09-04 DIAGNOSIS — Z23 Encounter for immunization: Secondary | ICD-10-CM | POA: Diagnosis not present

## 2015-09-04 LAB — POCT URINALYSIS DIPSTICK
BILIRUBIN UA: NEGATIVE
Glucose, UA: NEGATIVE
KETONES UA: NEGATIVE
Nitrite, UA: NEGATIVE
Spec Grav, UA: 1.01
Urobilinogen, UA: NEGATIVE
pH, UA: 7

## 2015-09-04 MED ORDER — SULFAMETHOXAZOLE-TRIMETHOPRIM 800-160 MG PO TABS: 1.0000 | ORAL_TABLET | Freq: Two times a day (BID) | ORAL | Status: DC

## 2015-09-04 NOTE — Addendum Note (Signed)
Addended by: Despina Hidden on: 09/04/2015 02:14 PM   Modules accepted: Orders

## 2015-09-04 NOTE — Progress Notes (Signed)
Pre visit review using our clinic review tool, if applicable. No additional management support is needed unless otherwise documented below in the visit note. 

## 2015-09-04 NOTE — Progress Notes (Signed)
Subjective:    Patient ID: [name], female    DOB: 01/03/1961, 54 y.o.   MRN: [id number]  HPI Here due to urinary symptoms Here with mom  "I wish I was dead" Last week urine was orange and smelled bad Then had some vaginal itching--used OTC monistat and this got better Then started with back pain Chills and bad back pressure Drinking lots of water--- and lots of frequency  No clear fever No night sweats Pressure with voiding--no clear pain No hematuria  Current Outpatient Prescriptions on File Prior to Visit  Medication Sig Dispense Refill  . cyanocobalamin (,VITAMIN B-12,) 1000 MCG/ML injection Inject 1 mL (1,000 mcg total) into the muscle every 30 (thirty) days. 10 mL 1  . cyclobenzaprine (FLEXERIL) 10 MG tablet TAKE 1 TABLET (10 MG TOTAL) BY MOUTH 2 (TWO) TIMES DAILY AS NEEDED FOR MUSCLE SPASMS. 60 tablet 3  . EPINEPHrine 0.3 mg/0.3 mL IJ SOAJ injection Inject 0.3 mLs (0.3 mg total) into the muscle once. 2 Device 1  . levothyroxine (SYNTHROID, LEVOTHROID) 112 MCG tablet TAKE 1 TABLET (112 MCG TOTAL) BY MOUTH DAILY BEFORE BREAKFAST. 90 tablet 0  . Multiple Vitamins-Iron (DAILY MULTIVITAMINS/IRON PO) Take 1 tablet by mouth daily.     . polyethylene glycol (MIRALAX / GLYCOLAX) packet Take 17 g by mouth daily as needed. For constipation    . SYRINGE-NEEDLE, DISP, 3 ML (B-D 3CC LUER-LOK SYR 25GX1") 25G X 1" 3 ML MISC Use to inject B-12 once monthly dx:E53.8 25 each 0   No current facility-administered medications on file prior to visit.    Allergies  Allergen Reactions  . Tetracyclines & Related Anaphylaxis    syncope  . Latex     REACTION: "anaphylactic shock"  . Ramelteon     REACTION: blacks out  . Zolpidem Tartrate     REACTION: black outs    Past Medical History  Diagnosis Date  . Anemia   . GERD (gastroesophageal reflux disease)   . Thyroid disease   . Allergy   . Anxiety   . Depression   . History of DVT (deep vein thrombosis)   . Chronic venous  insufficiency   . Greenfield filter in place     Past Surgical History  Procedure Laterality Date  . Cesarean section  1998  . Cholecystectomy  1982  . Abdominal hysterectomy    . Tubal ligation  1998  . Tonsillectomy  1965  . Vein ligation  1995/1998    right leg  . Dilation and curettage of uterus  2003  . Biopsy thyroid  01/03    suggestive of Hashimoto's  . Gastric bypass    . Thyroidectomy  2008    left  . Fetal surgery for congenital hernia    . Craniectomy suboccipital w/ cervical laminectomy / chiari  05/2010    repair, Dr.Pool    Family History  Problem Relation Age of Onset  . Arthritis Mother   . Coronary artery disease Father   . Coronary artery disease Sister   . Cancer Maternal Grandmother     Ovarian  . Thyroid disease Neg Hx     Social History   Social History  . Marital Status: Divorced    Spouse Name: N/A  . Number of Children: 3  . Years of Education: N/A   Occupational History  . Part time Environmental manager    Social History Main Topics  . Smoking status: Never Smoker   . Smokeless tobacco: Never Used  .  Alcohol Use: Yes     Comment: rare  . Drug Use: No  . Sexual Activity: Not on file   Other Topics Concern  . Not on file   Social History Narrative               Review of Systems No vomiting--mild nausea Appetite is off Chronic constipation--no change   Objective:   Physical Exam  Abdominal: Soft. Bowel sounds are normal. She exhibits no distension. There is no tenderness. There is no rebound and no guarding.  Musculoskeletal:  CVA tenderness right > left          Assessment & Plan:

## 2015-09-04 NOTE — Assessment & Plan Note (Signed)
Some systemic symptoms and clearly abnormal urine Doesn't seem sick enough for pyelo No previous infections Will treat with septra Only send culture if recurrent symptoms

## 2015-09-13 ENCOUNTER — Other Ambulatory Visit: Payer: Self-pay | Admitting: Endocrinology

## 2015-09-13 NOTE — Telephone Encounter (Signed)
Please advise if we can refill last office visit was 07/31/2014.

## 2015-09-13 NOTE — Telephone Encounter (Signed)
please call patient: Options: Refill x 1, and f/u here Refill and f/u with Dr Silvio Pate

## 2015-09-18 ENCOUNTER — Other Ambulatory Visit: Payer: Self-pay | Admitting: Internal Medicine

## 2015-09-19 NOTE — Progress Notes (Signed)
Chief Complaint  Patient presents with  . Leg Swelling    History of Present Illness: 54 yo WF with history of DVT both legs, first in 1996 during trauma, second in 1997 after vein ligation as well as chronic lower extremity edema who was last seen here in December 2013. She had been on coumadin but this was stopped in 2008 by Dr. Albertine Patricia because she was non-compliant and there was no evidence of hypercoagulable state. She has had a negative workup within the last 5 years in hematology by Dr. Humphrey Rolls. She has also been seen in the past by Dr. Eilleen Kempf in [facility] for vein stripping. She had prior vein stripping in [place]. Echo December 2010 with normal LV size and function and no significant valvular disease. Venous dopplers April 2011 with chronic left lower ext DVT, no right LE DVT. She has an IVC filter in place. When I saw her last month, she told me that she had been feeling weak and had felt her heart racing. She felt dizzy and sweaty. This has happened several times over the last few months. She did not pass out. No chest pain. No SOB. She does have continued left leg pain with some swelling. She has h/o DVT as above with left leg chronic DVT by dopplers 2011, not on coumadin currently. Because of her palpitations, I arranged a 21 day event monitor December 2013 which did not show any evidence of PACs, PVCs, SVT or atrial fibrillation. Venous dopplers showed chronic, non-occlusive thrombus isolated to the left popliteal vein but no thrombus in the right lower extremity venous system. Echo with normal LV size and function, grade 1 diastolic dysfunction, mildly dilated left atrium. She has not been seen in our office since January 2014.   She is here today for follow up. She is doing well overall. She has no change in her LE swelling. No palpitations.   Primary Care Physician: Silvio Pate  Last Lipid Profile:  Past Medical History  Diagnosis Date  . Anemia   . GERD (gastroesophageal reflux  disease)   . Thyroid disease   . Allergy   . Anxiety   . Depression   . History of DVT (deep vein thrombosis)   . Chronic venous insufficiency   . Greenfield filter in place     Past Surgical History  Procedure Laterality Date  . Cesarean section  1998  . Cholecystectomy  1982  . Abdominal hysterectomy    . Tubal ligation  1998  . Tonsillectomy  1965  . Vein ligation  1995/1998    right leg  . Dilation and curettage of uterus  2003  . Biopsy thyroid  01/03    suggestive of Hashimoto's  . Gastric bypass    . Thyroidectomy  2008    left  . Fetal surgery for congenital hernia    . Craniectomy suboccipital w/ cervical laminectomy / chiari  05/2010    repair, Dr.Pool    Current Outpatient Prescriptions  Medication Sig Dispense Refill  . cyanocobalamin (,VITAMIN B-12,) 1000 MCG/ML injection Inject 1 mL (1,000 mcg total) into the muscle every 30 (thirty) days. 10 mL 1  . cyclobenzaprine (FLEXERIL) 10 MG tablet TAKE 1 TABLET (10 MG TOTAL) BY MOUTH 2 (TWO) TIMES DAILY AS NEEDED FOR MUSCLE SPASMS. 60 tablet 3  . EPINEPHrine 0.3 mg/0.3 mL IJ SOAJ injection Inject 0.3 mLs (0.3 mg total) into the muscle once. 2 Device 1  . levothyroxine (SYNTHROID, LEVOTHROID) 112 MCG tablet TAKE 1 TABLET (112  MCG TOTAL) BY MOUTH DAILY BEFORE BREAKFAST. 90 tablet 0  . Multiple Vitamins-Iron (DAILY MULTIVITAMINS/IRON PO) Take 1 tablet by mouth daily.     . polyethylene glycol (MIRALAX / GLYCOLAX) packet Take 17 g by mouth daily as needed. For constipation    . SYRINGE-NEEDLE, DISP, 3 ML (B-D 3CC LUER-LOK SYR 25GX1") 25G X 1" 3 ML MISC Use to inject B-12 once monthly dx:E53.8 25 each 0   No current facility-administered medications for this visit.    Allergies  Allergen Reactions  . Tetracyclines & Related Anaphylaxis    syncope  . Latex     REACTION: "anaphylactic shock"  . Ramelteon     REACTION: blacks out  . Zolpidem Tartrate     REACTION: black outs    Social History   Social History    . Marital Status: Divorced    Spouse Name: N/A  . Number of Children: 3  . Years of Education: N/A   Occupational History  . Part time Environmental manager    Social History Main Topics  . Smoking status: Never Smoker   . Smokeless tobacco: Never Used  . Alcohol Use: Yes     Comment: rare  . Drug Use: No  . Sexual Activity: Not on file   Other Topics Concern  . Not on file   Social History Narrative                Family History  Problem Relation Age of Onset  . Arthritis Mother   . Coronary artery disease Father   . Coronary artery disease Sister   . Cancer Maternal Grandmother     Ovarian  . Thyroid disease Neg Hx     Review of Systems:  As stated in the HPI and otherwise negative.   BP 124/82 mmHg  Pulse 60  Wt 305 lb (138.347 kg)  Physical Examination: General: Well developed, well nourished, NAD HEENT: OP clear, mucus membranes moist SKIN: warm, dry. No rashes. Neuro: No focal deficits Musculoskeletal: Muscle strength 5/5 all ext Psychiatric: Mood and affect normal Neck: No JVD, no carotid bruits, no thyromegaly, no lymphadenopathy. Lungs:Clear bilaterally, no wheezes, rhonci, crackles Cardiovascular: Regular rate and rhythm. No murmurs, gallops or rubs. Abdomen:Soft. Bowel sounds present. Non-tender.  Extremities: Trace RLE edema, 1+ left lower extremity edema-unchanged. Pulses are 2 + in the bilateral DP/PT.  Echo 12/12/12: Left ventricle: The cavity size was normal. Wall thickness was normal. Systolic function was normal. The estimated ejection fraction was in the range of 55% to 65%. Wall motion was normal; there were no regional wall motion abnormalities. Doppler parameters are consistent with abnormal left ventricular relaxation (grade 1 diastolic dysfunction). - Left atrium: The atrium was mildly dilated. - Atrial septum: No defect or patent foramen ovale was Identified.  EKG:  EKG is ordered today. The ekg ordered today demonstrates  NSR, rate 60 bpm. Non-specific T wave abn.   Recent Labs: 12/10/2014: TSH 0.06* 03/15/2015: ALT 15; BUN 5.5*; Creatinine 0.6; HGB 12.5; Platelets 202; Potassium 3.8; Sodium 143   Lipid Panel    Component Value Date/Time   CHOL 228* 05/18/2014 0804   TRIG 70.0 05/18/2014 0804   HDL 53.30 05/18/2014 0804   CHOLHDL 4 05/18/2014 0804   VLDL 14.0 05/18/2014 0804   LDLCALC 161* 05/18/2014 0804     Wt Readings from Last 3 Encounters:  09/20/15 305 lb (138.347 kg)  09/04/15 301 lb (136.533 kg)  06/11/15 299 lb (135.626 kg)     Other studies  Reviewed: Additional studies/ records that were reviewed today include: . Review of the above records demonstrates:    Assessment and Plan:   1. Palpitations: No recurrence. No arrythmias noted on 21 day event monitor in 2013.. LV function is normal.  2. LE edema/history of DVT/left lower ext pain: Venous dopplers with chronic, non-occlusive left popliteal vein thrombus but no right lower ext venous thrombus. IVC filter in place since 2004. She has been off of coumadin for years. She does not wish to restart. No acute indication for coumadin.She is followed in Hematology by Dr. Lindi Adie.   3. Morbid obesity: I have counseled her on the importance of weight loss and would recommend that she see a weight loss specialist.   No further cardiac workup at this time.   Current medicines are reviewed at length with the patient today.  The patient does not have concerns regarding medicines.  The following changes have been made:  no change  Labs/ tests ordered today include:   Orders Placed This Encounter  Procedures  . EKG 12-Lead    Disposition:   FU with me in 12  months  Signed, Lauree Chandler, MD 09/20/2015 9:33 AM    [facility] [place], [place], [place]  [address] Phone: [phone]; Fax: [phone]

## 2015-09-20 ENCOUNTER — Ambulatory Visit (INDEPENDENT_AMBULATORY_CARE_PROVIDER_SITE_OTHER): Payer: Medicare Other | Admitting: Cardiovascular Disease

## 2015-09-20 ENCOUNTER — Encounter: Payer: Self-pay | Admitting: Cardiovascular Disease

## 2015-09-20 VITALS — BP 124/82 | HR 60 | Wt 305.0 lb

## 2015-09-20 DIAGNOSIS — R002 Palpitations: Secondary | ICD-10-CM

## 2015-09-20 DIAGNOSIS — I87093 Postthrombotic syndrome with other complications of bilateral lower extremity: Secondary | ICD-10-CM | POA: Diagnosis not present

## 2015-09-20 NOTE — Patient Instructions (Signed)

## 2015-10-08 ENCOUNTER — Encounter: Payer: Self-pay | Admitting: *Deleted

## 2015-10-25 ENCOUNTER — Encounter: Payer: Self-pay | Admitting: Gastroenterology

## 2015-12-14 ENCOUNTER — Other Ambulatory Visit: Payer: Self-pay | Admitting: Endocrinology

## 2015-12-17 ENCOUNTER — Other Ambulatory Visit: Payer: Self-pay

## 2015-12-17 NOTE — Telephone Encounter (Signed)
Pt left v/m requesting refill levothyroxine 112 mcg to [facility]. Pt had been seeing Dr Loanne Drilling; pt was advised by Dr Loanne Drilling he did not need to see pt any more and pt request Dr Silvio Pate to refill levothyroxine 112 mcg. Pt request cb. Pt last seen annual exam on 06/11/15. Last TSH done 12/10/14.Please advise.

## 2015-12-18 MED ORDER — LEVOTHYROXINE SODIUM 112 MCG PO TABS: ORAL_TABLET | ORAL | Status: DC

## 2015-12-18 NOTE — Telephone Encounter (Signed)
Approved: okay to refill for a year 

## 2015-12-18 NOTE — Telephone Encounter (Signed)
rx sent to pharmacy by e-script  

## 2016-01-08 ENCOUNTER — Encounter: Payer: Self-pay | Admitting: Family Medicine

## 2016-01-08 ENCOUNTER — Ambulatory Visit (INDEPENDENT_AMBULATORY_CARE_PROVIDER_SITE_OTHER): Payer: Medicare Other | Admitting: Family Medicine

## 2016-01-08 VITALS — BP 126/86 | HR 72 | Temp 98.2°F | Ht 64.0 in | Wt 309.5 lb

## 2016-01-08 DIAGNOSIS — A084 Viral intestinal infection, unspecified: Secondary | ICD-10-CM

## 2016-01-08 NOTE — Progress Notes (Signed)
Subjective:    Patient ID: [name], female    DOB: 04-07-1961, 55 y.o.   MRN: [id number]  HPI Here for GI symptoms   Last ate chicken parm (at restaurant - son ate the same thing and did not get sick)   Cold and hot -did not take her temp but she thinks she was burning up (no rigors) Body aches  Not sleeping  Diarrhea  Headache  She is drinking like crazy  Cannot eat very much - it makes her run to the bathroom with diarrhea  Some cramping  No blood in stool - is more yellow/orange than average    No n/v  No urinary symptoms  Some body aches    No cough or resp symptoms Had a flu shot this year    ? 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 Viral gastroenteritis 01/08/2016  . UTI (urinary tract infection) 09/04/2015  . Vitamin B 12 deficiency 03/15/2015  . History of iron deficiency 08/29/2014  . Unspecified vitamin D deficiency 08/29/2014  . Left thyroid nodule 07/31/2014  . Routine general medical examination at a health care facility 07/02/2011  . ARNOLD-CHIARI MALFORMATION 09/25/2009  . VENOUS INSUFFICIENCY, CHRONIC 08/27/2009  . MORBID OBESITY 06/26/2008  . SLEEP DISORDER, CHRONIC 03/27/2008  . ALLERGIC RHINITIS 09/02/2007  . Hypothyroidism 02/18/2007  . Iron deficiency anemia 02/18/2007  . ANEMIA-NOS 02/18/2007  . GERD 02/18/2007  . DVT, HX OF 02/18/2007  . COLONIC POLYPS 10/21/2005   Past Medical History  Diagnosis Date  . Anemia   . GERD (gastroesophageal reflux disease)   . Thyroid disease   . Allergy   . Anxiety   . Depression   . History of DVT (deep vein thrombosis)   . Chronic venous insufficiency   . Greenfield filter in place    Past Surgical History  Procedure Laterality Date  . Cesarean section  1998  . Cholecystectomy  1982  . Abdominal hysterectomy    . Tubal ligation  1998  . Tonsillectomy  1965  . Vein ligation   1995/1998    right leg  . Dilation and curettage of uterus  2003  . Biopsy thyroid  01/03    suggestive of Hashimoto's  . Gastric bypass    . Thyroidectomy  2008    left  . Fetal surgery for congenital hernia    . Craniectomy suboccipital w/ cervical laminectomy / chiari  05/2010    repair, Dr.Pool   Social History  Substance Use Topics  . Smoking status: Never Smoker   . Smokeless tobacco: Never Used  . Alcohol Use: 0.0 oz/week    0 Standard drinks or equivalent per week     Comment: rare   Family History  Problem Relation Age of Onset  . Arthritis Mother   . Coronary artery disease Father   . Coronary artery disease Sister   . Cancer Maternal Grandmother     Ovarian  . Thyroid disease Neg Hx    Allergies  Allergen Reactions  . Tetracyclines & Related Anaphylaxis    syncope  . Latex     REACTION: "anaphylactic shock"  . Ramelteon     REACTION: blacks out  . Zolpidem Tartrate     REACTION: black outs   Current Outpatient Prescriptions on File Prior to Visit  Medication Sig Dispense Refill  . cyanocobalamin (,VITAMIN B-12,)  1000 MCG/ML injection Inject 1 mL (1,000 mcg total) into the muscle every 30 (thirty) days. 10 mL 1  . cyclobenzaprine (FLEXERIL) 10 MG tablet TAKE 1 TABLET (10 MG TOTAL) BY MOUTH 2 (TWO) TIMES DAILY AS NEEDED FOR MUSCLE SPASMS. 60 tablet 3  . EPINEPHrine 0.3 mg/0.3 mL IJ SOAJ injection Inject 0.3 mLs (0.3 mg total) into the muscle once. 2 Device 1  . levothyroxine (SYNTHROID, LEVOTHROID) 112 MCG tablet TAKE 1 TABLET (112 MCG TOTAL) BY MOUTH DAILY BEFORE BREAKFAST. 90 tablet 3  . Multiple Vitamins-Iron (DAILY MULTIVITAMINS/IRON PO) Take 1 tablet by mouth daily.     . polyethylene glycol (MIRALAX / GLYCOLAX) packet Take 17 g by mouth daily as needed. For constipation    . SYRINGE-NEEDLE, DISP, 3 ML (B-D 3CC LUER-LOK SYR 25GX1") 25G X 1" 3 ML MISC Use to inject B-12 once monthly dx:E53.8 25 each 0   No current facility-administered medications on  file prior to visit.    Review of Systems Review of Systems  Constitutional: Negative for fever, appetite change, and unexpected weight change.  ENT neg for st or pnd Eyes: Negative for pain and visual disturbance.  Respiratory: Negative for cough and shortness of breath.   Cardiovascular: Negative for cp or palpitations    Gastrointestinal: Negative for nausea, and constipation. neg for blood in stool or black stool , neg for recent abx or hospital visits  Genitourinary: Negative for urgency and frequency.  Skin: Negative for pallor or rash   Neurological: Negative for weakness, light-headedness, numbness and headaches.  Hematological: Negative for adenopathy. Does not bruise/bleed easily.  Psychiatric/Behavioral: Negative for dysphoric mood. The patient is not nervous/anxious.     m    Objective:   Physical Exam  Constitutional: She appears well-developed and well-nourished. No distress.  Morbidly obese and well appearing   HENT:  Head: Normocephalic and atraumatic.  Mouth/Throat: Oropharynx is clear and moist.  Eyes: Conjunctivae and EOM are normal. Pupils are equal, round, and reactive to light. No scleral icterus.  Neck: Normal range of motion. Neck supple.  Cardiovascular: Normal rate, regular rhythm and normal heart sounds.   Pulmonary/Chest: Effort normal and breath sounds normal. No respiratory distress. She has no wheezes. She has no rales.  Abdominal: Soft. Bowel sounds are normal. She exhibits no distension and no mass. There is no hepatosplenomegaly. There is tenderness. There is no rebound, no guarding and no CVA tenderness.  Mild tenderness bilat LQ  No rebound or guarding  Lymphadenopathy:    She has no cervical adenopathy.  Neurological: She is alert.  Skin: Skin is warm and dry. No erythema. No pallor.  Psychiatric: She has a normal mood and affect.          Assessment & Plan:   Problem List Items Addressed This Visit      Digestive   Viral  gastroenteritis - Primary    With reassuring exam  Recommend sips of fluids BRAT diet - to advance slowly as tolerated Rev s/s of dehydration to watch for and seek care  Disc symptomatic care - see instructions on AVS  Update if not starting to improve in a week or if worsening

## 2016-01-08 NOTE — Patient Instructions (Signed)
I think you have a viral stomach bug  Sip fluids constantly  Eat bland (bananas/rice/applesauce/toast/crackers) in small amounts  For fever-you can try extra strength tylenol 2 pills up to every 4 hours  For now - I would avoid much diarrhea medicine - you can try an immodium occasionally - but if it causes abdominal cramping then stop it   Update [place] if no improvement in 2-4 days  Update [place] if symptoms worsen

## 2016-01-08 NOTE — Progress Notes (Signed)
Pre visit review using our clinic review tool, if applicable. No additional management support is needed unless otherwise documented below in the visit note. 

## 2016-01-09 NOTE — Assessment & Plan Note (Signed)
With reassuring exam  Recommend sips of fluids BRAT diet - to advance slowly as tolerated Rev s/s of dehydration to watch for and seek care  Disc symptomatic care - see instructions on AVS  Update if not starting to improve in a week or if worsening

## 2016-01-12 ENCOUNTER — Other Ambulatory Visit: Payer: Self-pay | Admitting: Internal Medicine

## 2016-01-27 ENCOUNTER — Ambulatory Visit (INDEPENDENT_AMBULATORY_CARE_PROVIDER_SITE_OTHER): Payer: Medicare Other | Admitting: Internal Medicine

## 2016-01-27 ENCOUNTER — Encounter: Payer: Self-pay | Admitting: Internal Medicine

## 2016-01-27 VITALS — BP 120/80 | HR 86 | Temp 98.0°F | Wt 308.0 lb

## 2016-01-27 DIAGNOSIS — J029 Acute pharyngitis, unspecified: Secondary | ICD-10-CM | POA: Diagnosis not present

## 2016-01-27 LAB — POCT RAPID STREP A (OFFICE): Rapid Strep A Screen: NEGATIVE

## 2016-01-27 MED ORDER — HYDROCODONE-HOMATROPINE 5-1.5 MG/5ML PO SYRP: 5.0000 mL | ORAL_SOLUTION | Freq: Every evening | ORAL | Status: DC | PRN

## 2016-01-27 NOTE — Progress Notes (Signed)
Pre visit review using our clinic review tool, if applicable. No additional management support is needed unless otherwise documented below in the visit note. 

## 2016-01-27 NOTE — Progress Notes (Signed)
Subjective:    Patient ID: [name], female    DOB: Jan 26, 1961, 55 y.o.   MRN: [id number]  HPI Here due to cough--son came with her  Having chills, severe sore throat,  Can't keep hydrated Drinking a lot but still with dry throat Sick for 2 days Felt hot 2 nights ago--- but no fever measured No sweats Cough is mostly dry---not really productive (rare yellow green color) No PND-- some sneezing with clear mucus. No sig rhinorrhea Left ear pain  Tylenol and tylenol nighttimes mucinex DM--- all not particularly helpful  Current Outpatient Prescriptions on File Prior to Visit  Medication Sig Dispense Refill  . cyanocobalamin (,VITAMIN B-12,) 1000 MCG/ML injection Inject 1 mL (1,000 mcg total) into the muscle every 30 (thirty) days. 10 mL 1  . cyclobenzaprine (FLEXERIL) 10 MG tablet TAKE 1 TABLET (10 MG TOTAL) BY MOUTH 2 (TWO) TIMES DAILY AS NEEDED FOR MUSCLE SPASMS. 60 tablet 0  . EPINEPHrine 0.3 mg/0.3 mL IJ SOAJ injection Inject 0.3 mLs (0.3 mg total) into the muscle once. 2 Device 1  . levothyroxine (SYNTHROID, LEVOTHROID) 112 MCG tablet TAKE 1 TABLET (112 MCG TOTAL) BY MOUTH DAILY BEFORE BREAKFAST. 90 tablet 3  . Multiple Vitamins-Iron (DAILY MULTIVITAMINS/IRON PO) Take 1 tablet by mouth daily.     . polyethylene glycol (MIRALAX / GLYCOLAX) packet Take 17 g by mouth daily as needed. For constipation    . SYRINGE-NEEDLE, DISP, 3 ML (B-D 3CC LUER-LOK SYR 25GX1") 25G X 1" 3 ML MISC Use to inject B-12 once monthly dx:E53.8 25 each 0   No current facility-administered medications on file prior to visit.    Allergies  Allergen Reactions  . Tetracyclines & Related Anaphylaxis    syncope  . Latex     REACTION: "anaphylactic shock"  . Ramelteon     REACTION: blacks out  . Zolpidem Tartrate     REACTION: black outs    Past Medical History  Diagnosis Date  . Anemia   . GERD (gastroesophageal reflux disease)   . Thyroid disease   . Allergy   . Anxiety   . Depression     . History of DVT (deep vein thrombosis)   . Chronic venous insufficiency   . Greenfield filter in place     Past Surgical History  Procedure Laterality Date  . Cesarean section  1998  . Cholecystectomy  1982  . Abdominal hysterectomy    . Tubal ligation  1998  . Tonsillectomy  1965  . Vein ligation  1995/1998    right leg  . Dilation and curettage of uterus  2003  . Biopsy thyroid  01/03    suggestive of Hashimoto's  . Gastric bypass    . Thyroidectomy  2008    left  . Fetal surgery for congenital hernia    . Craniectomy suboccipital w/ cervical laminectomy / chiari  05/2010    repair, Dr.Pool    Family History  Problem Relation Age of Onset  . Arthritis Mother   . Coronary artery disease Father   . Coronary artery disease Sister   . Cancer Maternal Grandmother     Ovarian  . Thyroid disease Neg Hx     Social History   Social History  . Marital Status: Divorced    Spouse Name: N/A  . Number of Children: 3  . Years of Education: N/A   Occupational History  . Part time Environmental manager    Social History Main Topics  . Smoking status:  Never Smoker   . Smokeless tobacco: Never Used  . Alcohol Use: 0.0 oz/week    0 Standard drinks or equivalent per week     Comment: rare  . Drug Use: No  . Sexual Activity: Not on file   Other Topics Concern  . 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 No distress.  HENT:  No sinus tenderness TMs normal Mild nasal congestion Slight pharyngeal injection without enlarged tonsils or exudate  Neck: Normal range of motion. Neck supple. No thyromegaly present.  Pulmonary/Chest: Effort normal and breath sounds normal. No respiratory distress. She has no wheezes. She has no rales.  Lymphadenopathy:    She has no  cervical adenopathy.          Assessment & Plan:

## 2016-01-27 NOTE — Assessment & Plan Note (Addendum)
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

## 2016-01-30 ENCOUNTER — Encounter: Payer: Self-pay | Admitting: *Deleted

## 2016-01-30 ENCOUNTER — Telehealth: Payer: Self-pay | Admitting: Internal Medicine

## 2016-01-30 NOTE — Telephone Encounter (Signed)
Pt called needs a work note for dates this week. She stated she is still not feeling any better And cough med only makes her feel like she has hangover

## 2016-01-30 NOTE — Telephone Encounter (Signed)
Spoke with patient and she has been out all this week and doesn't want to go back to work until Monday. Ok to write note?

## 2016-01-30 NOTE — Telephone Encounter (Signed)
Okay to write note to return to work 2/13

## 2016-01-30 NOTE — Telephone Encounter (Signed)
Spoke with patient and advised results   

## 2016-02-16 ENCOUNTER — Other Ambulatory Visit: Payer: Self-pay | Admitting: Internal Medicine

## 2016-03-04 ENCOUNTER — Telehealth: Payer: Self-pay | Admitting: Internal Medicine

## 2016-03-04 NOTE — Telephone Encounter (Signed)
LM for pt to schedule AWV with Lesia on 06/17/2016 at 9:00am./mn

## 2016-03-18 ENCOUNTER — Other Ambulatory Visit: Payer: Self-pay | Admitting: Internal Medicine

## 2016-03-18 NOTE — Telephone Encounter (Signed)
Approved: #[id number] x 2 

## 2016-03-18 NOTE — Telephone Encounter (Signed)
Last OV 02-17-16 #60/0 Last Routine OV 06-11-15 Next OV 06-17-16

## 2016-04-08 ENCOUNTER — Telehealth: Payer: Self-pay | Admitting: Internal Medicine

## 2016-04-08 NOTE — Telephone Encounter (Signed)
Patient returned [name]'s call.  Patient said she has a medicare wellness scheduled in June with Dr.Letvak and would rather keep the appointment with Dr.Letvak.

## 2016-04-08 NOTE — Telephone Encounter (Signed)
Opened in error

## 2016-06-17 ENCOUNTER — Other Ambulatory Visit: Payer: Self-pay | Admitting: Internal Medicine

## 2016-06-17 ENCOUNTER — Ambulatory Visit: Payer: Medicare Other | Admitting: Internal Medicine

## 2016-06-25 ENCOUNTER — Encounter: Payer: Self-pay | Admitting: Internal Medicine

## 2016-06-25 ENCOUNTER — Ambulatory Visit (INDEPENDENT_AMBULATORY_CARE_PROVIDER_SITE_OTHER)
Admission: RE | Admit: 2016-06-25 | Discharge: 2016-06-25 | Disposition: A | Discharge status: Home / Self Care | Payer: Medicare Other | Source: Ambulatory Visit | Attending: Internal Medicine | Admitting: Internal Medicine

## 2016-06-25 ENCOUNTER — Ambulatory Visit (INDEPENDENT_AMBULATORY_CARE_PROVIDER_SITE_OTHER): Payer: Medicare Other | Admitting: Internal Medicine

## 2016-06-25 VITALS — BP 128/88 | HR 75 | Temp 98.0°F | Wt 314.0 lb

## 2016-06-25 DIAGNOSIS — R079 Chest pain, unspecified: Secondary | ICD-10-CM | POA: Insufficient documentation

## 2016-06-25 MED ORDER — CYCLOBENZAPRINE HCL 10 MG PO TABS: ORAL_TABLET | ORAL | Status: DC

## 2016-06-25 MED ORDER — HYDROCODONE-ACETAMINOPHEN 5-325 MG PO TABS: 1.0000 | ORAL_TABLET | Freq: Four times a day (QID) | ORAL | Status: DC | PRN

## 2016-06-25 MED ORDER — EPINEPHRINE 0.3 MG/0.3ML IJ SOAJ: 0.3000 mg | Freq: Once | INTRAMUSCULAR | Status: DC

## 2016-06-25 NOTE — Patient Instructions (Signed)
Please try heat and 2 aleve twice a day over the next few days.

## 2016-06-25 NOTE — Assessment & Plan Note (Addendum)
Clinically most consistent with rib fracture (despite lack of trauma) or other chest wall injury CXR--is underinflated but no apparent infiltrate or fracture Discussed heat  NSAIDs-- 2 aleve bid norco prn

## 2016-06-25 NOTE — Progress Notes (Signed)
Subjective:    Patient ID: [name], female    DOB: 1961-11-02, 55 y.o.   MRN: [id number]  HPI Here due to pain under right breast  "I have been in so much pain since 4th of July night" Didn't do anything or injure herself Suddenly came on when taking bra off Pain under breast is "searing"--"tearing" Worse with big breath in  Can't lie down Takes her breath away Wheezing some as well--attributed this to weight gain  Pain is intermittent--- worst when lying down or with movement No cough No fever  Aleve and advil may help for very short time  Current Outpatient Prescriptions on File Prior to Visit  Medication Sig Dispense Refill  . cyanocobalamin (,VITAMIN B-12,) 1000 MCG/ML injection Inject 1 mL (1,000 mcg total) into the muscle every 30 (thirty) days. 10 mL 1  . cyclobenzaprine (FLEXERIL) 10 MG tablet TAKE 1 TABLET (10 MG TOTAL) BY MOUTH 2 (TWO) TIMES DAILY AS NEEDED FOR MUSCLE SPASMS. 60 tablet 0  . EPINEPHrine 0.3 mg/0.3 mL IJ SOAJ injection Inject 0.3 mLs (0.3 mg total) into the muscle once. 2 Device 1  . levothyroxine (SYNTHROID, LEVOTHROID) 112 MCG tablet TAKE 1 TABLET (112 MCG TOTAL) BY MOUTH DAILY BEFORE BREAKFAST. 90 tablet 3  . Multiple Vitamins-Iron (DAILY MULTIVITAMINS/IRON PO) Take 1 tablet by mouth daily.     . polyethylene glycol (MIRALAX / GLYCOLAX) packet Take 17 g by mouth daily as needed. For constipation    . SYRINGE-NEEDLE, DISP, 3 ML (B-D 3CC LUER-LOK SYR 25GX1") 25G X 1" 3 ML MISC Use to inject B-12 once monthly dx:E53.8 25 each 0   No current facility-administered medications on file prior to visit.    Allergies  Allergen Reactions  . Tetracyclines & Related Anaphylaxis    syncope  . Latex     REACTION: "anaphylactic shock"  . Ramelteon     REACTION: blacks out  . Zolpidem Tartrate     REACTION: black outs    Past Medical History  Diagnosis Date  . Anemia   . GERD (gastroesophageal reflux disease)   . Thyroid disease   . Allergy     . Anxiety   . Depression   . History of DVT (deep vein thrombosis)   . Chronic venous insufficiency   . Greenfield filter in place     Past Surgical History  Procedure Laterality Date  . Cesarean section  1998  . Cholecystectomy  1982  . Abdominal hysterectomy    . Tubal ligation  1998  . Tonsillectomy  1965  . Vein ligation  1995/1998    right leg  . Dilation and curettage of uterus  2003  . Biopsy thyroid  01/03    suggestive of Hashimoto's  . Gastric bypass    . Thyroidectomy  2008    left  . Fetal surgery for congenital hernia    . Craniectomy suboccipital w/ cervical laminectomy / chiari  05/2010    repair, Dr.Pool    Family History  Problem Relation Age of Onset  . Arthritis Mother   . Coronary artery disease Father   . Coronary artery disease Sister   . Cancer Maternal Grandmother     Ovarian  . Thyroid disease Neg Hx     Social History   Social History  . Marital Status: Divorced    Spouse Name: N/A  . Number of Children: 3  . Years of Education: N/A   Occupational History  . Part time Environmental manager  Social History Main Topics  . Smoking status: Never Smoker   . Smokeless tobacco: Never Used  . Alcohol Use: 0.0 oz/week    0 Standard drinks or equivalent per week     Comment: rare  . Drug Use: No  . Sexual Activity: Not on file   Other Topics Concern  . Not on file   Social History Narrative               Review of Systems  Appetite is okay Some nausea without vomiting Bowels are slow--no change  No urinary problems     Objective:   Physical Exam  Constitutional: No distress.  Neck: No thyromegaly present.  Cardiovascular: Normal rate and normal heart sounds.  Exam reveals no gallop and no friction rub.   No murmur heard. Pulmonary/Chest: Effort normal. No respiratory distress. She has no wheezes. She has no rales.  Decreased breath sounds but clear Tenderness along ribs on right--anterior axillary line ~T7-9   Lymphadenopathy:    She has no cervical adenopathy.  Psychiatric: She has a normal mood and affect. Her behavior is normal.          Assessment & Plan:

## 2016-06-25 NOTE — Progress Notes (Signed)
Pre visit review using our clinic review tool, if applicable. No additional management support is needed unless otherwise documented below in the visit note. 

## 2016-08-11 ENCOUNTER — Ambulatory Visit: Payer: Medicare Other | Admitting: Internal Medicine

## 2016-08-21 ENCOUNTER — Other Ambulatory Visit: Payer: Self-pay | Admitting: Internal Medicine

## 2016-08-25 NOTE — Telephone Encounter (Signed)
Last filled 07-20-16 #60 Last OV 06-25-16 Next OV 12-28-16

## 2016-08-25 NOTE — Telephone Encounter (Signed)
Approved: #60 x 2

## 2016-09-23 ENCOUNTER — Emergency Department (HOSPITAL_COMMUNITY): Payer: Medicare Other

## 2016-09-23 ENCOUNTER — Encounter (HOSPITAL_COMMUNITY): Payer: Self-pay

## 2016-09-23 ENCOUNTER — Emergency Department (HOSPITAL_COMMUNITY)
Admission: EM | Admit: 2016-09-23 | Discharge: 2016-09-23 | Disposition: A | Discharge status: Home / Self Care | Payer: Medicare Other | Attending: Emergency Medicine | Admitting: Emergency Medicine

## 2016-09-23 ENCOUNTER — Telehealth: Payer: Self-pay | Admitting: Internal Medicine

## 2016-09-23 ENCOUNTER — Emergency Department (HOSPITAL_BASED_OUTPATIENT_CLINIC_OR_DEPARTMENT_OTHER): Admit: 2016-09-23 | Discharge: 2016-09-23 | Disposition: A | Discharge status: Home / Self Care | Payer: Medicare Other

## 2016-09-23 DIAGNOSIS — Z91040 Latex allergy status: Secondary | ICD-10-CM | POA: Diagnosis not present

## 2016-09-23 DIAGNOSIS — E039 Hypothyroidism, unspecified: Secondary | ICD-10-CM | POA: Insufficient documentation

## 2016-09-23 DIAGNOSIS — M7989 Other specified soft tissue disorders: Secondary | ICD-10-CM | POA: Diagnosis not present

## 2016-09-23 DIAGNOSIS — R0789 Other chest pain: Secondary | ICD-10-CM | POA: Diagnosis not present

## 2016-09-23 DIAGNOSIS — Z86718 Personal history of other venous thrombosis and embolism: Secondary | ICD-10-CM | POA: Diagnosis not present

## 2016-09-23 DIAGNOSIS — R0602 Shortness of breath: Secondary | ICD-10-CM | POA: Diagnosis not present

## 2016-09-23 DIAGNOSIS — R079 Chest pain, unspecified: Secondary | ICD-10-CM

## 2016-09-23 LAB — BASIC METABOLIC PANEL
Anion gap: 9 (ref 5–15)
BUN: <5 mg/dL — ABNORMAL LOW (ref 6–20)
CO2: 25 mmol/L (ref 22–32)
Calcium: 8.5 mg/dL — ABNORMAL LOW (ref 8.9–10.3)
Chloride: 107 mmol/L (ref 101–111)
Creatinine, Ser: 0.55 mg/dL (ref 0.44–1.00)
GFR calc Af Amer: >60 mL/min (ref >60)
GFR calc non Af Amer: >60 mL/min (ref >60)
Glucose, Bld: 99 mg/dL (ref 65–99)
Potassium: 3.6 mmol/L (ref 3.5–5.1)
Sodium: 141 mmol/L (ref 135–145)

## 2016-09-23 LAB — CBC
HCT: 40.9 % (ref 36.0–46.0)
Hemoglobin: 13.1 g/dL (ref 12.0–15.0)
MCH: 30.8 pg (ref 26.0–34.0)
MCHC: 32 g/dL (ref 30.0–36.0)
MCV: 96.2 fL (ref 78.0–100.0)
Platelets: 217 10*3/uL (ref 150–400)
RBC: 4.25 MIL/uL (ref 3.87–5.11)
RDW: 13.5 % (ref 11.5–15.5)
WBC: 6.7 10*3/uL (ref 4.0–10.5)

## 2016-09-23 LAB — I-STAT TROPONIN, ED: Troponin i, poc: 0 ng/mL (ref 0.00–0.08)

## 2016-09-23 LAB — D-DIMER, QUANTITATIVE (NOT AT ARMC): D-Dimer, Quant: 0.69 ug/mL-FEU — ABNORMAL HIGH (ref 0.00–0.50)

## 2016-09-23 MED ORDER — IOPAMIDOL (ISOVUE-370) INJECTION 76%
INTRAVENOUS | Status: AC
  Administered 2016-09-23: 100 mL
  Filled 2016-09-23: qty 100

## 2016-09-23 NOTE — Telephone Encounter (Signed)
Will review ER evaluation after it happens

## 2016-09-23 NOTE — Telephone Encounter (Signed)
I spoke with pt and lt leg has been swollen and swelling does not go down after legs up during the night. Pt says her foot hurts to touch it when putting on socks and shoes.pt does have multiple bruising with no injury and wheezing and SOB on and off. Pt said she will go to [facility] ED now to be evaluated. Pt has DVT with filter. FYI to Dr Silvio Pate.

## 2016-09-23 NOTE — ED Triage Notes (Signed)
Pt presents with 2 week h/o increasing shortness of breath with chest pain starting yesterday.  Pt reports bruising to legs that she first noted this morning.  Pt reports h/o PE/DVT with filter in place.

## 2016-09-23 NOTE — ED Provider Notes (Signed)
[facility] DEPT Provider Note   CSN: [id number] Arrival date & time: 09/23/16  1133   By signing my name below, I, Evelene Croon, attest that this documentation has been prepared under the direction and in the presence of Virgel Manifold, MD . Electronically Signed: Evelene Croon, Scribe. 09/23/2016. 1:10 PM.   History   Chief Complaint Chief Complaint  Patient presents with  . Chest Pain    The history is provided by the patient. No language interpreter was used.     HPI Comments:  [name] is a 55 y.o. female with a history of DVT filter and greenfield filter who presents to the Emergency Department complaining of atraumatic bruising to the LLE x 2 weeks ago with associated swelling. She also notes SOB and chest tightness/heaviness. She called her PCP this AM and was advised to come the ED for further evaluation. Pt notes in the past she has been diagnosed with a DVT either while pregnant or after a surgery; no recent surgery or injury.  She denies cough, fever, blood in stool, and melena. Pt is not currently on blood thinners. No alleviating factors noted.   Mild discoloration to left great toenail  Ecchymosis to the medial aspect of left knee  No calf tenderness  Past Medical History:  Diagnosis Date  . Allergy   . Anemia   . Anxiety   . Chronic venous insufficiency   . Depression   . GERD (gastroesophageal reflux disease)   . Greenfield filter in place   . History of DVT (deep vein thrombosis)   . Thyroid disease     Patient Active Problem List   Diagnosis Date Noted  . Right-sided chest pain 06/25/2016  . Viral gastroenteritis 01/08/2016  . UTI (urinary tract infection) 09/04/2015  . Vitamin B 12 deficiency 03/15/2015  . History of iron deficiency 08/29/2014  . Unspecified vitamin D deficiency 08/29/2014  . Left thyroid nodule 07/31/2014  . Routine general medical examination at a health care facility 07/02/2011  . ARNOLD-CHIARI MALFORMATION  09/25/2009  . VENOUS INSUFFICIENCY, CHRONIC 08/27/2009  . MORBID OBESITY 06/26/2008  . SLEEP DISORDER, CHRONIC 03/27/2008  . ALLERGIC RHINITIS 09/02/2007  . Hypothyroidism 02/18/2007  . Iron deficiency anemia 02/18/2007  . ANEMIA-NOS 02/18/2007  . GERD 02/18/2007  . DVT, HX OF 02/18/2007  . COLONIC POLYPS 10/21/2005    Past Surgical History:  Procedure Laterality Date  . ABDOMINAL HYSTERECTOMY    . BIOPSY THYROID  01/03   suggestive of Hashimoto's  . CESAREAN SECTION  1998  . CHOLECYSTECTOMY  1982  . CRANIECTOMY SUBOCCIPITAL W/ CERVICAL LAMINECTOMY / CHIARI  05/2010   repair, Dr.Pool  . DILATION AND CURETTAGE OF UTERUS  2003  . FETAL SURGERY FOR CONGENITAL HERNIA    . GASTRIC BYPASS    . THYROIDECTOMY  2008   left  . TONSILLECTOMY  1965  . TUBAL LIGATION  1998  . VEIN LIGATION  1995/1998   right leg    OB History    No data available       Home Medications    Prior to Admission medications   Medication Sig Start Date End Date Taking? Authorizing Provider  cyanocobalamin (,VITAMIN B-12,) 1000 MCG/ML injection Inject 1 mL (1,000 mcg total) into the muscle every 30 (thirty) days. 06/11/15   [name], MD  cyclobenzaprine (FLEXERIL) 10 MG tablet TAKE 1 TABLET (10 MG TOTAL) BY MOUTH 2 (TWO) TIMES DAILY AS NEEDED FOR MUSCLE SPASMS. 08/25/16   [name], MD  EPINEPHrine 0.3 mg/0.3 mL IJ SOAJ injection Inject 0.3 mLs (0.3 mg total) into the muscle once. 06/25/16   [name], MD  HYDROcodone-acetaminophen (NORCO/VICODIN) 5-325 MG tablet Take 1 tablet by mouth every 6 (six) hours as needed for moderate pain. 06/25/16   [name], MD  levothyroxine (SYNTHROID, LEVOTHROID) 112 MCG tablet TAKE 1 TABLET (112 MCG TOTAL) BY MOUTH DAILY BEFORE BREAKFAST. 12/18/15   [name], MD  Multiple Vitamins-Iron (DAILY MULTIVITAMINS/IRON PO) Take 1 tablet by mouth daily.     Historical Provider, MD  polyethylene glycol (MIRALAX / GLYCOLAX) packet Take 17 g by mouth  daily as needed. For constipation    Historical Provider, MD  SYRINGE-NEEDLE, DISP, 3 ML (B-D 3CC LUER-LOK SYR 25GX1") 25G X 1" 3 ML MISC Use to inject B-12 once monthly dx:E53.8 06/19/15   [name], MD    Family History Family History  Problem Relation Age of Onset  . Arthritis Mother   . Coronary artery disease Father   . Coronary artery disease Sister   . Cancer Maternal Grandmother     Ovarian  . Thyroid disease Neg Hx     Social History Social History  Substance Use Topics  . Smoking status: Never Smoker  . Smokeless tobacco: Never Used  . Alcohol use 0.0 oz/week     Comment: rare     Allergies   Tetracyclines & related; Latex; Ramelteon; and Zolpidem tartrate   Review of Systems Review of Systems  Constitutional: Negative for chills and fever.  Respiratory: Positive for chest tightness (heaviness) and shortness of breath. Negative for cough.   Cardiovascular: Positive for leg swelling.  Gastrointestinal: Negative for blood in stool.  Skin: Positive for color change (bruising).  All other systems reviewed and are negative.   Physical Exam Updated Vital Signs BP 126/74   Pulse 73   Temp 97.3 F (36.3 C) (Oral)   Resp 20   Ht 5\' 3"  (1.6 m)   Wt (!) 306 lb (138.8 kg)   SpO2 100%   BMI 54.21 kg/m   Physical Exam  Constitutional: She is oriented to person, place, and time. She appears well-developed and well-nourished. No distress.  HENT:  Head: Normocephalic and atraumatic.  Eyes: EOM are normal.  Neck: Normal range of motion.  Cardiovascular: Normal rate, regular rhythm and normal heart sounds.   Pulmonary/Chest: Effort normal and breath sounds normal.  Abdominal: Soft. She exhibits no distension. There is no tenderness.  Musculoskeletal: Normal range of motion.  Neurological: She is alert and oriented to person, place, and time.  Skin: Skin is warm and dry.  Psychiatric: She has a normal mood and affect. Judgment normal.  Nursing note and  vitals reviewed.    ED Treatments / Results  DIAGNOSTIC STUDIES:  Oxygen Saturation is 100% on RA, normal by my interpretation.    COORDINATION OF CARE:  1:23 PM Discussed treatment plan with pt at bedside and pt agreed to plan.  Labs (all labs ordered are listed, but only abnormal results are displayed) Labs Reviewed  BASIC METABOLIC PANEL - Abnormal; Notable for the following:       Result Value   BUN <5 (*)    Calcium 8.5 (*)    All other components within normal limits  D-DIMER, QUANTITATIVE (NOT AT [facility]) - Abnormal; Notable for the following:    D-Dimer, Quant 0.69 (*)    All other components within normal limits  CBC  I-STAT TROPOININ, ED    EKG  EKG Interpretation  Date/Time:  Wednesday September 23 2016 11:38:24 EDT Ventricular Rate:  71 PR Interval:  168 QRS Duration: 82 QT Interval:  426 QTC Calculation: 462 R Axis:   12 Text Interpretation:  Normal sinus rhythm Low voltage QRS Cannot rule out Anterior infarct , age undetermined Abnormal ECG No significant change since last tracing Confirmed by Wilson Singer  MD, Kristianne Albin [phone]) on 09/23/2016 1:14:19 PM       Radiology No results found.   Dg Chest 2 View  Result Date: 09/23/2016 CLINICAL DATA:  Bilateral leg swelling EXAM: CHEST  2 VIEW COMPARISON:  06/25/2016 FINDINGS: The heart size and mediastinal contours are within normal limits. Both lungs are clear. The visualized skeletal structures are unremarkable. IMPRESSION: No active cardiopulmonary disease. Electronically Signed   By: Kathreen Devoid   On: 09/23/2016 13:15   Ct Angio Chest Pe W And/or Wo Contrast  Result Date: 09/23/2016 CLINICAL DATA:  Shortness of breath of breath for 2 weeks and started having chest pain this morning history of DVT EXAM: CT ANGIOGRAPHY CHEST WITH CONTRAST TECHNIQUE: Multidetector CT imaging of the chest was performed using the standard protocol during bolus administration of intravenous contrast. Multiplanar CT image reconstructions  and MIPs were obtained to evaluate the vascular anatomy. CONTRAST:  100 mL of Isovue 370 intravenous contrast COMPARISON:  Current chest radiograph.  Chest CT, 08/18/2007 FINDINGS: Cardiovascular: There is no evidence of a pulmonary embolism. The heart is normal in size and configuration. There are no coronary artery calcifications. Great vessels normal in caliber. No aortic dissection or plaque. Mediastinum/Nodes: There changes from previous thyroid surgery. No thyroid or neck base masses. No mediastinal or hilar masses or adenopathy. Lungs/Pleura: Lungs are clear. No pleural effusion or pneumothorax. Upper Abdomen: Small sliding hernia. There are stable changes rectum there changes from fraction there stable changes from gastric surgery. No acute findings. Musculoskeletal: No chest wall abnormality. No acute or significant osseous findings. Review of the MIP images confirms the above findings. IMPRESSION: 1. No evidence of a pulmonary embolism. 2. No acute findings.  Clear lungs. Electronically Signed   By: Lajean Manes M.D.   On: 09/23/2016 16:09    Procedures Procedures (including critical care time)  Medications Ordered in ED Medications - No data to display   Initial Impression / Assessment and Plan / ED Course  I have reviewed the triage vital signs and the nursing notes.  Pertinent labs & imaging results that were available during my care of the patient were reviewed by me and considered in my medical decision making (see chart for details).  Clinical Course    55yF with CP. Doubt ACS, PE, dissection or other emergent pathology. It has been determined that no acute conditions requiring further emergency intervention are present at this time. The patient has been advised of the diagnosis and plan. I reviewed any labs and imaging including any potential incidental findings. We have discussed signs and symptoms that warrant return to the ED and they are listed in the discharge instructions.      Final Clinical Impressions(s) / ED Diagnoses   Final diagnoses:  Nonspecific chest pain    New Prescriptions New Prescriptions   No medications on file   I personally preformed the services scribed in my presence. The recorded information has been reviewed is accurate. Virgel Manifold, MD.     Virgel Manifold, MD 09/27/16 [phone]

## 2016-09-23 NOTE — Telephone Encounter (Signed)
Patient Name: [name]  DOB: [birth year]    Initial Comment Caller states c/o leg swelling, unexplained bruising and toenail looks like something has been dropped on it. Patient has a green fill filter and a history of DVT's. 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    Nurse Assessment  Nurse: Raphael Gibney, RN, Vanita Ingles Date/Time (Eastern Time): 09/23/2016 8:35:35 AM  Confirm and document reason for call. If symptomatic, describe symptoms. You must click the next button to save text entered. ---Caller states her left leg is swollen and has multiple bruising. No injury. Has bruise on the inside of her left knee that is golf ball sized and raised. She has greenfield filter in that leg She has wheezing with activity.  Has the patient traveled out of the country within the last 30 days? ---Not Applicable  Does the patient have any new or worsening symptoms? ---Yes  Will a triage be completed? ---Yes  Related visit to physician within the last 2 weeks? ---No  Does the PT have any chronic conditions? (i.e. diabetes, asthma, etc.) ---Yes  List chronic conditions. ---DVT; thyroid disease  Is this a behavioral health or substance abuse call? ---No     Guidelines    Guideline Title Affirmed Question Affirmed Notes  Leg Swelling and Edema [1] Difficulty breathing with exertion (e.g., walking) AND [2] new onset or worsening    Final Disposition User   Go to ED Now (or PCP triage) Raphael Gibney, RN, Vera    Comments  called primary number and left message. Will try secondary number  Pt does not want to go to the ER but would like a doppler scheduled.  Called back line and spoke to [place] and gave report that pt has left leg swelling with multiple bruising and has a golf sized raised bruise on the inside of her left knee. has wheezing with activity with ER outcome (or PCP triage). Pt does not want to go to the ER but wants doppler scheduled.   Referrals  GO TO  FACILITY REFUSED   Disagree/Comply: Comply

## 2016-09-23 NOTE — Progress Notes (Signed)
Preliminary results by tech - No evidence of acute deep vein or superficial vein thrombosis bilateral lower extremities. Oda Cogan, BS, RDMS, RVT

## 2016-09-29 ENCOUNTER — Ambulatory Visit (INDEPENDENT_AMBULATORY_CARE_PROVIDER_SITE_OTHER): Payer: Medicare Other | Admitting: Primary Care

## 2016-09-29 ENCOUNTER — Encounter: Payer: Self-pay | Admitting: Primary Care

## 2016-09-29 VITALS — BP 124/76 | HR 67 | Temp 98.2°F | Ht 64.0 in | Wt 315.1 lb

## 2016-09-29 DIAGNOSIS — R319 Hematuria, unspecified: Secondary | ICD-10-CM | POA: Diagnosis not present

## 2016-09-29 DIAGNOSIS — N390 Urinary tract infection, site not specified: Secondary | ICD-10-CM | POA: Diagnosis not present

## 2016-09-29 DIAGNOSIS — Z23 Encounter for immunization: Secondary | ICD-10-CM | POA: Diagnosis not present

## 2016-09-29 LAB — POC URINALSYSI DIPSTICK (AUTOMATED)
Bilirubin, UA: NEGATIVE
Glucose, UA: NEGATIVE
KETONES UA: NEGATIVE
NITRITE UA: NEGATIVE
Spec Grav, UA: >=1.03
UROBILINOGEN UA: NEGATIVE
pH, UA: 6

## 2016-09-29 MED ORDER — SULFAMETHOXAZOLE-TRIMETHOPRIM 800-160 MG PO TABS: 1.0000 | ORAL_TABLET | Freq: Two times a day (BID) | ORAL | 0 refills | Status: DC

## 2016-09-29 NOTE — Progress Notes (Signed)
Subjective:    Patient ID: [name], female    DOB: 1961/10/25, 55 y.o.   MRN: [id number]  HPI  [name] is a 55 year old female who presents today with a chief complaint of urinary frequency. [name] also reports foul smelling odor to her urine, dysuria, and pelvic pressure. Denies fevers, hematuria, vaginal discharge, vaginal itching. Her symptoms began 4-5 days ago after [name] completed a CT scan with contrast, but over the last 2 days her symptoms have progressed. [name]'s not taken anything OTC for her symptoms. [name] does have a history of UTI last year.  Review of Systems  Constitutional: Negative for chills and fever.  Gastrointestinal: Negative for abdominal pain.  Genitourinary: Positive for dysuria and frequency. Negative for flank pain, hematuria and vaginal discharge.       Pelvic pressure, foul smelling urine       Past Medical History:  Diagnosis Date  . Allergy   . Anemia   . Anxiety   . Chronic venous insufficiency   . Depression   . GERD (gastroesophageal reflux disease)   . Greenfield filter in place   . History of DVT (deep vein thrombosis)   . Thyroid disease      Social History   Social History  . Marital status: Divorced    Spouse name: N/A  . Number of children: 3  . Years of education: N/A   Occupational History  . Part time Environmental manager    Social History Main Topics  . Smoking status: Never Smoker  . Smokeless tobacco: Never Used  . Alcohol use 0.0 oz/week     Comment: rare  . Drug use: No  . Sexual activity: Not on file   Other Topics Concern  . Not on file   Social History Narrative                Past Surgical History:  Procedure Laterality Date  . ABDOMINAL HYSTERECTOMY    . BIOPSY THYROID  01/03   suggestive of Hashimoto's  . CESAREAN SECTION  1998  . CHOLECYSTECTOMY  1982  . CRANIECTOMY SUBOCCIPITAL W/ CERVICAL LAMINECTOMY / CHIARI  05/2010   repair, Dr.Pool  . DILATION AND CURETTAGE OF UTERUS  2003  . FETAL  SURGERY FOR CONGENITAL HERNIA    . GASTRIC BYPASS    . THYROIDECTOMY  2008   left  . TONSILLECTOMY  1965  . TUBAL LIGATION  1998  . VEIN LIGATION  1995/1998   right leg    Family History  Problem Relation Age of Onset  . Arthritis Mother   . Coronary artery disease Father   . Coronary artery disease Sister   . Cancer Maternal Grandmother     Ovarian  . Thyroid disease Neg Hx     Allergies  Allergen Reactions  . Latex Other (See Comments)    REACTION: "anaphylactic shock"  . Ramelteon Other (See Comments)    REACTION: blacks out  . Tetracyclines & Related Anaphylaxis and Other (See Comments)    syncope  . Zolpidem Tartrate Other (See Comments)    REACTION: black outs    Current Outpatient Prescriptions on File Prior to Visit  Medication Sig Dispense Refill  . cyanocobalamin (,VITAMIN B-12,) 1000 MCG/ML injection Inject 1 mL (1,000 mcg total) into the muscle every 30 (thirty) days. 10 mL 1  . cyclobenzaprine (FLEXERIL) 10 MG tablet TAKE 1 TABLET (10 MG TOTAL) BY MOUTH 2 (TWO) TIMES DAILY AS NEEDED FOR MUSCLE SPASMS. (Patient taking  differently: Take 20 mg by mouth at bedtime) 60 tablet 2  . diphenhydramine-acetaminophen (TYLENOL PM) 25-500 MG TABS tablet Take 2 tablets by mouth at bedtime.    Marland Kitchen EPINEPHrine 0.3 mg/0.3 mL IJ SOAJ injection Inject 0.3 mLs (0.3 mg total) into the muscle once. 2 Device 1  . levothyroxine (SYNTHROID, LEVOTHROID) 112 MCG tablet TAKE 1 TABLET (112 MCG TOTAL) BY MOUTH DAILY BEFORE BREAKFAST. (Patient taking differently: Take 112 mcg by mouth at bedtime. ) 90 tablet 3  . Multiple Vitamins-Iron (DAILY MULTIVITAMINS/IRON PO) Take 1 tablet by mouth daily.     . polyethylene glycol (MIRALAX / GLYCOLAX) packet Take 17 g by mouth daily as needed for mild constipation.     . SYRINGE-NEEDLE, DISP, 3 ML (B-D 3CC LUER-LOK SYR 25GX1") 25G X 1" 3 ML MISC Use to inject B-12 once monthly dx:E53.8 25 each 0  . HYDROcodone-acetaminophen (NORCO/VICODIN) 5-325 MG tablet  Take 1 tablet by mouth every 6 (six) hours as needed for moderate pain. (Patient not taking: Reported on 09/29/2016) 20 tablet 0   No current facility-administered medications on file prior to visit.     BP 124/76   Pulse 67   Temp 98.2 F (36.8 C) (Oral)   Ht 5\' 4"  (1.626 m)   Wt (!) 315 lb 1.9 oz (142.9 kg)   SpO2 97%   BMI 54.09 kg/m    Objective:   Physical Exam  Constitutional: [name] appears well-nourished. [name] does not appear ill.  Cardiovascular: Normal rate and regular rhythm.   Pulmonary/Chest: Effort normal and breath sounds normal.  Abdominal: Soft. There is no tenderness. There is no CVA tenderness.  Skin: Skin is warm and dry.          Assessment & Plan:  Urinary Tract Infection:  Dysuria, frequency, foul smelling urine x 4-5 days, worse over past 2 days. UA: 3+ Leuks, negative nitrites, 2+ blood. Culture sent. No CVA tenderness or fevers, low suspicion for acute pyelonephritis.  Rx for Bactrim DS tablets sent to pharmacy. Discussed use of AZO. Fluids, rest, follow up PRN.  Sheral Flow, NP

## 2016-09-29 NOTE — Patient Instructions (Signed)
Your urine was positive for infection.  Start Bactrim DS (sulfamethoxazole/trimethoprim) tablets for urinary tract infection. Take 1 tablet by mouth twice daily for 5 days.  Push intake of water. You may take AZO as needed for symptoms of pelvic pressure, burning, and frequency.  Please notify me if no improvement in 3-4 days.  It was a pleasure meeting you!   Urinary Tract Infection Urinary tract infections (UTIs) can develop anywhere along your urinary tract. Your urinary tract is your body's drainage system for removing wastes and extra water. Your urinary tract includes two kidneys, two ureters, a bladder, and a urethra. Your kidneys are a pair of bean-shaped organs. Each kidney is about the size of your fist. They are located below your ribs, one on each side of your spine. CAUSES Infections are caused by microbes, which are microscopic organisms, including fungi, viruses, and bacteria. These organisms are so small that they can only be seen through a microscope. Bacteria are the microbes that most commonly cause UTIs. SYMPTOMS  Symptoms of UTIs may vary by age and gender of the patient and by the location of the infection. Symptoms in young women typically include a frequent and intense urge to urinate and a painful, burning feeling in the bladder or urethra during urination. Older women and men are more likely to be tired, shaky, and weak and have muscle aches and abdominal pain. A fever may mean the infection is in your kidneys. Other symptoms of a kidney infection include pain in your back or sides below the ribs, nausea, and vomiting. DIAGNOSIS To diagnose a UTI, your caregiver will ask you about your symptoms. Your caregiver will also ask you to provide a urine sample. The urine sample will be tested for bacteria and white blood cells. White blood cells are made by your body to help fight infection. TREATMENT  Typically, UTIs can be treated with medication. Because most UTIs are caused  by a bacterial infection, they usually can be treated with the use of antibiotics. The choice of antibiotic and length of treatment depend on your symptoms and the type of bacteria causing your infection. HOME CARE INSTRUCTIONS  If you were prescribed antibiotics, take them exactly as your caregiver instructs you. Finish the medication even if you feel better after you have only taken some of the medication.  Drink enough water and fluids to keep your urine clear or pale yellow.  Avoid caffeine, tea, and carbonated beverages. They tend to irritate your bladder.  Empty your bladder often. Avoid holding urine for long periods of time.  Empty your bladder before and after sexual intercourse.  After a bowel movement, women should cleanse from front to back. Use each tissue only once. SEEK MEDICAL CARE IF:   You have back pain.  You develop a fever.  Your symptoms do not begin to resolve within 3 days. SEEK IMMEDIATE MEDICAL CARE IF:   You have severe back pain or lower abdominal pain.  You develop chills.  You have nausea or vomiting.  You have continued burning or discomfort with urination. MAKE SURE YOU:   Understand these instructions.  Will watch your condition.  Will get help right away if you are not doing well or get worse.   This information is not intended to replace advice given to you by your health care provider. Make sure you discuss any questions you have with your health care provider.   Document Released: 09/16/2005 Document Revised: 08/28/2015 Document Reviewed: 01/15/2012 Elsevier Interactive Patient Education  2016 [employer].

## 2016-09-29 NOTE — Addendum Note (Signed)
Addended by: Jacqualin Combes on: 09/29/2016 05:06 PM   Modules accepted: Orders

## 2016-09-29 NOTE — Progress Notes (Signed)
Pre visit review using our clinic review tool, if applicable. No additional management support is needed unless otherwise documented below in the visit note. 

## 2016-09-30 NOTE — Addendum Note (Signed)
Addended by: Jacqualin Combes on: 09/30/2016 08:00 AM   Modules accepted: Orders

## 2016-10-01 LAB — URINE CULTURE

## 2016-11-15 ENCOUNTER — Other Ambulatory Visit: Payer: Self-pay | Admitting: Internal Medicine

## 2016-11-16 NOTE — Telephone Encounter (Signed)
Approved: #60 x 2

## 2016-11-16 NOTE — Telephone Encounter (Signed)
Last filled 10-17-16 #60 Last OV 09-29-16 Next OV 01-07-17

## 2016-12-08 ENCOUNTER — Other Ambulatory Visit: Payer: Self-pay | Admitting: Internal Medicine

## 2016-12-24 ENCOUNTER — Ambulatory Visit: Payer: Medicare Other | Admitting: Cardiovascular Disease

## 2016-12-24 NOTE — Progress Notes (Deleted)
No chief complaint on file.   History of Present Illness: 56 yo WF with history of palpitations, DVT both legs, first in 1996 during trauma, second in 1997 after vein ligation as well as chronic lower extremity edema who is here today for follow up. She had been on coumadin but this was stopped in 2008 by Dr. Albertine Patricia because she was non-compliant and there was no evidence of hypercoagulable state. She has had a negative workup within the last 5 years in hematology by Dr. Humphrey Rolls. She has also been seen in the past by Dr. Eilleen Kempf in [facility] for vein stripping. She had prior vein stripping in [place]. Most recent LE venous dopplers October 2017 with no evidence of DVT. Chest CTA October 2017 with no evidence of PE. She has a history of palpitaiotns. Cardiac monitor 2013 with sinus rhythm. Echo December 2013 with normal LV size and function, grade 1 diastolic dysfunction, mildly dilated left atrium.   She is here today for follow up. She is doing well overall. She has no change in her LE swelling. No palpitations.   Primary Care Physician: [name], MD   Last Lipid Profile:  Past Medical History:  Diagnosis Date  . Allergy   . Anemia   . Anxiety   . Chronic venous insufficiency   . Depression   . GERD (gastroesophageal reflux disease)   . Greenfield filter in place   . History of DVT (deep vein thrombosis)   . Thyroid disease     Past Surgical History:  Procedure Laterality Date  . ABDOMINAL HYSTERECTOMY    . BIOPSY THYROID  01/03   suggestive of Hashimoto's  . CESAREAN SECTION  1998  . CHOLECYSTECTOMY  1982  . CRANIECTOMY SUBOCCIPITAL W/ CERVICAL LAMINECTOMY / CHIARI  05/2010   repair, Dr.Pool  . DILATION AND CURETTAGE OF UTERUS  2003  . FETAL SURGERY FOR CONGENITAL HERNIA    . GASTRIC BYPASS    . THYROIDECTOMY  2008   left  . TONSILLECTOMY  1965  . TUBAL LIGATION  1998  . VEIN LIGATION  1995/1998   right leg    Current Outpatient Prescriptions    Medication Sig Dispense Refill  . cyanocobalamin (,VITAMIN B-12,) 1000 MCG/ML injection Inject 1 mL (1,000 mcg total) into the muscle every 30 (thirty) days. 10 mL 1  . cyclobenzaprine (FLEXERIL) 10 MG tablet TAKE 1 TABLET (10 MG TOTAL) BY MOUTH 2 (TWO) TIMES DAILY AS NEEDED FOR MUSCLE SPASMS. 60 tablet 2  . diphenhydramine-acetaminophen (TYLENOL PM) 25-500 MG TABS tablet Take 2 tablets by mouth at bedtime.    Marland Kitchen EPINEPHrine 0.3 mg/0.3 mL IJ SOAJ injection Inject 0.3 mLs (0.3 mg total) into the muscle once. 2 Device 1  . HYDROcodone-acetaminophen (NORCO/VICODIN) 5-325 MG tablet Take 1 tablet by mouth every 6 (six) hours as needed for moderate pain. (Patient not taking: Reported on 09/29/2016) 20 tablet 0  . levothyroxine (SYNTHROID, LEVOTHROID) 112 MCG tablet TAKE 1 TABLET (112 MCG TOTAL) BY MOUTH DAILY BEFORE BREAKFAST. 90 tablet 0  . Multiple Vitamins-Iron (DAILY MULTIVITAMINS/IRON PO) Take 1 tablet by mouth daily.     . polyethylene glycol (MIRALAX / GLYCOLAX) packet Take 17 g by mouth daily as needed for mild constipation.     . sulfamethoxazole-trimethoprim (BACTRIM DS,SEPTRA DS) 800-160 MG tablet Take 1 tablet by mouth 2 (two) times daily. 10 tablet 0  . SYRINGE-NEEDLE, DISP, 3 ML (B-D 3CC LUER-LOK SYR 25GX1") 25G X 1" 3 ML MISC Use to inject B-12  once monthly dx:E53.8 25 each 0   No current facility-administered medications for this visit.     Allergies  Allergen Reactions  . Latex Other (See Comments)    REACTION: "anaphylactic shock"  . Ramelteon Other (See Comments)    REACTION: blacks out  . Tetracyclines & Related Anaphylaxis and Other (See Comments)    syncope  . Zolpidem Tartrate Other (See Comments)    REACTION: black outs    Social History   Social History  . Marital status: Divorced    Spouse name: N/A  . Number of children: 3  . Years of education: N/A   Occupational History  . Part time Environmental manager    Social History Main Topics  . Smoking status:  Never Smoker  . Smokeless tobacco: Never Used  . Alcohol use 0.0 oz/week     Comment: rare  . Drug use: No  . Sexual activity: Not on file   Other Topics Concern  . Not on file   Social History Narrative                Family History  Problem Relation Age of Onset  . Arthritis Mother   . Coronary artery disease Father   . Coronary artery disease Sister   . Cancer Maternal Grandmother     Ovarian  . Thyroid disease Neg Hx     Review of Systems:  As stated in the HPI and otherwise negative.   There were no vitals taken for this visit.  Physical Examination: General: Well developed, well nourished, NAD  HEENT: OP clear, mucus membranes moist  SKIN: warm, dry. No rashes. Neuro: No focal deficits  Musculoskeletal: Muscle strength 5/5 all ext  Psychiatric: Mood and affect normal  Neck: No JVD, no carotid bruits, no thyromegaly, no lymphadenopathy.  Lungs:Clear bilaterally, no wheezes, rhonci, crackles Cardiovascular: Regular rate and rhythm. No murmurs, gallops or rubs. Abdomen:Soft. Bowel sounds present. Non-tender.  Extremities: Trace RLE edema, 1+ left lower extremity edema-unchanged. Pulses are 2 + in the bilateral DP/PT.  Echo 12/12/12: Left ventricle: The cavity size was normal. Wall thickness was normal. Systolic function was normal. The estimated ejection fraction was in the range of 55% to 65%. Wall motion was normal; there were no regional wall motion abnormalities. Doppler parameters are consistent with abnormal left ventricular relaxation (grade 1 diastolic dysfunction). - Left atrium: The atrium was mildly dilated. - Atrial septum: No defect or patent foramen ovale was Identified.  EKG:  EKG is *** ordered today. The ekg ordered today demonstrates   Recent Labs: 09/23/2016: BUN <5; Creatinine, Ser 0.55; Hemoglobin 13.1; Platelets 217; Potassium 3.6; Sodium 141   Lipid Panel    Component Value Date/Time   CHOL 228 (H) 05/18/2014 0804   TRIG 70.0  05/18/2014 0804   HDL 53.30 05/18/2014 0804   CHOLHDL 4 05/18/2014 0804   VLDL 14.0 05/18/2014 0804   LDLCALC 161 (H) 05/18/2014 0804     Wt Readings from Last 3 Encounters:  09/29/16 (!) 315 lb 1.9 oz (142.9 kg)  09/23/16 (!) 306 lb (138.8 kg)  06/25/16 (!) 314 lb (142.4 kg)     Other studies Reviewed: Additional studies/ records that were reviewed today include: . Review of the above records demonstrates:    Assessment and Plan:   1. Palpitations: No recurrence. No arrythmias noted on 21 day event monitor in 2013.. LV function is normal.  2. LE edema/history of DVT/left lower ext pain: Venous dopplers with chronic, non-occlusive left popliteal vein  thrombus but no right lower ext venous thrombus. IVC filter in place since 2004. She has been off of coumadin for years. She does not wish to restart. No acute indication for coumadin.She is followed in Hematology  3. Morbid obesity: I have counseled her on the importance of weight loss and would recommend that she see a weight loss specialist.   No further cardiac workup at this time.   Current medicines are reviewed at length with the patient today.  The patient does not have concerns regarding medicines.  The following changes have been made:  no change  Labs/ tests ordered today include:   No orders of the defined types were placed in this encounter.   Disposition:   FU with me in 12  months  Signed, Lauree Chandler, MD 12/24/2016 6:22 AM    [facility], [place], [place]  [address] Phone: [phone]; Fax: [phone]

## 2016-12-25 ENCOUNTER — Encounter: Payer: Self-pay | Admitting: Cardiovascular Disease

## 2016-12-25 ENCOUNTER — Ambulatory Visit (INDEPENDENT_AMBULATORY_CARE_PROVIDER_SITE_OTHER): Payer: Medicare Other | Admitting: Cardiovascular Disease

## 2016-12-25 VITALS — BP 118/86 | HR 77 | Ht 63.0 in | Wt 313.4 lb

## 2016-12-25 DIAGNOSIS — R600 Localized edema: Secondary | ICD-10-CM

## 2016-12-25 DIAGNOSIS — R002 Palpitations: Secondary | ICD-10-CM | POA: Diagnosis not present

## 2016-12-25 NOTE — Patient Instructions (Signed)
Medication Instructions:  Your physician recommends that you continue on your current medications as directed. Please refer to the Current Medication list given to you today.   Labwork: none  Testing/Procedures: none  Follow-Up: Your physician recommends that you schedule a follow-up appointment in: 12 months.  Please call our office in about 9 months to schedule this appointment.   You have been referred to Dr. Donnetta Hutching at [facility].      Any Other Special Instructions Will Be Listed Below (If Applicable).     If you need a refill on your cardiac medications before your next appointment, please call your pharmacy.

## 2016-12-25 NOTE — Progress Notes (Signed)
Chief Complaint  Patient presents with  . 12 month f/u    History of Present Illness: 56 yo WF with history of DVT both legs, first in 1996 during trauma, second in 1997 after vein ligation as well as chronic lower extremity edema who was last seen here in December 2013. She had been on coumadin but this was stopped in 2008 by Dr. Albertine Patricia because she was non-compliant and there was no evidence of hypercoagulable state. She has had a negative workup within the last 5 years in hematology by Dr. Humphrey Rolls. She has also been seen in the past by Dr. Eilleen Kempf in [facility] for vein stripping but she did not have that procedure here in [place]. She had prior vein stripping in [place]. Echo December 2010 with normal LV size and function and no significant valvular disease. Venous dopplers April 2011 with chronic left lower ext DVT, no right LE DVT. She has an IVC filter in place. 21 day event monitor December 2013 did not show any evidence of PACs, PVCs, SVT or atrial fibrillation. Venous dopplers showed chronic, non-occlusive thrombus isolated to the left popliteal vein but no thrombus in the right lower extremity venous system. Echo December 2013 with normal LV size and function, grade 1 diastolic dysfunction, mildly dilated left atrium.   She is here today for follow up. She is doing well overall. She has no change in her LE swelling. No palpitations.   Primary Care Physician: [name], MD   Past Medical History:  Diagnosis Date  . Allergy   . Anemia   . Anxiety   . Chronic venous insufficiency   . Depression   . GERD (gastroesophageal reflux disease)   . Greenfield filter in place   . History of DVT (deep vein thrombosis)   . Thyroid disease     Past Surgical History:  Procedure Laterality Date  . ABDOMINAL HYSTERECTOMY    . BIOPSY THYROID  01/03   suggestive of Hashimoto's  . CESAREAN SECTION  1998  . CHOLECYSTECTOMY  1982  . CRANIECTOMY SUBOCCIPITAL W/ CERVICAL LAMINECTOMY  / CHIARI  05/2010   repair, Dr.Pool  . DILATION AND CURETTAGE OF UTERUS  2003  . FETAL SURGERY FOR CONGENITAL HERNIA    . GASTRIC BYPASS    . THYROIDECTOMY  2008   left  . TONSILLECTOMY  1965  . TUBAL LIGATION  1998  . VEIN LIGATION  1995/1998   right leg    Current Outpatient Prescriptions  Medication Sig Dispense Refill  . cyclobenzaprine (FLEXERIL) 10 MG tablet TAKE 1 TABLET (10 MG TOTAL) BY MOUTH 2 (TWO) TIMES DAILY AS NEEDED FOR MUSCLE SPASMS. 60 tablet 2  . diphenhydramine-acetaminophen (TYLENOL PM) 25-500 MG TABS tablet Take 2 tablets by mouth at bedtime.    Marland Kitchen EPINEPHrine 0.3 mg/0.3 mL IJ SOAJ injection Inject 0.3 mLs (0.3 mg total) into the muscle once. 2 Device 1  . levothyroxine (SYNTHROID, LEVOTHROID) 112 MCG tablet TAKE 1 TABLET (112 MCG TOTAL) BY MOUTH DAILY BEFORE BREAKFAST. 90 tablet 0  . Multiple Vitamins-Iron (DAILY MULTIVITAMINS/IRON PO) Take 1 tablet by mouth daily.     . polyethylene glycol (MIRALAX / GLYCOLAX) packet Take 17 g by mouth daily as needed for mild constipation.      No current facility-administered medications for this visit.     Allergies  Allergen Reactions  . Latex Other (See Comments)    REACTION: "anaphylactic shock"  . Ramelteon Other (See Comments)    REACTION: blacks out  .  Tetracyclines & Related Anaphylaxis and Other (See Comments)    syncope  . Zolpidem Tartrate Other (See Comments)    REACTION: black outs    Social History   Social History  . Marital status: Divorced    Spouse name: N/A  . Number of children: 3  . Years of education: N/A   Occupational History  . Part time Environmental manager    Social History Main Topics  . Smoking status: Never Smoker  . Smokeless tobacco: Never Used  . Alcohol use 0.0 oz/week     Comment: rare  . Drug use: No  . Sexual activity: Not on file   Other Topics Concern  . Not on file   Social History Narrative                Family History  Problem Relation Age of Onset  .  Arthritis Mother   . Coronary artery disease Father   . Coronary artery disease Sister   . Cancer Maternal Grandmother     Ovarian  . Thyroid disease Neg Hx     Review of Systems:  As stated in the HPI and otherwise negative.   BP 118/86   Pulse 77   Ht 5\' 3"  (1.6 m)   Wt (!) 313 lb 6.4 oz (142.2 kg)   SpO2 97%   BMI 55.52 kg/m   Physical Examination: General: Well developed, well nourished, NAD  HEENT: OP clear, mucus membranes moist  SKIN: warm, dry. No rashes. Neuro: No focal deficits  Musculoskeletal: Muscle strength 5/5 all ext  Psychiatric: Mood and affect normal  Neck: No JVD, no carotid bruits, no thyromegaly, no lymphadenopathy.  Lungs:Clear bilaterally, no wheezes, rhonci, crackles Cardiovascular: Regular rate and rhythm. No murmurs, gallops or rubs. Abdomen:Soft. Bowel sounds present. Non-tender.  Extremities: Trace RLE edema, 1+ left lower extremity edema-unchanged. Pulses are 2 + in the bilateral DP/PT.  Echo 12/12/12: Left ventricle: The cavity size was normal. Wall thickness was normal. Systolic function was normal. The estimated ejection fraction was in the range of 55% to 65%. Wall motion was normal; there were no regional wall motion abnormalities. Doppler parameters are consistent with abnormal left ventricular relaxation (grade 1 diastolic dysfunction). - Left atrium: The atrium was mildly dilated. - Atrial septum: No defect or patent foramen ovale was Identified.  EKG:  EKG is not ordered today. The ekg ordered today demonstrates   Recent Labs: 09/23/2016: BUN <5; Creatinine, Ser 0.55; Hemoglobin 13.1; Platelets 217; Potassium 3.6; Sodium 141   Lipid Panel    Component Value Date/Time   CHOL 228 (H) 05/18/2014 0804   TRIG 70.0 05/18/2014 0804   HDL 53.30 05/18/2014 0804   CHOLHDL 4 05/18/2014 0804   VLDL 14.0 05/18/2014 0804   LDLCALC 161 (H) 05/18/2014 0804     Wt Readings from Last 3 Encounters:  12/25/16 (!) 313 lb 6.4 oz (142.2 kg)    09/29/16 (!) 315 lb 1.9 oz (142.9 kg)  09/23/16 (!) 306 lb (138.8 kg)     Other studies Reviewed: Additional studies/ records that were reviewed today include: . Review of the above records demonstrates:    Assessment and Plan:   1. Palpitations: No recurrence. No arrythmias noted on 21 day event monitor in 2013.. LV function is normal.  2. LE edema/history of DVT/left lower ext pain: Venous dopplers October 2017 with no DVT. IVC filter in place since 2004. She has been off of coumadin for years. She does not wish to restart. She had  been followed in Hematology but was released. She is asking to be seen in [facility] by DR. Early to discuss vein stripping. We will make that referral to [facility].   3. Morbid obesity: I have counseled her on the importance of weight loss and would recommend that she see a weight loss specialist to discuss options. She has had previous gastric bypass surgery  No further cardiac workup at this time.   Current medicines are reviewed at length with the patient today.  The patient does not have concerns regarding medicines.  The following changes have been made:  no change  Labs/ tests ordered today include:   Orders Placed This Encounter  Procedures  . Ambulatory referral to Vascular Surgery    Disposition:   FU with me in 12  months  Signed, Lauree Chandler, MD 12/25/2016 2:48 PM    [facility], [place], [place]  [address] Phone: [phone]; Fax: [phone]

## 2016-12-28 ENCOUNTER — Ambulatory Visit: Payer: Medicare Other | Admitting: Internal Medicine

## 2017-01-07 ENCOUNTER — Ambulatory Visit: Payer: Medicare Other | Admitting: Internal Medicine

## 2017-02-07 ENCOUNTER — Other Ambulatory Visit: Payer: Self-pay | Admitting: Internal Medicine

## 2017-02-15 DIAGNOSIS — M7542 Impingement syndrome of left shoulder: Secondary | ICD-10-CM | POA: Diagnosis not present

## 2017-03-07 ENCOUNTER — Telehealth: Payer: Self-pay | Admitting: Internal Medicine

## 2017-03-08 NOTE — Telephone Encounter (Signed)
Lm on pts vm and advised Rx sent to pharmacy. Request she contact office to schedule CPE

## 2017-03-08 NOTE — Telephone Encounter (Signed)
Approved: okay thyroid for a year Cyclobenzaprine #30 x 2 Have her set up physical soon

## 2017-03-08 NOTE — Telephone Encounter (Signed)
Patient called to let you know she already had a physical scheduled on 04/23/17.

## 2017-03-08 NOTE — Telephone Encounter (Signed)
Last labs 11/2014. Last OV 09/2016-acute. pls advise

## 2017-03-31 DIAGNOSIS — M7542 Impingement syndrome of left shoulder: Secondary | ICD-10-CM | POA: Diagnosis not present

## 2017-04-01 ENCOUNTER — Encounter: Payer: Self-pay | Admitting: Vascular Surgery

## 2017-04-06 ENCOUNTER — Encounter: Payer: Self-pay | Admitting: Gastroenterology

## 2017-04-06 ENCOUNTER — Ambulatory Visit (INDEPENDENT_AMBULATORY_CARE_PROVIDER_SITE_OTHER): Payer: Medicare Other | Admitting: Gastroenterology

## 2017-04-06 VITALS — BP 114/72 | HR 68 | Ht 64.25 in | Wt 315.0 lb

## 2017-04-06 DIAGNOSIS — K5900 Constipation, unspecified: Secondary | ICD-10-CM

## 2017-04-06 DIAGNOSIS — R1310 Dysphagia, unspecified: Secondary | ICD-10-CM

## 2017-04-06 DIAGNOSIS — K649 Unspecified hemorrhoids: Secondary | ICD-10-CM | POA: Diagnosis not present

## 2017-04-06 MED ORDER — NA SULFATE-K SULFATE-MG SULF 17.5-3.13-1.6 GM/177ML PO SOLN: 1.0000 | Freq: Once | ORAL | 0 refills | Status: AC

## 2017-04-06 NOTE — Progress Notes (Signed)
HPI: This is a   very pleasant 56 year old woman  who was referred to me by [name], MD  to evaluate  hemorrhoids, rectal bleeding, dysphagia .    Chief complaint is hemorrhoids, rectal bleeding, dysphagia, constipation  She is having wicked constipation, hemorrhoid issues.  Hemorrhoids for 25 years.  She uses multiple wipes.  They bleed daily.  Usually has small hard stools, then explosive stools.  Will have 1-2 BMs per week.  Went to [facility] surgery, Dr. Prince Solian; she was not happy with the visit.  She has pain after eating in her substernum.  Drooling a lot after she eats. Has a dysphagia like symptom after eating as well.  Mother had colon polyps.  NO colon cancer.  Takes miralax PRN, pretty rarely.  Overall weight gain in past 4 year; 100 poiunds  Old Data Reviewed:  Colonoscopy Dr. Ardis Hughs 2006 for anemia. Hemorrhoids were noted, tortuous colon, single small polyp that was hyperplastic on pathology. Recommended 10 year recall for colon cancer screening. EGD Dr. Ardis Hughs 2006 for anemia. Normal post Roux-en-Y bariatric surgery. EGD Dr. Ardis Hughs 2009 for dysphagia, vomiting. Normal post Roux-en-Y bariatric surgery. No explanation for her symptoms. She was recommended to chew her food better, small bites, take her time when eating.  Cbc 09/2016 normal   Review of systems: Pertinent positive and negative review of systems were noted in the above HPI section. Complete review of systems was performed and was otherwise normal.   Past Medical History:  Diagnosis Date  . Allergy   . Anemia   . Anxiety   . Chronic venous insufficiency   . Depression   . GERD (gastroesophageal reflux disease)   . Greenfield filter in place   . History of DVT (deep vein thrombosis)   . Hypothyroidism   . Thyroid cancer [facility])     Past Surgical History:  Procedure Laterality Date  . ABDOMINAL HYSTERECTOMY    . BIOPSY THYROID  01/03   suggestive of Hashimoto's  . CESAREAN SECTION  1998  .  CHOLECYSTECTOMY  1982  . CRANIECTOMY SUBOCCIPITAL W/ CERVICAL LAMINECTOMY / CHIARI  05/2010   repair, Dr.Pool  . DILATION AND CURETTAGE OF UTERUS  2003  . FETAL SURGERY FOR CONGENITAL HERNIA    . GASTRIC BYPASS    . THYROIDECTOMY  2008   left  . TONSILLECTOMY  1965  . TUBAL LIGATION  1998  . VEIN LIGATION  1995/1998   right leg    Current Outpatient Prescriptions  Medication Sig Dispense Refill  . Cyanocobalamin (VITAMIN B-12 IJ) Inject 1,000 mcg as directed every 30 (thirty) days.    . cyclobenzaprine (FLEXERIL) 10 MG tablet TAKE 1 TABLET (10 MG TOTAL) BY MOUTH 2 (TWO) TIMES DAILY AS NEEDED FOR MUSCLE SPASMS. 30 tablet 2  . diphenhydramine-acetaminophen (TYLENOL PM) 25-500 MG TABS tablet Take 2 tablets by mouth at bedtime.    Marland Kitchen levothyroxine (SYNTHROID, LEVOTHROID) 112 MCG tablet TAKE 1 TABLET (112 MCG TOTAL) BY MOUTH DAILY BEFORE BREAKFAST. 90 tablet 3  . Multiple Vitamins-Iron (DAILY MULTIVITAMINS/IRON PO) Take 1 tablet by mouth daily.     . polyethylene glycol (MIRALAX / GLYCOLAX) packet Take 17 g by mouth as needed for mild constipation.     Marland Kitchen EPINEPHrine 0.3 mg/0.3 mL IJ SOAJ injection Inject 0.3 mLs (0.3 mg total) into the muscle once. (Patient not taking: Reported on 04/06/2017) 2 Device 1   No current facility-administered medications for this visit.     Allergies as of 04/06/2017 - Review  Complete 04/06/2017  Allergen Reaction Noted  . Ambien [zolpidem tartrate]  12/09/2006  . Latex Other (See Comments) 12/09/2006  . Ramelteon Other (See Comments) 12/09/2006  . Tetracyclines & related Anaphylaxis and Other (See Comments) 04/30/2011    Family History  Problem Relation Age of Onset  . Arthritis Mother   . Coronary artery disease Father   . Kidney cancer Father   . Lung cancer Father   . Coronary artery disease Sister   . Ovarian cancer Maternal Grandmother   . Thyroid disease Neg Hx     Social History   Social History  . Marital status: Divorced    Spouse  name: N/A  . Number of children: 3  . Years of education: N/A   Occupational History  . Part time Environmental manager    Social History Main Topics  . Smoking status: Never Smoker  . Smokeless tobacco: Never Used  . Alcohol use 0.0 oz/week     Comment: rare  . Drug use: No  . Sexual activity: Not on file   Other Topics Concern  . Not on file   Social History Narrative                 Physical Exam: Ht 5' 4.25" (1.632 m) Comment: height measured without shoes  Wt (!) 315 lb (142.9 kg)   BMI 53.65 kg/m  Constitutional: generally well-appearing Psychiatric: alert and oriented x3 Eyes: extraocular movements intact Mouth: Edentulous Neck: supple no lymphadenopathy Cardiovascular: heart regular rate and rhythm Lungs: clear to auscultation bilaterally Abdomen: soft, nontender, nondistended, no obvious ascites, no peritoneal signs, normal bowel sounds Extremities: no lower extremity edema bilaterally Skin: no lesions on visible extremities Rectal exam deferred for upcoming colonoscopy  Assessment and plan: 56 y.o. female with  Hemorrhoids, constipation, dysphagia  She has chronic hemorrhoids, constipation. Last colonoscopy was 12 years ago. I recommended repeat screening colonoscopy at the same time I will evaluate her hemorrhoids situation. If she has internal hemorrhoids I will probably refer her to one of my partners for consideration of in office hemorrhoidal banding. This can help most of the time.    she is going to start daily fiber suppelements.  She has trouble with swallowing his pain after swallowing. She also drools a lot. I suspect this is related to the fact that she has no teeth. I recommended upper endoscopy at same time as her colonoscopy to be certainly were not missing anything more serius.      Please see the "Patient Instructions" section for addition details about the plan.   Owens Loffler, MD [facility] 04/06/2017, 8:35 AM  Cc:  [name], MD

## 2017-04-06 NOTE — Patient Instructions (Signed)
You will be set up for a colonoscopy for colon cancer screening. You will be set up for an upper endoscopy for dysphagia although probably your swallowing trouble is from lack of teeth. Please start taking citrucel (orange flavored) powder fiber supplement.  This may cause some bloating at first but that usually goes away. Begin with a small spoonful and work your way up to a large, heaping spoonful daily over a week.

## 2017-04-12 ENCOUNTER — Telehealth: Payer: Self-pay | Admitting: *Deleted

## 2017-04-12 ENCOUNTER — Other Ambulatory Visit: Payer: Self-pay | Admitting: *Deleted

## 2017-04-12 DIAGNOSIS — I83893 Varicose veins of bilateral lower extremities with other complications: Secondary | ICD-10-CM

## 2017-04-12 NOTE — Telephone Encounter (Signed)
Chart opened in error. Was to place order and opened telephone encounter.

## 2017-04-13 ENCOUNTER — Encounter: Payer: Self-pay | Admitting: Vascular Surgery

## 2017-04-13 ENCOUNTER — Ambulatory Visit (INDEPENDENT_AMBULATORY_CARE_PROVIDER_SITE_OTHER): Payer: Medicare Other | Admitting: Vascular Surgery

## 2017-04-13 ENCOUNTER — Ambulatory Visit (HOSPITAL_COMMUNITY)
Admission: RE | Admit: 2017-04-13 | Discharge: 2017-04-13 | Disposition: A | Discharge status: Home / Self Care | Payer: Medicare Other | Source: Ambulatory Visit | Attending: Vascular Surgery | Admitting: Vascular Surgery

## 2017-04-13 VITALS — BP 136/91 | HR 72 | Temp 97.6°F | Resp 18 | Ht 64.0 in | Wt 313.0 lb

## 2017-04-13 DIAGNOSIS — I83893 Varicose veins of bilateral lower extremities with other complications: Secondary | ICD-10-CM | POA: Diagnosis not present

## 2017-04-13 DIAGNOSIS — I87303 Chronic venous hypertension (idiopathic) without complications of bilateral lower extremity: Secondary | ICD-10-CM

## 2017-04-13 NOTE — Progress Notes (Signed)
[facility] [place]  Patient name: [name] MRN: [id number] DOB: 03-Dec-1961 Sex: female  REASON FOR CONSULT: Bilateral lower extremity venous hypertension left greater than right  HPI: [name] is a 56 y.o. female, who is seen today for evaluation of her venous hypertension. She has a very long complex past history of venous pathology. She had DVT in the past documented at least in her left leg. Had placement of a Greenfield filter in 2004 for before gastric obesity surgery. She had multiple complications around the time of the surgery and then under went repeat surgery due to herniation internally from the treatment. Her vena caval filter is still in place. 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 This is worse at the end of the day. She is morbidly obese and has difficult time with elevation and also has a latex allergy and therefore making it very difficult for her to wear any Type of compression garments. Forced me she has no history of venous ulceration. She currently is not on any anticoagulation therapy  Past Medical History:  Diagnosis Date  . Allergy   . Anemia   . Anxiety   . Chronic venous insufficiency   . Depression   . GERD (gastroesophageal reflux disease)   . Greenfield filter in place   . History of DVT (deep vein thrombosis)   . Hypothyroidism   . Thyroid cancer ([place])     Family History  Problem Relation Age of Onset  . Arthritis Mother   . Coronary artery disease Father   . Kidney cancer Father   . Lung cancer Father   . Coronary artery disease Sister   . Ovarian cancer Maternal Grandmother   . Thyroid disease Neg Hx     SOCIAL HISTORY: Social History   Social History  . Marital  status: Divorced    Spouse name: N/A  . Number of children: 3  . Years of education: N/A   Occupational History  . Part time Environmental manager    Social History Main Topics  . Smoking status: Never Smoker  . Smokeless tobacco: Never Used  . Alcohol use 0.0 oz/week     Comment: rare  . Drug use: No  . Sexual activity: Not on file   Other Topics Concern  . Not on file   Social History Narrative                Allergies  Allergen Reactions  . Ambien [Zolpidem Tartrate]     [employer]  . Latex Other (See Comments)    REACTION: "anaphylactic shock"  . Ramelteon Other (See Comments)    REACTION: blacks out  . Tetracyclines & Related Anaphylaxis and Other (See Comments)    syncope    Current Outpatient Prescriptions  Medication Sig Dispense Refill  . Cyanocobalamin (VITAMIN B-12 IJ) Inject 1,000 mcg as directed every 30 (thirty) days.    . cyclobenzaprine (FLEXERIL) 10 MG tablet TAKE 1 TABLET (10 MG TOTAL) BY MOUTH 2 (TWO) TIMES DAILY AS NEEDED FOR MUSCLE SPASMS. 30 tablet 2  . diphenhydramine-acetaminophen (TYLENOL PM) 25-500 MG TABS tablet Take 2 tablets by mouth at bedtime.    Marland Kitchen EPINEPHrine 0.3 mg/0.3 mL IJ SOAJ injection Inject 0.3 mLs (0.3 mg total) into the muscle  once. (Patient not taking: Reported on 04/06/2017) 2 Device 1  . levothyroxine (SYNTHROID, LEVOTHROID) 112 MCG tablet TAKE 1 TABLET (112 MCG TOTAL) BY MOUTH DAILY BEFORE BREAKFAST. 90 tablet 3  . Multiple Vitamins-Iron (DAILY MULTIVITAMINS/IRON PO) Take 1 tablet by mouth daily.     . polyethylene glycol (MIRALAX / GLYCOLAX) packet Take 17 g by mouth as needed for mild constipation.      No current facility-administered medications for this visit.     REVIEW OF SYSTEMS:  [X]  denotes positive finding, [ ]  denotes negative finding Cardiac  Comments:  Chest pain or chest pressure:    Shortness of breath upon exertion:    Short of breath when lying flat:    Irregular heart rhythm:        Vascular      Pain in calf, thigh, or hip brought on by ambulation: x   Pain in feet at night that wakes you up from your sleep:     Blood clot in your veins: x   Leg swelling:  x       Pulmonary    Oxygen at home:    Productive cough:     Wheezing:         Neurologic    Sudden weakness in arms or legs:     Sudden numbness in arms or legs:     Sudden onset of difficulty speaking or slurred speech:    Temporary loss of vision in one eye:     Problems with dizziness:         Gastrointestinal    Blood in stool:     Vomited blood:         Genitourinary    Burning when urinating:     Blood in urine:        Psychiatric    Major depression:         Hematologic    Bleeding problems:    Problems with blood clotting too easily:        Skin    Rashes or ulcers:        Constitutional    Fever or chills:      PHYSICAL EXAM: Vitals:   04/13/17 1313  BP: (!) 136/91  Pulse: 72  Resp: 18  Temp: 97.6 F (36.4 C)  SpO2: 100%  Weight: (!) 313 lb (142 kg)  Height: 5\' 4"  (1.626 m)    GENERAL: The patient is a well-nourished female, in no acute distress. The vital signs are documented above. CARDIOVASCULAR: Normal radial and palpable dorsalis pedis pulses bilaterally PULMONARY: There is good air exchange  ABDOMEN: Soft and non-tender morbidly obese MUSCULOSKELETAL: There are no major deformities or cyanosis. NEUROLOGIC: No focal weakness or paresthesias are detected. SKIN: There are no ulcers or rashes noted. Does have marked swelling bilaterally more so on her left leg and her right. Old scars from prior phlebectomy incisions in her right leg PSYCHIATRIC: The patient has a normal affect.  DATA:  Noninvasive studies from 02/13/2017 were reviewed with the patient. This shows no evidence of superficial or deep venous thrombosis. She does not have any evidence of reflux in her deep veins on the right and has reflux only in the right anterior accessory branch. On the left she has extensive  reflux throughout her deep and superficial system and the small saphenous and great saphenous and also throughout the common femoral vein, femoral vein and popliteal vein  MEDICAL ISSUES: Bilateral venous hypertension worse on the left than  on the right. I did explain the potential improvement with ablation of her great and small saphenous vein. She does have extensive reflux throughout her entire deep system on the left leg. I feel that she would have minimal improvement with ablation. Also feel that she would be at increased risk with her history of extensive DVT around the time of any manipulation. I have encouraged her to continue elevation when possible. She was reassured this discussion will see [place] again on an as-needed basis   Rosetta Posner, MD [facility] Tel [phone] Pager [phone]

## 2017-04-23 ENCOUNTER — Ambulatory Visit (INDEPENDENT_AMBULATORY_CARE_PROVIDER_SITE_OTHER): Payer: Medicare Other | Admitting: Internal Medicine

## 2017-04-23 ENCOUNTER — Encounter: Payer: Self-pay | Admitting: Internal Medicine

## 2017-04-23 VITALS — BP 124/72 | HR 71 | Temp 97.7°F | Ht 64.5 in | Wt 313.0 lb

## 2017-04-23 DIAGNOSIS — Z6841 Body Mass Index (BMI) 40.0 and over, adult: Secondary | ICD-10-CM

## 2017-04-23 DIAGNOSIS — E038 Other specified hypothyroidism: Secondary | ICD-10-CM | POA: Diagnosis not present

## 2017-04-23 DIAGNOSIS — E538 Deficiency of other specified B group vitamins: Secondary | ICD-10-CM | POA: Diagnosis not present

## 2017-04-23 DIAGNOSIS — Z0000 Encounter for general adult medical examination without abnormal findings: Secondary | ICD-10-CM | POA: Diagnosis not present

## 2017-04-23 DIAGNOSIS — I872 Venous insufficiency (chronic) (peripheral): Secondary | ICD-10-CM | POA: Diagnosis not present

## 2017-04-23 LAB — CBC WITH DIFFERENTIAL/PLATELET
Basophils Absolute: 0 10*3/uL (ref 0.0–0.1)
Basophils Relative: 0.8 % (ref 0.0–3.0)
EOS PCT: 3.7 % (ref 0.0–5.0)
Eosinophils Absolute: 0.2 10*3/uL (ref 0.0–0.7)
HCT: 39.5 % (ref 36.0–46.0)
Hemoglobin: 13.2 g/dL (ref 12.0–15.0)
LYMPHS ABS: 2.3 10*3/uL (ref 0.7–4.0)
Lymphocytes Relative: 42.8 % (ref 12.0–46.0)
MCHC: 33.3 g/dL (ref 30.0–36.0)
MCV: 94.5 fl (ref 78.0–100.0)
MONO ABS: 0.4 10*3/uL (ref 0.1–1.0)
MONOS PCT: 7.9 % (ref 3.0–12.0)
NEUTROS ABS: 2.4 10*3/uL (ref 1.4–7.7)
NEUTROS PCT: 44.8 % (ref 43.0–77.0)
PLATELETS: 239 10*3/uL (ref 150.0–400.0)
RBC: 4.18 Mil/uL (ref 3.87–5.11)
RDW: 13.5 % (ref 11.5–15.5)
WBC: 5.4 10*3/uL (ref 4.0–10.5)

## 2017-04-23 LAB — COMPREHENSIVE METABOLIC PANEL
ALK PHOS: 140 U/L — AB (ref 39–117)
ALT: 11 U/L (ref 0–35)
AST: 14 U/L (ref 0–37)
Albumin: 4.1 g/dL (ref 3.5–5.2)
BUN: 6 mg/dL (ref 6–23)
CO2: 29 meq/L (ref 19–32)
Calcium: 9 mg/dL (ref 8.4–10.5)
Chloride: 113 mEq/L — ABNORMAL HIGH (ref 96–112)
Creatinine, Ser: 0.56 mg/dL (ref 0.40–1.20)
GFR: 119.05 mL/min (ref >60.00)
GLUCOSE: 100 mg/dL — AB (ref 70–99)
POTASSIUM: 4.2 meq/L (ref 3.5–5.1)
SODIUM: 148 meq/L — AB (ref 135–145)
TOTAL PROTEIN: 7.1 g/dL (ref 6.0–8.3)
Total Bilirubin: 1 mg/dL (ref 0.2–1.2)

## 2017-04-23 LAB — T4, FREE: FREE T4: 0.96 ng/dL (ref 0.60–1.60)

## 2017-04-23 LAB — VITAMIN B12: Vitamin B-12: 90 pg/mL — ABNORMAL LOW (ref 211–911)

## 2017-04-23 LAB — TSH: TSH: 0.96 u[IU]/mL (ref 0.35–4.50)

## 2017-04-23 MED ORDER — CYANOCOBALAMIN 1000 MCG/ML IJ SOLN: 1000.0000 ug | Freq: Once | INTRAMUSCULAR | 11 refills | Status: AC

## 2017-04-23 MED ORDER — TETANUS-DIPHTHERIA TOXOIDS TD 5-2 LFU IM INJ: 0.5000 mL | INJECTION | Freq: Once | INTRAMUSCULAR | 0 refills | Status: AC

## 2017-04-23 NOTE — Patient Instructions (Signed)
Please get your tetanus booster at the pharmacy. Set up your screening mammogram.

## 2017-04-23 NOTE — Progress Notes (Signed)
Subjective:    Patient ID: [name], female    DOB: 23-Dec-1960, 56 y.o.   MRN: [id number]  HPI Here for Medicare wellness and follow up of chronic medical conditions Long term Medicare since disabled--- still works part time Reviewed form and advanced directives Reviewed other doctors---colonoscopy planned this month. May need treatment for hemorrhoids also. Sees Dr Donnetta Hutching also for vascular No tobacco ---very rare alcohol Doesn't exercise--but is planning to join [employer] class No falls No depression or anhedonia Independent with instrumental ADLs---sons help with heavy work No memory issues of note  Chronic edema Not a candidate for procedure on veins Can't wear support hose Pain and swelling by the end of the day Gets DOE--relates to weight No chest pain No dizziness or syncope  Stable thyroid dose Is going back to endocrine--for the nodule  Tries to to follow a healthy diet Skips dinner--tries to make lunch her largest meal Plans to restart exercise  Current Outpatient Prescriptions on File Prior to Visit  Medication Sig Dispense Refill  . Cyanocobalamin (VITAMIN B-12 IJ) Inject 1,000 mcg as directed every 30 (thirty) days.    . cyclobenzaprine (FLEXERIL) 10 MG tablet TAKE 1 TABLET (10 MG TOTAL) BY MOUTH 2 (TWO) TIMES DAILY AS NEEDED FOR MUSCLE SPASMS. 30 tablet 2  . diphenhydramine-acetaminophen (TYLENOL PM) 25-500 MG TABS tablet Take 2 tablets by mouth at bedtime.    Marland Kitchen EPINEPHrine 0.3 mg/0.3 mL IJ SOAJ injection Inject 0.3 mLs (0.3 mg total) into the muscle once. 2 Device 1  . levothyroxine (SYNTHROID, LEVOTHROID) 112 MCG tablet TAKE 1 TABLET (112 MCG TOTAL) BY MOUTH DAILY BEFORE BREAKFAST. 90 tablet 3  . Multiple Vitamins-Iron (DAILY MULTIVITAMINS/IRON PO) Take 1 tablet by mouth daily.      No current facility-administered medications on file prior to visit.     Allergies  Allergen Reactions  . Ambien [Zolpidem Tartrate]     [employer]  . Latex  Other (See Comments)    REACTION: "anaphylactic shock"  . Ramelteon Other (See Comments)    REACTION: blacks out  . Tetracyclines & Related Anaphylaxis and Other (See Comments)    syncope    Past Medical History:  Diagnosis Date  . Allergy   . Anemia   . Anxiety   . Chronic venous insufficiency   . Depression   . GERD (gastroesophageal reflux disease)   . Greenfield filter in place   . History of DVT (deep vein thrombosis)   . Hypothyroidism   . Thyroid cancer [facility])     Past Surgical History:  Procedure Laterality Date  . ABDOMINAL HYSTERECTOMY    . BIOPSY THYROID  01/03   suggestive of Hashimoto's  . CESAREAN SECTION  1998  . CHOLECYSTECTOMY  1982  . CRANIECTOMY SUBOCCIPITAL W/ CERVICAL LAMINECTOMY / CHIARI  05/2010   repair, Dr.Pool  . DILATION AND CURETTAGE OF UTERUS  2003  . FETAL SURGERY FOR CONGENITAL HERNIA    . GASTRIC BYPASS    . THYROIDECTOMY  2008   left  . TONSILLECTOMY  1965  . TUBAL LIGATION  1998  . VEIN LIGATION  1995/1998   right leg    Family History  Problem Relation Age of Onset  . Arthritis Mother   . Coronary artery disease Father   . Kidney cancer Father   . Lung cancer Father   . Coronary artery disease Sister   . Ovarian cancer Maternal Grandmother   . Thyroid disease Neg Hx  Social History   Social History  . Marital status: Divorced    Spouse name: N/A  . Number of children: 3  . Years of education: N/A   Occupational History  . Part time Environmental manager    Social History Main Topics  . Smoking status: Never Smoker  . Smokeless tobacco: Never Used  . Alcohol use 0.0 oz/week     Comment: rare  . Drug use: No  . Sexual activity: Not on file   Other Topics Concern  . Not on file   Social History Narrative   No living will   Requests mother to make health care decision   Would accept resuscitation attempts   Not sure about tube feeds            Review of Systems  Appetite is fine Weight is fairly  stable Not a good sleeper--chronic Wears seat belt No teeth--cannot wear dentures Bowels are a problem--due to hemorrhoids Voids okay. Rare incontinence No major back or joint pain No rash or suspicious lesions Losing hair--when brushing No heartburn or dysphagia--but gets heavy feeling after eating at times. Not on acid blocker (this is only intermittent)     Objective:   Physical Exam  Constitutional: She is oriented to person, place, and time. No distress.  HENT:  Mouth/Throat: Oropharynx is clear and moist. No oropharyngeal exudate.  edentulous  Neck: No thyromegaly present.  Cardiovascular: Normal rate, regular rhythm, normal heart sounds and intact distal pulses.  Exam reveals no gallop.   No murmur heard. Pulmonary/Chest: Effort normal and breath sounds normal. No respiratory distress. She has no wheezes. She has no rales.  Abdominal: Soft. There is no tenderness.  Musculoskeletal:  2-3+ non pitting edema in feet/calves varicosities  Lymphadenopathy:    She has no cervical adenopathy.  Neurological: She is alert and oriented to person, place, and time.  President--- "Dwaine Deter, Bush" [phone] [id number] Recall 3/3  Skin: No rash noted. No erythema.  Psychiatric: She has a normal mood and affect. Her behavior is normal.          Assessment & Plan:

## 2017-04-23 NOTE — Progress Notes (Signed)
Pre visit review using our clinic review tool, if applicable. No additional management support is needed unless otherwise documented below in the visit note. 

## 2017-04-23 NOTE — Assessment & Plan Note (Signed)
Not eligible for procedure Would hold off on furosemide

## 2017-04-23 NOTE — Assessment & Plan Note (Signed)
I have personally reviewed the Medicare Annual Wellness questionnaire and have noted 1. The patient's medical and social history 2. Their use of alcohol, tobacco or illicit drugs 3. Their current medications and supplements 4. The patient's functional ability including ADL's, fall risks, home safety risks and hearing or visual             impairment. 5. Diet and physical activities 6. Evidence for depression or mood disorders  The patients weight, height, BMI and visual acuity have been recorded in the chart I have made referrals, counseling and provided education to the patient based review of the above and I have provided the pt with a written personalized care plan for preventive services.  I have provided you with a copy of your personalized plan for preventive services. Please take the time to review along with your updated medication list.  Plans to work on exercise Will set up mammo No Pap due to hyster Has colon scheduled Td Rx sent

## 2017-04-23 NOTE — Assessment & Plan Note (Signed)
Discussed fitness and eating Considering banding

## 2017-04-23 NOTE — Assessment & Plan Note (Signed)
Will check levels 

## 2017-04-23 NOTE — Assessment & Plan Note (Signed)
Will check labs Nodule not particularly noticeable

## 2017-04-28 ENCOUNTER — Other Ambulatory Visit: Payer: Self-pay | Admitting: Internal Medicine

## 2017-04-28 NOTE — Telephone Encounter (Signed)
Last refill 03/08/17 Last OV 04/23/17 Ok to refill?

## 2017-04-28 NOTE — Telephone Encounter (Signed)
Approved: #[id number] x 1 

## 2017-05-03 DIAGNOSIS — S86811A Strain of other muscle(s) and tendon(s) at lower leg level, right leg, initial encounter: Secondary | ICD-10-CM | POA: Diagnosis not present

## 2017-05-03 DIAGNOSIS — M1711 Unilateral primary osteoarthritis, right knee: Secondary | ICD-10-CM | POA: Diagnosis not present

## 2017-05-13 ENCOUNTER — Ambulatory Visit (HOSPITAL_COMMUNITY): Admission: RE | Admit: 2017-05-13 | Payer: Medicare Other | Source: Ambulatory Visit | Admitting: Gastroenterology

## 2017-05-13 SURGERY — COLONOSCOPY WITH PROPOFOL
Anesthesia: Monitor Anesthesia Care

## 2017-07-08 ENCOUNTER — Other Ambulatory Visit: Payer: Self-pay | Admitting: Internal Medicine

## 2017-07-08 NOTE — Telephone Encounter (Signed)
Approved: #[phone] x 3 

## 2017-09-03 ENCOUNTER — Telehealth: Payer: Self-pay

## 2017-09-03 NOTE — Telephone Encounter (Signed)
Dolly at [facility] left v/m generic synthroid milan brand is not in stock; [facility] wants to substitute generic synthroid by different manufacturer which may change lab results.FYI to Dr Silvio Pate.

## 2017-09-03 NOTE — Telephone Encounter (Signed)
That would be okay to change. I think her levels would be fine even if there is 20% variability in either direction. I would recommend checking levels after the change only if she doesn't feel right

## 2017-09-06 NOTE — Telephone Encounter (Signed)
Spoke to pharmacist Marjory Lies and advised. States he will advise pt when he contacts her to let her know Rx is available for pickup

## 2017-11-05 ENCOUNTER — Other Ambulatory Visit: Payer: Self-pay | Admitting: Internal Medicine

## 2017-11-07 DIAGNOSIS — Z23 Encounter for immunization: Secondary | ICD-10-CM | POA: Diagnosis not present

## 2017-12-09 ENCOUNTER — Other Ambulatory Visit: Payer: Self-pay | Admitting: Internal Medicine

## 2018-01-04 ENCOUNTER — Other Ambulatory Visit: Payer: Self-pay | Admitting: Internal Medicine

## 2018-02-03 ENCOUNTER — Other Ambulatory Visit: Payer: Self-pay | Admitting: Internal Medicine

## 2018-02-04 NOTE — Telephone Encounter (Signed)
Approved: #[id number] x 1 

## 2018-02-22 ENCOUNTER — Ambulatory Visit (INDEPENDENT_AMBULATORY_CARE_PROVIDER_SITE_OTHER): Payer: Medicare Other | Admitting: Internal Medicine

## 2018-02-22 ENCOUNTER — Encounter: Payer: Self-pay | Admitting: Internal Medicine

## 2018-02-22 VITALS — BP 124/78 | HR 63 | Temp 97.9°F | Wt 306.0 lb

## 2018-02-22 DIAGNOSIS — J0110 Acute frontal sinusitis, unspecified: Secondary | ICD-10-CM | POA: Diagnosis not present

## 2018-02-22 MED ORDER — AMOXICILLIN-POT CLAVULANATE 875-125 MG PO TABS: 1.0000 | ORAL_TABLET | Freq: Two times a day (BID) | ORAL | 0 refills | Status: DC

## 2018-02-22 NOTE — Progress Notes (Signed)
HPI  Pt presents to the clinic today with c/o headache, ear pain and nasal congestion. She reports this started 4-5 days ago. She describes the headache as pressure mainly in her forehead. She describes the ear pain as fullness. She does have some muffled hearing, R>L. She is blowing green mucous out of her nose. She denies fever, chills or body aches. She has tried Advil Cold and Sinus with minimal relief. She has a history of allergies. She has had sick contacts.  Review of Systems      Past Medical History:  Diagnosis Date  . Allergy   . Anemia   . Anxiety   . Chronic venous insufficiency   . Depression   . GERD (gastroesophageal reflux disease)   . Greenfield filter in place   . History of DVT (deep vein thrombosis)   . Hypothyroidism   . Thyroid cancer ([place])     Family History  Problem Relation Age of Onset  . Arthritis Mother   . Coronary artery disease Father   . Kidney cancer Father   . Lung cancer Father   . Coronary artery disease Sister   . Ovarian cancer Maternal Grandmother   . Thyroid disease Neg Hx     Social History   Socioeconomic History  . Marital status: Divorced    Spouse name: Not on file  . Number of children: 3  . Years of education: Not on file  . Highest education level: Not on file  Social Needs  . Financial resource strain: Not on file  . Food insecurity - worry: Not on file  . Food insecurity - inability: Not on file  . Transportation needs - medical: Not on file  . Transportation needs - non-medical: Not on file  Occupational History  . Occupation: Part time Environmental manager  Tobacco Use  . Smoking status: Never Smoker  . Smokeless tobacco: Never Used  Substance and Sexual Activity  . Alcohol use: Yes    Alcohol/week: 0.0 oz    Comment: rare  . Drug use: No  . Sexual activity: Not on file  Other Topics Concern  . Not on file  Social History Narrative   No living will   Requests mother to make health care decision   Would  accept resuscitation attempts   Not sure about tube feeds          Allergies  Allergen Reactions  . Ambien [Zolpidem Tartrate]     [employer]  . Latex Other (See Comments)    REACTION: "anaphylactic shock"  . Ramelteon Other (See Comments)    REACTION: blacks out  . Tetracyclines & Related Anaphylaxis and Other (See Comments)    syncope     Constitutional: Positive headache. Denies fatigue, fever or abrupt weight changes.  HEENT:  Positive ear pain, nasal congestion. Denies eye redness, eye pain, pressure behind the eyes, facial pain, ringing in the ears, wax buildup, runny nose or sore throat. Respiratory:  Denies cough, difficulty breathing or shortness of breath.  Cardiovascular: Denies chest pain, chest tightness, palpitations or swelling in the hands or feet.   No other specific complaints in a complete review of systems (except as listed in HPI above).  Objective:   BP 124/78   Pulse 63   Temp 97.9 F (36.6 C) (Oral)   Wt (!) 306 lb (138.8 kg)   SpO2 99%   BMI 51.71 kg/m  Wt Readings from Last 3 Encounters:  02/22/18 (!) 306 lb (138.8 kg)  04/23/17 Marland Kitchen)  313 lb (142 kg)  04/13/17 (!) 313 lb (142 kg)     General: Appears her stated age, in NAD. HEENT: Head: normal shape and size, frontal sinus tenderness noted;  Ears: Tm's pink but intact, normal light reflex, + serous effusion bilaterally; Nose: mucosa boggy and moist, turbinates swollen, R>L; Throat/Mouth: Teeth present, mucosa pink and moist, no exudate noted, no lesions or ulcerations noted.  Neck: No cervical lymphadenopathy.   Pulmonary/Chest: Normal effort and positive vesicular breath sounds. No respiratory distress. No wheezes, rales or ronchi noted.       Assessment & Plan:   Acute Frontal Sinusitis:  Get some rest and drink plenty of water Can use a Neti Pot OTC eRx for Augmentin BID x 10 days Start Flonase OTC  RTC as needed or if symptoms persist.   Webb Silversmith, NP

## 2018-02-22 NOTE — Patient Instructions (Signed)

## 2018-03-02 ENCOUNTER — Encounter: Payer: Self-pay | Admitting: Cardiovascular Disease

## 2018-03-02 ENCOUNTER — Ambulatory Visit (INDEPENDENT_AMBULATORY_CARE_PROVIDER_SITE_OTHER): Payer: Medicare Other | Admitting: Cardiovascular Disease

## 2018-03-02 VITALS — BP 100/70 | HR 71 | Ht 64.5 in | Wt 309.8 lb

## 2018-03-02 DIAGNOSIS — R002 Palpitations: Secondary | ICD-10-CM | POA: Diagnosis not present

## 2018-03-02 DIAGNOSIS — I5032 Chronic diastolic (congestive) heart failure: Secondary | ICD-10-CM

## 2018-03-02 NOTE — Progress Notes (Signed)
Chief Complaint  Patient presents with  . Follow-up    palpitations    History of Present Illness: 57 yo female with history of DVT, chronic venous insufficiency who is here today for follow up. 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 She had been on coumadin but this was stopped in 2008 by Dr. Albertine Patricia because she was non-compliant and there was no evidence of hypercoagulable state. She has had a negative workup in the hematology clinic. She has also been seen in the past by Dr. Eilleen Kempf in [facility] for vein stripping but she did not have that procedure here in [place]. She had prior vein stripping in [place]. Echo December 2010 with normal LV size and function and no significant valvular disease. Venous dopplers April 2011 with chronic left lower ext DVT, no right LE DVT. She has an IVC filter in place. 21 day event monitor December 2013 did not show any evidence of PACs, PVCs, SVT or atrial fibrillation. Venous dopplers showed chronic, non-occlusive thrombus isolated to the left popliteal vein but no thrombus in the right lower extremity venous system. Echo December 2013 with normal LV size and function, grade 1 diastolic dysfunction, mildly dilated left atrium.   She is here today for follow up. The patient denies any chest pain, dyspnea, palpitations,  orthopnea, PND, dizziness, near syncope or syncope. She has baseline chronic LE edema.   Primary Care Physician: [name], MD   Past Medical History:  Diagnosis Date  . Allergy   . Anemia   . Anxiety   . Chronic venous insufficiency   . Depression   . GERD (gastroesophageal reflux disease)   . Greenfield filter in place   . History of DVT (deep vein thrombosis)   . Hypothyroidism   . Thyroid cancer [facility])     Past Surgical History:  Procedure Laterality Date  . ABDOMINAL HYSTERECTOMY    . BIOPSY THYROID  01/03     suggestive of Hashimoto's  . CESAREAN SECTION  1998  . CHOLECYSTECTOMY  1982  . CRANIECTOMY SUBOCCIPITAL W/ CERVICAL LAMINECTOMY / CHIARI  05/2010   repair, Dr.Pool  . DILATION AND CURETTAGE OF UTERUS  2003  . FETAL SURGERY FOR CONGENITAL HERNIA    . GASTRIC BYPASS    . THYROIDECTOMY  2008   left  . TONSILLECTOMY  1965  . TUBAL LIGATION  1998  . VEIN LIGATION  1995/1998   right leg    Current Outpatient Medications  Medication Sig Dispense Refill  . amoxicillin-clavulanate (AUGMENTIN) 875-125 MG tablet Take 1 tablet by mouth 2 (two) times daily. 20 tablet 0  . Cyanocobalamin (VITAMIN B-12 IJ) Inject 1,000 mcg as directed every 30 (thirty) days.    . cyclobenzaprine (FLEXERIL) 10 MG tablet TAKE 1 TABLET (10 MG TOTAL) BY MOUTH 2 (TWO) TIMES DAILY AS NEEDED FOR MUSCLE SPASMS. 60 tablet 1  . diphenhydramine-acetaminophen (TYLENOL PM) 25-500 MG TABS tablet Take 2 tablets by mouth at bedtime.    Marland Kitchen EPINEPHrine 0.3 mg/0.3 mL IJ SOAJ injection Inject 0.3 mLs (0.3 mg total) into the muscle once. 2 Device 1  . levothyroxine (SYNTHROID, LEVOTHROID) 112 MCG tablet Take 112 mcg by mouth at bedtime.    . methylcellulose (CITRUCEL) oral powder Take 1 packet by mouth as needed.     . Multiple Vitamins-Iron (DAILY MULTIVITAMINS/IRON PO) Take 1 tablet by mouth daily.  No current facility-administered medications for this visit.     Allergies  Allergen Reactions  . Ambien [Zolpidem Tartrate]     [employer]  . Latex Other (See Comments)    REACTION: "anaphylactic shock"  . Ramelteon Other (See Comments)    REACTION: blacks out  . Tetracyclines & Related Anaphylaxis and Other (See Comments)    syncope    Social History   Socioeconomic History  . Marital status: Divorced    Spouse name: Not on file  . Number of children: 3  . Years of education: Not on file  . Highest education level: Not on file  Social Needs  . Financial resource strain: Not on file  . Food insecurity - worry:  Not on file  . Food insecurity - inability: Not on file  . Transportation needs - medical: Not on file  . Transportation needs - non-medical: Not on file  Occupational History  . Occupation: Part time Environmental manager  Tobacco Use  . Smoking status: Never Smoker  . Smokeless tobacco: Never Used  Substance and Sexual Activity  . Alcohol use: Yes    Alcohol/week: 0.0 oz    Comment: rare  . Drug use: No  . Sexual activity: Not on file  Other Topics Concern  . Not on file  Social History Narrative   No living will   Requests mother to make health care decision   Would accept resuscitation attempts   Not sure about tube feeds          Family History  Problem Relation Age of Onset  . Arthritis Mother   . Coronary artery disease Father   . Kidney cancer Father   . Lung cancer Father   . Coronary artery disease Sister   . Ovarian cancer Maternal Grandmother   . Thyroid disease Neg Hx     Review of Systems:  As stated in the HPI and otherwise negative.   BP 100/70   Pulse 71   Ht 5' 4.5" (1.638 m)   Wt (!) 309 lb 12.8 oz (140.5 kg)   SpO2 92%   BMI 52.36 kg/m   Physical Examination:  General: Obese female in NAD HEENT: OP clear, mucus membranes moist  SKIN: warm, dry. No rashes. Neuro: No focal deficits  Musculoskeletal: Muscle strength 5/5 all ext  Psychiatric: Mood and affect normal  Neck: No JVD, no carotid bruits, no thyromegaly, no lymphadenopathy.  Lungs:Clear bilaterally, no wheezes, rhonci, crackles Cardiovascular: Regular rate and rhythm. No murmurs, gallops or rubs. Abdomen:Soft. Bowel sounds present. Non-tender.  Extremities: 1+ bilateral lower extremity edema with chronic venous stasis changes and spider veins.   Echo 12/12/12: Left ventricle: The cavity size was normal. Wall thickness was normal. Systolic function was normal. The estimated ejection fraction was in the range of 55% to 65%. Wall motion was normal; there were no regional wall  motion abnormalities. Doppler parameters are consistent with abnormal left ventricular relaxation (grade 1 diastolic dysfunction). - Left atrium: The atrium was mildly dilated. - Atrial septum: No defect or patent foramen ovale was Identified.  EKG:  EKG is  ordered today. The ekg ordered today demonstrates NSR, rate 71 bpm. Non-specific ST abnormality.   Recent Labs: 04/23/2017: ALT 11; BUN 6; Creatinine, Ser 0.56; Hemoglobin 13.2; Platelets 239.0; Potassium 4.2; Sodium 148; TSH 0.96   Lipid Panel    Component Value Date/Time   CHOL 228 (H) 05/18/2014 0804   TRIG 70.0 05/18/2014 0804   HDL 53.30 05/18/2014 0804  CHOLHDL 4 05/18/2014 0804   VLDL 14.0 05/18/2014 0804   LDLCALC 161 (H) 05/18/2014 0804     Wt Readings from Last 3 Encounters:  03/02/18 (!) 309 lb 12.8 oz (140.5 kg)  02/22/18 (!) 306 lb (138.8 kg)  04/23/17 (!) 313 lb (142 kg)     Other studies Reviewed: Additional studies/ records that were reviewed today include: . Review of the above records demonstrates:    Assessment and Plan:   1. Palpitations: She has no recent palpitations. LV function normal by echo in 2013. No arrythmias noted on 21 day event monitor in 2013..   2. Chronic diastolic CHF: Her fluid status is hard to assess given her size but she seems to be stable. No dyspnea, orthopnea or PND. Will arrange an echo to evaluate LV function.   3. LE edema/history of DVT: Venous dopplers October 2017 with no DVT. IVC filter in place since 2004. She has been off of coumadin for years. She does not wish to restart. She had been followed in Hematology but was released. She has been seen by Dr. Donnetta Hutching in [facility] in April 2018 and no plans for venous ablation.   4. Morbid obesity: Weight loss counseling today. She has had previous gastric bypass surgery. She is considering having a repeat procedure.    Current medicines are reviewed at length with the patient today.  The patient does not have concerns regarding  medicines.  The following changes have been made:  no change  Labs/ tests ordered today include:   Orders Placed This Encounter  Procedures  . EKG 12-Lead  . ECHOCARDIOGRAM COMPLETE    Disposition:   FU with me in 12  months  Signed, Lauree Chandler, MD 03/02/2018 [facility] [place], [place], [place]  [address] Phone: [phone]; Fax: [phone]

## 2018-03-02 NOTE — Patient Instructions (Addendum)

## 2018-03-02 NOTE — Progress Notes (Deleted)
Chief Complaint  Patient presents with  . Follow-up    palpitations    History of Present Illness: 57 yo female with history of DVT, chronic venous insufficiency who is here today for follow up. 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 She had been on coumadin but this was stopped in 2008 by Dr. Albertine Patricia because she was non-compliant and there was no evidence of hypercoagulable state. She has had a negative workup in the hematology clinic. She has also been seen in the past by Dr. Eilleen Kempf in [facility] for vein stripping but she did not have that procedure here in [place]. She had prior vein stripping in [place]. Echo December 2010 with normal LV size and function and no significant valvular disease. Venous dopplers April 2011 with chronic left lower ext DVT, no right LE DVT. She has an IVC filter in place. 21 day event monitor December 2013 did not show any evidence of PACs, PVCs, SVT or atrial fibrillation. Venous dopplers showed chronic, non-occlusive thrombus isolated to the left popliteal vein but no thrombus in the right lower extremity venous system. Echo December 2013 with normal LV size and function, grade 1 diastolic dysfunction, mildly dilated left atrium.   She is here today for follow up. The patient denies any chest pain, dyspnea, palpitations, lower extremity edema, orthopnea, PND, dizziness, near syncope or syncope.   Primary Care Physician: [name], MD   Past Medical History:  Diagnosis Date  . Allergy   . Anemia   . Anxiety   . Chronic venous insufficiency   . Depression   . GERD (gastroesophageal reflux disease)   . Greenfield filter in place   . History of DVT (deep vein thrombosis)   . Hypothyroidism   . Thyroid cancer [facility])     Past Surgical History:  Procedure Laterality Date  . ABDOMINAL HYSTERECTOMY    . BIOPSY THYROID  01/03   suggestive  of Hashimoto's  . CESAREAN SECTION  1998  . CHOLECYSTECTOMY  1982  . CRANIECTOMY SUBOCCIPITAL W/ CERVICAL LAMINECTOMY / CHIARI  05/2010   repair, Dr.Pool  . DILATION AND CURETTAGE OF UTERUS  2003  . FETAL SURGERY FOR CONGENITAL HERNIA    . GASTRIC BYPASS    . THYROIDECTOMY  2008   left  . TONSILLECTOMY  1965  . TUBAL LIGATION  1998  . VEIN LIGATION  1995/1998   right leg    Current Outpatient Medications  Medication Sig Dispense Refill  . amoxicillin-clavulanate (AUGMENTIN) 875-125 MG tablet Take 1 tablet by mouth 2 (two) times daily. 20 tablet 0  . Cyanocobalamin (VITAMIN B-12 IJ) Inject 1,000 mcg as directed every 30 (thirty) days.    . cyclobenzaprine (FLEXERIL) 10 MG tablet TAKE 1 TABLET (10 MG TOTAL) BY MOUTH 2 (TWO) TIMES DAILY AS NEEDED FOR MUSCLE SPASMS. 60 tablet 1  . diphenhydramine-acetaminophen (TYLENOL PM) 25-500 MG TABS tablet Take 2 tablets by mouth at bedtime.    Marland Kitchen EPINEPHrine 0.3 mg/0.3 mL IJ SOAJ injection Inject 0.3 mLs (0.3 mg total) into the muscle once. 2 Device 1  . levothyroxine (SYNTHROID, LEVOTHROID) 112 MCG tablet Take 112 mcg by mouth at bedtime.    . methylcellulose (CITRUCEL) oral powder Take 1 packet by mouth as needed.     . Multiple Vitamins-Iron (DAILY MULTIVITAMINS/IRON PO) Take 1 tablet by mouth daily.      No current facility-administered  medications for this visit.     Allergies  Allergen Reactions  . Ambien [Zolpidem Tartrate]     [employer]  . Latex Other (See Comments)    REACTION: "anaphylactic shock"  . Ramelteon Other (See Comments)    REACTION: blacks out  . Tetracyclines & Related Anaphylaxis and Other (See Comments)    syncope    Social History   Socioeconomic History  . Marital status: Divorced    Spouse name: Not on file  . Number of children: 3  . Years of education: Not on file  . Highest education level: Not on file  Social Needs  . Financial resource strain: Not on file  . Food insecurity - worry: Not on file  .  Food insecurity - inability: Not on file  . Transportation needs - medical: Not on file  . Transportation needs - non-medical: Not on file  Occupational History  . Occupation: Part time Environmental manager  Tobacco Use  . Smoking status: Never Smoker  . Smokeless tobacco: Never Used  Substance and Sexual Activity  . Alcohol use: Yes    Alcohol/week: 0.0 oz    Comment: rare  . Drug use: No  . Sexual activity: Not on file  Other Topics Concern  . Not on file  Social History Narrative   No living will   Requests mother to make health care decision   Would accept resuscitation attempts   Not sure about tube feeds          Family History  Problem Relation Age of Onset  . Arthritis Mother   . Coronary artery disease Father   . Kidney cancer Father   . Lung cancer Father   . Coronary artery disease Sister   . Ovarian cancer Maternal Grandmother   . Thyroid disease Neg Hx     Review of Systems:  As stated in the HPI and otherwise negative.   BP 100/70   Pulse 71   Ht 5' 4.5" (1.638 m)   Wt (!) 309 lb 12.8 oz (140.5 kg)   SpO2 92%   BMI 52.36 kg/m   Physical Examination:  General: Well developed, well nourished, NAD  HEENT: OP clear, mucus membranes moist  SKIN: warm, dry. No rashes. Neuro: No focal deficits  Musculoskeletal: Muscle strength 5/5 all ext  Psychiatric: Mood and affect normal  Neck: No JVD, no carotid bruits, no thyromegaly, no lymphadenopathy.  Lungs:Clear bilaterally, no wheezes, rhonci, crackles Cardiovascular: Regular rate and rhythm. No murmurs, gallops or rubs. Abdomen:Soft. Bowel sounds present. Non-tender.  Extremities: No lower extremity edema. Pulses are 2 + in the bilateral DP/PT.  Echo 12/12/12: Left ventricle: The cavity size was normal. Wall thickness was normal. Systolic function was normal. The estimated ejection fraction was in the range of 55% to 65%. Wall motion was normal; there were no regional wall motion abnormalities.  Doppler parameters are consistent with abnormal left ventricular relaxation (grade 1 diastolic dysfunction). - Left atrium: The atrium was mildly dilated. - Atrial septum: No defect or patent foramen ovale was Identified.  EKG:  EKG is  ordered today. The ekg ordered today demonstrates NSR, rate 71 bpm. Non-specific ST abnormality.   Recent Labs: 04/23/2017: ALT 11; BUN 6; Creatinine, Ser 0.56; Hemoglobin 13.2; Platelets 239.0; Potassium 4.2; Sodium 148; TSH 0.96   Lipid Panel    Component Value Date/Time   CHOL 228 (H) 05/18/2014 0804   TRIG 70.0 05/18/2014 0804   HDL 53.30 05/18/2014 0804   CHOLHDL 4 05/18/2014  0804   VLDL 14.0 05/18/2014 0804   LDLCALC 161 (H) 05/18/2014 0804     Wt Readings from Last 3 Encounters:  03/02/18 (!) 309 lb 12.8 oz (140.5 kg)  02/22/18 (!) 306 lb (138.8 kg)  04/23/17 (!) 313 lb (142 kg)     Other studies Reviewed: Additional studies/ records that were reviewed today include: . Review of the above records demonstrates:    Assessment and Plan:   1. Palpitations: She has no recent palpitations. LV function normal by echo in 2013. No arrythmias noted on 21 day event monitor in 2013..   2. LE edema/history of DVT/left lower ext pain: Venous dopplers October 2017 with no DVT. IVC filter in place since 2004. She has been off of coumadin for years. She does not wish to restart. She had been followed in Hematology but was released. She has been seen by Dr. Donnetta Hutching in [facility] in April 2018 and no plans for venous ablation.   3. Morbid obesity: Weight loss counseling today. She has had previous gastric bypass surgery  *** add echo  Current medicines are reviewed at length with the patient today.  The patient does not have concerns regarding medicines.  The following changes have been made:  no change  Labs/ tests ordered today include:   No orders of the defined types were placed in this encounter.   Disposition:   FU with me in 12   months  Signed, Lauree Chandler, MD 03/02/2018 2:51 PM    [facility] [place], [place], [place]  [address] Phone: [phone]; Fax: [phone]

## 2018-03-05 ENCOUNTER — Other Ambulatory Visit: Payer: Self-pay | Admitting: Internal Medicine

## 2018-03-09 ENCOUNTER — Other Ambulatory Visit (HOSPITAL_COMMUNITY): Payer: Medicare Other

## 2018-03-10 ENCOUNTER — Other Ambulatory Visit: Payer: Self-pay

## 2018-03-10 ENCOUNTER — Ambulatory Visit (INDEPENDENT_AMBULATORY_CARE_PROVIDER_SITE_OTHER): Payer: Medicare Other | Admitting: Family Medicine

## 2018-03-10 ENCOUNTER — Encounter: Payer: Self-pay | Admitting: Family Medicine

## 2018-03-10 VITALS — BP 100/70 | HR 73 | Temp 98.0°F | Ht 64.5 in | Wt 310.5 lb

## 2018-03-10 DIAGNOSIS — M545 Low back pain: Secondary | ICD-10-CM | POA: Diagnosis not present

## 2018-03-10 DIAGNOSIS — M79604 Pain in right leg: Secondary | ICD-10-CM

## 2018-03-10 MED ORDER — PREDNISONE 20 MG PO TABS: ORAL_TABLET | ORAL | 0 refills | Status: DC

## 2018-03-10 MED ORDER — TRAMADOL HCL 50 MG PO TABS: 50.0000 mg | ORAL_TABLET | Freq: Two times a day (BID) | ORAL | 0 refills | Status: DC | PRN

## 2018-03-10 NOTE — Patient Instructions (Addendum)
Start heat, low back stretching. Complete prednisone taper. Tramadol for breakthrough pain.  Use muscle relaxant at bedtime.  if still low back pain after predisone completed.. Can use ibuprofen 800 mg every 8 hours for pain and inflammation.  Call if not improivng in 2 weeks.

## 2018-03-10 NOTE — Progress Notes (Signed)
Subjective:    Patient ID: [name], female    DOB: 02/10/1961, 57 y.o.   MRN: [id number]  Back Pain  This is a new problem. The current episode started in the past 7 days. The problem occurs constantly. The problem has been rapidly worsening since onset. The pain is present in the lumbar spine. The quality of the pain is described as stabbing. The pain radiates to the right thigh ( right buttock). The pain is severe. The pain is the same all the time. The symptoms are aggravated by bending. Stiffness is present all day. Associated symptoms include leg pain and weakness. Pertinent negatives include no abdominal pain, bladder incontinence, bowel incontinence, dysuria, fever, numbness, pelvic pain, perianal numbness or tingling. Risk factors include lack of exercise, obesity and poor posture. She has tried heat ( tramafdol , tylenol and cyclobenzaprine) for the symptoms. The treatment provided mild relief.   Sudden onset when tyurning.  Uses cyclobenzaprine for legs.Marland Kitchen Has helped her back.   Hx morbidly obese  chiari malformation.Niel Hummer place in past.. But ever since has had low back pain   no recent falls  Review of Systems  Constitutional: Negative for fever.  Gastrointestinal: Negative for abdominal pain and bowel incontinence.  Genitourinary: Negative for bladder incontinence, dysuria and pelvic pain.  Musculoskeletal: Positive for back pain.  Neurological: Positive for weakness. Negative for tingling and numbness.       Objective:   Physical Exam  Constitutional: Vital signs are normal. She appears well-developed and well-nourished. She is cooperative.  Non-toxic appearance. She does not appear ill. No distress.  Morbidly obese  HENT:  Head: Normocephalic.  Right Ear: Hearing, tympanic membrane, external ear and ear canal normal. Tympanic membrane is not erythematous, not retracted and not bulging.  Left Ear: Hearing, tympanic membrane, external ear and ear canal normal.  Tympanic membrane is not erythematous, not retracted and not bulging.  Nose: No mucosal edema or rhinorrhea. Right sinus exhibits no maxillary sinus tenderness and no frontal sinus tenderness. Left sinus exhibits no maxillary sinus tenderness and no frontal sinus tenderness.  Mouth/Throat: Uvula is midline, oropharynx is clear and moist and mucous membranes are normal.  Eyes: Pupils are equal, round, and reactive to light. Conjunctivae, EOM and lids are normal. Lids are everted and swept, no foreign bodies found.  Neck: Trachea normal and normal range of motion. Neck supple. Carotid bruit is not present. No thyroid mass and no thyromegaly present.  Cardiovascular: Normal rate, regular rhythm, S1 normal, S2 normal, normal heart sounds, intact distal pulses and normal pulses. Exam reveals no gallop and no friction rub.  No murmur heard. Pulmonary/Chest: Effort normal and breath sounds normal. No tachypnea. No respiratory distress. She has no decreased breath sounds. She has no wheezes. She has no rhonchi. She has no rales.  Abdominal: Soft. Normal appearance and bowel sounds are normal. There is no tenderness.  Musculoskeletal:       Lumbar back: She exhibits decreased range of motion and tenderness. She exhibits no bony tenderness.   Positive SLR on right, neg Faber'a   ttp in right sciatic notch as well as right paraspinous muscle  Neurological: She is alert.  Skin: Skin is warm, dry and intact. No rash noted.  Psychiatric: Her speech is normal and behavior is normal. Judgment and thought content normal. Her mood appears not anxious. Cognition and memory are normal. She does not exhibit a depressed mood.          Assessment &  Plan:

## 2018-03-10 NOTE — Assessment & Plan Note (Signed)
Treat with pred taper, muscle relaxant. Heat, and home stretching.  tramadol for break through pain.

## 2018-03-11 ENCOUNTER — Other Ambulatory Visit: Payer: Self-pay

## 2018-03-11 ENCOUNTER — Ambulatory Visit (HOSPITAL_COMMUNITY): Payer: Medicare Other | Attending: Cardiovascular Disease

## 2018-03-11 DIAGNOSIS — I5032 Chronic diastolic (congestive) heart failure: Secondary | ICD-10-CM | POA: Insufficient documentation

## 2018-03-11 DIAGNOSIS — R600 Localized edema: Secondary | ICD-10-CM | POA: Diagnosis not present

## 2018-03-11 DIAGNOSIS — E039 Hypothyroidism, unspecified: Secondary | ICD-10-CM | POA: Insufficient documentation

## 2018-03-11 DIAGNOSIS — I110 Hypertensive heart disease with heart failure: Secondary | ICD-10-CM | POA: Insufficient documentation

## 2018-04-04 ENCOUNTER — Other Ambulatory Visit: Payer: Self-pay | Admitting: Internal Medicine

## 2018-04-04 DIAGNOSIS — R1319 Other dysphagia: Secondary | ICD-10-CM | POA: Diagnosis not present

## 2018-04-04 DIAGNOSIS — Z6841 Body Mass Index (BMI) 40.0 and over, adult: Secondary | ICD-10-CM | POA: Diagnosis not present

## 2018-04-04 DIAGNOSIS — K5900 Constipation, unspecified: Secondary | ICD-10-CM | POA: Diagnosis not present

## 2018-04-04 DIAGNOSIS — R1310 Dysphagia, unspecified: Secondary | ICD-10-CM | POA: Diagnosis not present

## 2018-04-04 DIAGNOSIS — Z9884 Bariatric surgery status: Secondary | ICD-10-CM | POA: Diagnosis not present

## 2018-04-04 DIAGNOSIS — K219 Gastro-esophageal reflux disease without esophagitis: Secondary | ICD-10-CM | POA: Diagnosis not present

## 2018-04-04 NOTE — Telephone Encounter (Signed)
Approved: okay #[phone] x 1

## 2018-04-12 ENCOUNTER — Encounter: Payer: Self-pay | Admitting: Internal Medicine

## 2018-04-13 MED ORDER — OMEPRAZOLE 20 MG PO CPDR: 20.0000 mg | DELAYED_RELEASE_CAPSULE | Freq: Every day | ORAL | 3 refills | Status: DC

## 2018-04-19 DIAGNOSIS — R1319 Other dysphagia: Secondary | ICD-10-CM | POA: Diagnosis not present

## 2018-04-19 DIAGNOSIS — K219 Gastro-esophageal reflux disease without esophagitis: Secondary | ICD-10-CM | POA: Diagnosis not present

## 2018-04-19 DIAGNOSIS — Z79899 Other long term (current) drug therapy: Secondary | ICD-10-CM | POA: Diagnosis not present

## 2018-04-19 DIAGNOSIS — R12 Heartburn: Secondary | ICD-10-CM | POA: Diagnosis not present

## 2018-04-19 DIAGNOSIS — Z9049 Acquired absence of other specified parts of digestive tract: Secondary | ICD-10-CM | POA: Diagnosis not present

## 2018-04-19 DIAGNOSIS — Z9889 Other specified postprocedural states: Secondary | ICD-10-CM | POA: Diagnosis not present

## 2018-04-19 DIAGNOSIS — R1310 Dysphagia, unspecified: Secondary | ICD-10-CM | POA: Diagnosis not present

## 2018-04-19 DIAGNOSIS — K5900 Constipation, unspecified: Secondary | ICD-10-CM | POA: Diagnosis not present

## 2018-04-19 DIAGNOSIS — K449 Diaphragmatic hernia without obstruction or gangrene: Secondary | ICD-10-CM | POA: Diagnosis not present

## 2018-04-19 DIAGNOSIS — Z9884 Bariatric surgery status: Secondary | ICD-10-CM | POA: Diagnosis not present

## 2018-04-19 DIAGNOSIS — K9589 Other complications of other bariatric procedure: Secondary | ICD-10-CM | POA: Diagnosis not present

## 2018-05-03 LAB — LIPID PANEL
CHOLESTEROL: 244 — AB (ref 0–200)
HDL: 56 (ref 35–70)
LDL Cholesterol: 160
Triglycerides: 141 (ref 40–160)

## 2018-05-03 LAB — VITAMIN B12: Vitamin B-12: 98

## 2018-05-03 LAB — HEMOGLOBIN A1C: Hemoglobin A1C: 5.6

## 2018-05-03 LAB — VITAMIN D 25 HYDROXY (VIT D DEFICIENCY, FRACTURES): VIT D 25 HYDROXY: 9

## 2018-05-05 ENCOUNTER — Encounter: Payer: Self-pay | Admitting: Internal Medicine

## 2018-05-13 DIAGNOSIS — E039 Hypothyroidism, unspecified: Secondary | ICD-10-CM | POA: Diagnosis not present

## 2018-05-13 DIAGNOSIS — Z9884 Bariatric surgery status: Secondary | ICD-10-CM | POA: Insufficient documentation

## 2018-05-13 DIAGNOSIS — Q054 Unspecified spina bifida with hydrocephalus: Secondary | ICD-10-CM | POA: Diagnosis not present

## 2018-05-18 DIAGNOSIS — G4733 Obstructive sleep apnea (adult) (pediatric): Secondary | ICD-10-CM | POA: Diagnosis not present

## 2018-05-24 ENCOUNTER — Encounter: Payer: Self-pay | Admitting: Cardiovascular Disease

## 2018-05-30 ENCOUNTER — Other Ambulatory Visit: Payer: Self-pay | Admitting: Internal Medicine

## 2018-06-06 ENCOUNTER — Other Ambulatory Visit: Payer: Self-pay | Admitting: Internal Medicine

## 2018-06-07 NOTE — Telephone Encounter (Signed)
Approved: #[phone] x 3 

## 2018-06-14 DIAGNOSIS — Z9884 Bariatric surgery status: Secondary | ICD-10-CM | POA: Diagnosis not present

## 2018-06-14 DIAGNOSIS — E611 Iron deficiency: Secondary | ICD-10-CM | POA: Diagnosis not present

## 2018-06-17 ENCOUNTER — Telehealth: Payer: Self-pay | Admitting: Hematology and Oncology

## 2018-06-17 NOTE — Telephone Encounter (Signed)
Returned call to patient as she was requesting appointment with Dr Lindi Adie.  Per 6/28  staff message from Dr Lindi Adie ok to schedule.

## 2018-06-21 ENCOUNTER — Inpatient Hospital Stay: Payer: Medicare Other | Attending: Hematology and Oncology | Admitting: Hematology and Oncology

## 2018-06-21 ENCOUNTER — Telehealth: Payer: Self-pay | Admitting: Hematology and Oncology

## 2018-06-21 DIAGNOSIS — D508 Other iron deficiency anemias: Secondary | ICD-10-CM | POA: Diagnosis not present

## 2018-06-21 DIAGNOSIS — Z9884 Bariatric surgery status: Secondary | ICD-10-CM | POA: Insufficient documentation

## 2018-06-21 DIAGNOSIS — Z86718 Personal history of other venous thrombosis and embolism: Secondary | ICD-10-CM | POA: Insufficient documentation

## 2018-06-21 DIAGNOSIS — E538 Deficiency of other specified B group vitamins: Secondary | ICD-10-CM | POA: Insufficient documentation

## 2018-06-21 MED ORDER — CYANOCOBALAMIN 1000 MCG/ML IJ SOLN: 1000.0000 ug | INTRAMUSCULAR | 6 refills | Status: DC

## 2018-06-21 MED ORDER — NEEDLES & SYRINGES MISC: 1.0000 | 6 refills | Status: DC

## 2018-06-21 NOTE — Assessment & Plan Note (Signed)
Due to gastric bypass surgery. Patient gives herself B-12 supplementation once a month 1000 micrograms  B12 levels May 2019: 98 I discussed with the patient that the B12 levels are profoundly low in spite of monthly B12 injections.  Because of this I recommended that she change B12 injections to every 2 weeks.  We will recheck her B12 levels in a year and follow-up

## 2018-06-21 NOTE — Progress Notes (Signed)
Patient Care Team: [name], MD as PCP - General Angelena Form Annita Brod, MD as PCP - Cardiology (Cardiology)  DIAGNOSIS:  Encounter Diagnoses  Name Primary?  . DVT, HX OF   . Vitamin B 12 deficiency   . Other iron deficiency anemia     CHIEF COMPLIANT: Patient is coming back to see [place] after 3 years with profound iron deficiency and B12 deficiencies.  INTERVAL HISTORY: [name] is a 57 year old lady with gastric bypass surgery who had seen 3 years ago when she had B12 and iron deficiencies.  For the B12 deficiency we started on B12 injections.  She has been doing it religiously every week.  Most recent blood work revealed B12 level of 96.  She was also found to be iron deficient.  She had unfortunately gained a lot of weight in spite of the gastric bypass surgery and is planning to go through another surgery soon.  And she is getting prepared for this neck surgery by making sure that her deficiencies have been addressed. She feels extremely short of breath to minimal exertion.  This is in spite of the hemoglobin being relatively stable.  REVIEW OF SYSTEMS:   Constitutional: Denies fevers, chills or abnormal weight loss Eyes: Denies blurriness of vision Ears, nose, mouth, throat, and face: Denies mucositis or sore throat Respiratory: Denies cough, dyspnea or wheezes Cardiovascular: Denies palpitation, chest discomfort Gastrointestinal:  Denies nausea, heartburn or change in bowel habits Skin: Denies abnormal skin rashes Lymphatics: Denies new lymphadenopathy or easy bruising Neurological:Denies numbness, tingling or new weaknesses Behavioral/Psych: Mood is stable, no new changes  Extremities: No lower extremity edema  All other systems were reviewed with the patient and are negative.  I have reviewed the past medical history, past surgical history, social history and family history with the patient and they are unchanged from previous note.  ALLERGIES:  is  allergic to [employer] tartrate]; latex; ramelteon; and tetracyclines & related.  MEDICATIONS:  Current Outpatient Medications  Medication Sig Dispense Refill  . Cyanocobalamin (VITAMIN B-12 IJ) Inject 1,000 mcg as directed every 30 (thirty) days.    . cyclobenzaprine (FLEXERIL) 10 MG tablet TAKE 1 TABLET (10 MG TOTAL) BY MOUTH 2 (TWO) TIMES DAILY AS NEEDED FOR MUSCLE SPASMS. 60 tablet 3  . diphenhydramine-acetaminophen (TYLENOL PM) 25-500 MG TABS tablet Take 2 tablets by mouth at bedtime.    Marland Kitchen EPINEPHrine 0.3 mg/0.3 mL IJ SOAJ injection Inject 0.3 mLs (0.3 mg total) into the muscle once. 2 Device 1  . levothyroxine (SYNTHROID, LEVOTHROID) 112 MCG tablet Take 112 mcg by mouth at bedtime.    Marland Kitchen levothyroxine (SYNTHROID, LEVOTHROID) 112 MCG tablet Take 1 tablet (112 mcg total) by mouth daily before breakfast. NEEDS COMPLETE PHYSICAL EXAM 90 tablet 0  . methylcellulose (CITRUCEL) oral powder Take 1 packet by mouth as needed.     . Multiple Vitamins-Iron (DAILY MULTIVITAMINS/IRON PO) Take 1 tablet by mouth daily.     Marland Kitchen omeprazole (PRILOSEC) 20 MG capsule Take 1 capsule (20 mg total) by mouth daily. 90 capsule 3  . predniSONE (DELTASONE) 20 MG tablet 3 tabs by mouth daily x 3 days, then 2 tabs by mouth daily x 2 days then 1 tab by mouth daily x 2 days 15 tablet 0  . traMADol (ULTRAM) 50 MG tablet Take 1 tablet (50 mg total) by mouth every 12 (twelve) hours as needed. 30 tablet 0   No current facility-administered medications for this visit.     PHYSICAL EXAMINATION:  ECOG PERFORMANCE STATUS: 1 - Symptomatic but completely ambulatory  Vitals:   06/21/18 1458  BP: 99/63  Pulse: 83  Resp: 18  Temp: 98.2 F (36.8 C)  SpO2: 99%   Filed Weights   06/21/18 1458  Weight: (!) 311 lb 4.8 oz (141.2 kg)    GENERAL:alert, no distress and comfortable SKIN: skin color, texture, turgor are normal, no rashes or significant lesions EYES: normal, Conjunctiva are pink and non-injected, sclera  clear OROPHARYNX:no exudate, no erythema and lips, buccal mucosa, and tongue normal  NECK: supple, thyroid normal size, non-tender, without nodularity LYMPH:  no palpable lymphadenopathy in the cervical, axillary or inguinal LUNGS: clear to auscultation and percussion with normal breathing effort HEART: regular rate & rhythm and no murmurs and no lower extremity edema ABDOMEN:abdomen soft, non-tender and normal bowel sounds MUSCULOSKELETAL:no cyanosis of digits and no clubbing  NEURO: alert & oriented x 3 with fluent speech, no focal motor/sensory deficits EXTREMITIES: No lower extremity edema  LABORATORY DATA:  I have reviewed the data as listed CMP Latest Ref Rng & Units 04/23/2017 09/23/2016 03/15/2015  Glucose 70 - 99 mg/dL 100(H) 99 98  BUN 6 - 23 mg/dL 6 <5(L) 5.5(L)  Creatinine 0.40 - 1.20 mg/dL 0.56 0.55 0.6  Sodium 135 - 145 mEq/L 148(H) 141 143  Potassium 3.5 - 5.1 mEq/L 4.2 3.6 3.8  Chloride 96 - 112 mEq/L 113(H) 107 -  CO2 19 - 32 mEq/L 29 25 28   Calcium 8.4 - 10.5 mg/dL 9.0 8.5(L) 8.5  Total Protein 6.0 - 8.3 g/dL 7.1 - 6.3(L)  Total Bilirubin 0.2 - 1.2 mg/dL 1.0 - 1.36(H)  Alkaline Phos 39 - 117 U/L 140(H) - 146  AST 0 - 37 U/L 14 - 16  ALT 0 - 35 U/L 11 - 15    Lab Results  Component Value Date   WBC 5.4 04/23/2017   HGB 13.2 04/23/2017   HCT 39.5 04/23/2017   MCV 94.5 04/23/2017   PLT 239.0 04/23/2017   NEUTROABS 2.4 04/23/2017    ASSESSMENT & PLAN:  DVT, HX OF 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 No further problems with blood clots at this time.  Vitamin B 12 deficiency Due to gastric bypass surgery. Patient gives herself B-12 supplementation once a month 1000 micrograms  B12 levels May 2019: 98 I discussed with the patient that the B12 levels are profoundly low in spite of monthly B12 injections.  Because of this I recommended that she change B12 injections to once a week for the next 2  months.  After that we can go to every 2 weeks..  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 Because of this I recommended that she get IV iron therapy.  Return to clinic in 3 months with labs and follow-up.  No orders of the defined types were placed in this encounter.  The patient has a good understanding of the overall plan. she agrees with it. she will call with any problems that may develop before the next visit here.   Harriette Ohara, MD 06/21/18

## 2018-06-21 NOTE — Telephone Encounter (Signed)
Patient declined avs and calendar of upcoming appts.

## 2018-06-21 NOTE — Assessment & Plan Note (Signed)
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 No further problems with blood clots at this time.

## 2018-06-21 NOTE — Assessment & Plan Note (Signed)
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

## 2018-06-27 ENCOUNTER — Inpatient Hospital Stay: Payer: Medicare Other

## 2018-06-27 VITALS — BP 108/63 | HR 57 | Temp 98.1°F | Resp 17

## 2018-06-27 DIAGNOSIS — D508 Other iron deficiency anemias: Secondary | ICD-10-CM

## 2018-06-27 DIAGNOSIS — Z9884 Bariatric surgery status: Secondary | ICD-10-CM | POA: Diagnosis not present

## 2018-06-27 DIAGNOSIS — E538 Deficiency of other specified B group vitamins: Secondary | ICD-10-CM | POA: Diagnosis not present

## 2018-06-27 DIAGNOSIS — Z86718 Personal history of other venous thrombosis and embolism: Secondary | ICD-10-CM | POA: Diagnosis not present

## 2018-06-27 MED ORDER — SODIUM CHLORIDE 0.9 % IV SOLN
Freq: Once | INTRAVENOUS | Status: AC
  Administered 2018-06-27: 09:00:00 via INTRAVENOUS

## 2018-06-27 MED ORDER — SODIUM CHLORIDE 0.9 % IV SOLN
510.0000 mg | Freq: Once | INTRAVENOUS | Status: AC
  Administered 2018-06-27: 510 mg via INTRAVENOUS
  Filled 2018-06-27: qty 17

## 2018-06-27 NOTE — Patient Instructions (Signed)

## 2018-06-30 DIAGNOSIS — F4323 Adjustment disorder with mixed anxiety and depressed mood: Secondary | ICD-10-CM | POA: Diagnosis not present

## 2018-07-04 ENCOUNTER — Inpatient Hospital Stay: Payer: Medicare Other

## 2018-07-04 ENCOUNTER — Other Ambulatory Visit: Payer: Self-pay | Admitting: Medical

## 2018-07-04 ENCOUNTER — Inpatient Hospital Stay (HOSPITAL_BASED_OUTPATIENT_CLINIC_OR_DEPARTMENT_OTHER): Payer: Medicare Other | Admitting: Medical

## 2018-07-04 VITALS — BP 103/77 | HR 73 | Temp 97.9°F | Resp 16

## 2018-07-04 DIAGNOSIS — D509 Iron deficiency anemia, unspecified: Secondary | ICD-10-CM

## 2018-07-04 DIAGNOSIS — Z9884 Bariatric surgery status: Secondary | ICD-10-CM | POA: Diagnosis not present

## 2018-07-04 DIAGNOSIS — R110 Nausea: Secondary | ICD-10-CM

## 2018-07-04 DIAGNOSIS — Z86718 Personal history of other venous thrombosis and embolism: Secondary | ICD-10-CM | POA: Diagnosis not present

## 2018-07-04 DIAGNOSIS — R197 Diarrhea, unspecified: Secondary | ICD-10-CM

## 2018-07-04 DIAGNOSIS — D508 Other iron deficiency anemias: Secondary | ICD-10-CM | POA: Diagnosis not present

## 2018-07-04 DIAGNOSIS — E538 Deficiency of other specified B group vitamins: Secondary | ICD-10-CM | POA: Diagnosis not present

## 2018-07-04 MED ORDER — ACETAMINOPHEN 325 MG PO TABS
650.0000 mg | ORAL_TABLET | Freq: Once | ORAL | Status: AC
  Administered 2018-07-04: 650 mg via ORAL

## 2018-07-04 MED ORDER — ACETAMINOPHEN 325 MG PO TABS
ORAL_TABLET | ORAL | Status: AC
  Filled 2018-07-04: qty 2

## 2018-07-04 MED ORDER — ACETAMINOPHEN 325 MG PO TABS: 650.0000 mg | ORAL_TABLET | Freq: Once | ORAL | Status: DC

## 2018-07-04 MED ORDER — PROCHLORPERAZINE MALEATE 5 MG PO TABS: 5.0000 mg | ORAL_TABLET | Freq: Four times a day (QID) | ORAL | 0 refills | Status: AC | PRN

## 2018-07-04 MED ORDER — DIPHENOXYLATE-ATROPINE 2.5-0.025 MG PO TABS: 1.0000 | ORAL_TABLET | Freq: Four times a day (QID) | ORAL | 0 refills | Status: DC | PRN

## 2018-07-04 MED ORDER — SODIUM CHLORIDE 0.9 % IV SOLN
Freq: Once | INTRAVENOUS | Status: AC
  Administered 2018-07-04: 15:00:00 via INTRAVENOUS

## 2018-07-04 MED ORDER — SODIUM CHLORIDE 0.9 % IV SOLN
510.0000 mg | Freq: Once | INTRAVENOUS | Status: AC
  Administered 2018-07-04: 510 mg via INTRAVENOUS
  Filled 2018-07-04: qty 17

## 2018-07-04 NOTE — Progress Notes (Signed)
Pt reports after last Feraheme infusion, she "felt terrible for a few days." Pt states the hand with the IV went cold then numb, and she felt a little off while she was here getting the infusion.   Pt reports Monday night after infusion she had a terrible stomachache, and Tuesday beginning around 0400 she had multiple episodes of diarrhea throughout the day. Pt reports she slept most of the day while she was not using the restroom.  Sandi Mealy, PA in to see pt; adding 650mg  Tylenol premeds.

## 2018-07-04 NOTE — Patient Instructions (Addendum)
[place]  Today you received the following agents: Feraheme. To help prevent nausea and vomiting after your treatment, we encourage you to take your nausea medication as prescribed. Feel free to call the clinic should you have any questions or concerns. The clinic phone number is [phone].  Ferumoxytol injection (Feraheme) What is this medicine? FERUMOXYTOL is an iron complex. Iron is used to make healthy red blood cells, which carry oxygen and nutrients throughout the body. This medicine is used to treat iron deficiency anemia in people with chronic kidney disease. This medicine may be used for other purposes; ask your health care provider or pharmacist if you have questions. COMMON BRAND NAME(S): Feraheme What should I tell my health care provider before I take this medicine? They need to know if you have any of these conditions: -anemia not caused by low iron levels -high levels of iron in the blood -magnetic resonance imaging (MRI) test scheduled -an unusual or allergic reaction to iron, other medicines, foods, dyes, or preservatives -pregnant or trying to get pregnant -breast-feeding How should I use this medicine? This medicine is for injection into a vein. It is given by a health care professional in a hospital or clinic setting. Talk to your pediatrician regarding the use of this medicine in children. Special care may be needed. Overdosage: If you think you have taken too much of this medicine contact a poison control center or emergency room at once. NOTE: This medicine is only for you. Do not share this medicine with others. What if I miss a dose? It is important not to miss your dose. Call your doctor or health care professional if you are unable to keep an appointment. What may interact with this medicine? This medicine may interact with the following medications: -other iron products This list may not describe all possible interactions. Give your health  care provider a list of all the medicines, herbs, non-prescription drugs, or dietary supplements you use. Also tell them if you smoke, drink alcohol, or use illegal drugs. Some items may interact with your medicine. What should I watch for while using this medicine? Visit your doctor or healthcare professional regularly. Tell your doctor or healthcare professional if your symptoms do not start to get better or if they get worse. You may need blood work done while you are taking this medicine. You may need to follow a special diet. Talk to your doctor. Foods that contain iron include: whole grains/cereals, dried fruits, beans, or peas, leafy green vegetables, and organ meats (liver, kidney). What side effects may I notice from receiving this medicine? Side effects that you should report to your doctor or health care professional as soon as possible: -allergic reactions like skin rash, itching or hives, swelling of the face, lips, or tongue -breathing problems -changes in blood pressure -feeling faint or lightheaded, falls -fever or chills -flushing, sweating, or hot feelings -swelling of the ankles or feet Side effects that usually do not require medical attention (report to your doctor or health care professional if they continue or are bothersome): -diarrhea -headache -nausea, vomiting -stomach pain This list may not describe all possible side effects. Call your doctor for medical advice about side effects. You may report side effects to FDA at 1-800-FDA-1088. Where should I keep my medicine? This drug is given in a hospital or clinic and will not be stored at home. NOTE: This sheet is a summary. It may not cover all possible information. If you have questions about  this medicine, talk to your doctor, pharmacist, or health care provider.  2018 Elsevier/Gold Standard (2016-01-09 12:41:49)

## 2018-07-05 NOTE — Progress Notes (Signed)
The patient was seen in the infusion room today.  She reports that she had mild nausea after receiving her last infusion of Feraheme.  She had approximately 24 hours of diarrhea.  She used Imodium without benefit.  A prescription for Lomotil and Compazine will be sent to her pharmacy today.  The patient also will be given Tylenol 650 mg prior to her infusion of Feraheme.  Sandi Mealy, MHS, PA-C Physician Assistant

## 2018-07-11 DIAGNOSIS — Z9884 Bariatric surgery status: Secondary | ICD-10-CM | POA: Diagnosis not present

## 2018-07-29 NOTE — Telephone Encounter (Signed)
See additional mychart message. Per Leafy Ro, pt will need to resend message in pts chart instead of her own.

## 2018-08-27 ENCOUNTER — Other Ambulatory Visit: Payer: Self-pay | Admitting: Internal Medicine

## 2018-09-18 ENCOUNTER — Other Ambulatory Visit: Payer: Self-pay

## 2018-09-18 ENCOUNTER — Ambulatory Visit (HOSPITAL_COMMUNITY)
Admission: EM | Admit: 2018-09-18 | Discharge: 2018-09-18 | Disposition: A | Discharge status: Home / Self Care | Payer: Medicare Other | Attending: Internal Medicine | Admitting: Internal Medicine

## 2018-09-18 ENCOUNTER — Encounter (HOSPITAL_COMMUNITY): Payer: Self-pay

## 2018-09-18 DIAGNOSIS — N764 Abscess of vulva: Secondary | ICD-10-CM | POA: Diagnosis not present

## 2018-09-18 MED ORDER — TRAMADOL HCL 50 MG PO TABS: 50.0000 mg | ORAL_TABLET | Freq: Two times a day (BID) | ORAL | 0 refills | Status: DC | PRN

## 2018-09-18 MED ORDER — SULFAMETHOXAZOLE-TRIMETHOPRIM 800-160 MG PO TABS: 1.0000 | ORAL_TABLET | Freq: Two times a day (BID) | ORAL | 0 refills | Status: AC

## 2018-09-18 NOTE — ED Provider Notes (Signed)
[facility]   [id number] 09/18/18 Arrival Time: 2426   ST:[id number]  SUBJECTIVE:  [name] is a 57 y.o. female who presents with a possible abscess of her right labia. Onset abrupt, approximately 1 day ago. Denies precipitating event.  Has not tried OTC medication.  Worse with sitting and wearing underwear.  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  Denies redness, swelling, fever, chills, nausea or vomiting.    ROS: As per HPI.  Past Medical History:  Diagnosis Date  . Allergy   . Anemia   . Anxiety   . Chronic venous insufficiency   . Depression   . GERD (gastroesophageal reflux disease)   . [place] filter in place   . History of DVT (deep vein thrombosis)   . Hypothyroidism   . Thyroid cancer [facility])    Past Surgical History:  Procedure Laterality Date  . ABDOMINAL HYSTERECTOMY    . BIOPSY THYROID  01/03   suggestive of Hashimoto's  . CESAREAN SECTION  1998  . CHOLECYSTECTOMY  1982  . CRANIECTOMY SUBOCCIPITAL W/ CERVICAL LAMINECTOMY / CHIARI  05/2010   repair, Dr.Pool  . DILATION AND CURETTAGE OF UTERUS  2003  . FETAL SURGERY FOR CONGENITAL HERNIA    . GASTRIC BYPASS    . THYROIDECTOMY  2008   left  . TONSILLECTOMY  1965  . TUBAL LIGATION  1998  . VEIN LIGATION  1995/1998   right leg   Allergies  Allergen Reactions  . Ambien [Zolpidem Tartrate]     [employer]  . Latex Other (See Comments)    REACTION: "anaphylactic shock"  . Ramelteon Other (See Comments)    REACTION: blacks out  . Tetracyclines & Related Anaphylaxis and Other (See Comments)    syncope   No current facility-administered medications on file prior to encounter.    Current Outpatient Medications on File Prior to Encounter  Medication Sig Dispense Refill  . diphenhydramine-acetaminophen (TYLENOL PM) 25-500 MG TABS tablet Take 2 tablets by mouth at bedtime.    Marland Kitchen EPINEPHrine 0.3 mg/0.3 mL IJ SOAJ injection Inject 0.3 mLs (0.3 mg total) into the muscle once. 2 Device 1  . levothyroxine  (SYNTHROID, LEVOTHROID) 112 MCG tablet Take 112 mcg by mouth at bedtime.    Marland Kitchen levothyroxine (SYNTHROID, LEVOTHROID) 112 MCG tablet TAKE 1 TABLET (112 MCG TOTAL) BY MOUTH DAILY BEFORE BREAKFAST. NEEDS COMPLETE PHYSICAL EXAM 90 tablet 0  . methylcellulose (CITRUCEL) oral powder Take 1 packet by mouth as needed.     . Multiple Vitamins-Iron (DAILY MULTIVITAMINS/IRON PO) Take 1 tablet by mouth daily.     Marland Kitchen omeprazole (PRILOSEC) 20 MG capsule Take 1 capsule (20 mg total) by mouth daily. 90 capsule 3  . prochlorperazine (COMPAZINE) 5 MG tablet Take 1 tablet (5 mg total) by mouth every 6 (six) hours as needed for nausea or vomiting. 30 tablet 0   Social History   Socioeconomic History  . Marital status: Divorced    Spouse name: Not on file  . Number of children: 3  . Years of education: Not on file  . Highest education level: Not on file  Occupational History  . Occupation: Part time Environmental manager  Social Needs  . Financial resource strain: Not on file  . Food insecurity:    Worry: Not on file    Inability: Not on file  . Transportation needs:    Medical: Not on file    Non-medical: Not on file  Tobacco Use  . Smoking status: Never Smoker  .  Smokeless tobacco: Never Used  Substance and Sexual Activity  . Alcohol use: Yes    Alcohol/week: 0.0 standard drinks    Comment: rare  . Drug use: No  . Sexual activity: Not on file  Lifestyle  . Physical activity:    Days per week: Not on file    Minutes per session: Not on file  . Stress: Not on file  Relationships  . Social connections:    Talks on phone: Not on file    Gets together: Not on file    Attends religious service: Not on file    Active member of club or organization: Not on file    Attends meetings of clubs or organizations: Not on file    Relationship status: Not on file  . Intimate partner violence:    Fear of current or ex partner: Not on file    Emotionally abused: Not on file    Physically abused: Not on file      Forced sexual activity: Not on file  Other Topics Concern  . Not on file  Social History Narrative   No living will   Requests mother to make health care decision   Would accept resuscitation attempts   Not sure about tube feeds         Family History  Problem Relation Age of Onset  . Arthritis Mother   . Coronary artery disease Father   . Kidney cancer Father   . Lung cancer Father   . Coronary artery disease Sister   . Ovarian cancer Maternal Grandmother   . Thyroid disease Neg Hx     OBJECTIVE:  Vitals:   09/18/18 1553 09/18/18 1554  BP: 127/76   Pulse: 69   Resp: 20   Temp: 98.1 F (36.7 C)   TempSrc: Oral   SpO2: 100%   Weight:  (!) 306 lb (138.8 kg)     General appearance: alert; no distress Lungs: CTAB CV: RRR without murmur, gallop or rub Skin: 3 x 3 cm induration with surrounding erythema of her right labia; tender to touch; no active drainage Psychological: alert and cooperative; normal mood and affect  Procedure: Verbal consent obtained. Area over induration cleaned with betadine. Lidocaine 2% without epinephrine used to obtain local anesthesia. The most fluctuant portion of the abscess was incised with a #11 blade scalpel. Abscess cavity explored and evacuated. Loculations broken up with a curved hemostat as best as possible given patient discomfort. Cavity packed with packing material and dressed with a clean gauze dressing. Minimal bleeding. No complications.  ASSESSMENT & PLAN:  1. Abscess of right genital labia     Meds ordered this encounter  Medications  . traMADol (ULTRAM) 50 MG tablet    Sig: Take 1 tablet (50 mg total) by mouth every 12 (twelve) hours as needed for severe pain.    Dispense:  10 tablet    Refill:  0    Order Specific Question:   Supervising Provider    Answer:   Wynona Luna [phone]  . sulfamethoxazole-trimethoprim (BACTRIM DS,SEPTRA DS) 800-160 MG tablet    Sig: Take 1 tablet by mouth 2 (two) times daily for  10 days.    Dispense:  20 tablet    Refill:  0    Order Specific Question:   Supervising Provider    Answer:   Wynona Luna [[id number]]   Return in 48 hours to have packing removed and wound reevaluated Keep covered to avoid friction Take OTC ibuprofen  and/or tylenol as needed for pain Tramadol for severe break-through pain Take antibiotic as prescribed and to completion Follow up here or with PCP if symptoms persists Return or go to the ED if you have any new or worsening symptoms such as increased redness, swelling, pain, drainage, fever, chills, nausea, vomiting, etc...  Reviewed expectations re: course of current medical issues. Questions answered. Outlined signs and symptoms indicating need for more acute intervention. Patient verbalized understanding. After Visit Summary given.          Lestine Box, PA-C 09/18/18 1956

## 2018-09-18 NOTE — ED Triage Notes (Signed)
Pt states she has a boil is near the vaginal area. Pt states she noticed this yesterday.

## 2018-09-18 NOTE — Discharge Instructions (Signed)
Return in 48 hours to have packing removed and wound reevaluated Keep covered to avoid friction Take OTC ibuprofen and/or tylenol as needed for pain Tramadol for severe break-through pain Take antibiotic as prescribed and to completion Follow up here or with PCP if symptoms persists Return or go to the ED if you have any new or worsening symptoms such as increased redness, swelling, pain, drainage, fever, chills, nausea, vomiting, etc..Marland Kitchen

## 2018-09-19 ENCOUNTER — Inpatient Hospital Stay: Payer: Medicare Other | Attending: Hematology and Oncology

## 2018-09-19 DIAGNOSIS — E538 Deficiency of other specified B group vitamins: Secondary | ICD-10-CM | POA: Insufficient documentation

## 2018-09-19 DIAGNOSIS — D508 Other iron deficiency anemias: Secondary | ICD-10-CM | POA: Diagnosis not present

## 2018-09-19 LAB — VITAMIN B12: VITAMIN B 12: 63 pg/mL — AB (ref 180–914)

## 2018-09-19 LAB — IRON AND TIBC
Iron: 64 ug/dL (ref 41–142)
SATURATION RATIOS: 23 % (ref 21–57)
TIBC: 280 ug/dL (ref 236–444)
UIBC: 216 ug/dL

## 2018-09-19 LAB — CBC WITH DIFFERENTIAL (CANCER CENTER ONLY)
BASOS ABS: 0 10*3/uL (ref 0.0–0.1)
BASOS PCT: 1 %
EOS ABS: 0.2 10*3/uL (ref 0.0–0.5)
EOS PCT: 3 %
HCT: 40.2 % (ref 34.8–46.6)
HEMOGLOBIN: 13.6 g/dL (ref 11.6–15.9)
Lymphocytes Relative: 31 %
Lymphs Abs: 1.8 10*3/uL (ref 0.9–3.3)
MCH: 31.9 pg (ref 25.1–34.0)
MCHC: 33.9 g/dL (ref 31.5–36.0)
MCV: 94.2 fL (ref 79.5–101.0)
Monocytes Absolute: 0.5 10*3/uL (ref 0.1–0.9)
Monocytes Relative: 9 %
Neutro Abs: 3.2 10*3/uL (ref 1.5–6.5)
Neutrophils Relative %: 56 %
Platelet Count: 185 10*3/uL (ref 145–400)
RBC: 4.27 MIL/uL (ref 3.70–5.45)
RDW: 14.6 % — ABNORMAL HIGH (ref 11.2–14.5)
WBC: 5.7 10*3/uL (ref 3.9–10.3)

## 2018-09-19 LAB — FERRITIN: FERRITIN: 74 ng/mL (ref 11–307)

## 2018-09-19 LAB — FOLATE: FOLATE: 5.7 ng/mL — AB (ref >5.9)

## 2018-09-21 ENCOUNTER — Ambulatory Visit (HOSPITAL_COMMUNITY)
Admission: EM | Admit: 2018-09-21 | Discharge: 2018-09-21 | Disposition: A | Discharge status: Home / Self Care | Payer: Medicare Other | Attending: Family Medicine | Admitting: Family Medicine

## 2018-09-21 ENCOUNTER — Inpatient Hospital Stay: Payer: Medicare Other | Attending: Hematology and Oncology | Admitting: Hematology and Oncology

## 2018-09-21 ENCOUNTER — Telehealth: Payer: Self-pay | Admitting: Hematology and Oncology

## 2018-09-21 ENCOUNTER — Encounter (HOSPITAL_COMMUNITY): Payer: Self-pay

## 2018-09-21 VITALS — HR 66 | Temp 97.8°F | Resp 18 | Ht 64.5 in | Wt 313.7 lb

## 2018-09-21 DIAGNOSIS — K912 Postsurgical malabsorption, not elsewhere classified: Secondary | ICD-10-CM | POA: Diagnosis not present

## 2018-09-21 DIAGNOSIS — E538 Deficiency of other specified B group vitamins: Secondary | ICD-10-CM | POA: Insufficient documentation

## 2018-09-21 DIAGNOSIS — D508 Other iron deficiency anemias: Secondary | ICD-10-CM | POA: Insufficient documentation

## 2018-09-21 DIAGNOSIS — D529 Folate deficiency anemia, unspecified: Secondary | ICD-10-CM | POA: Diagnosis not present

## 2018-09-21 DIAGNOSIS — Z86718 Personal history of other venous thrombosis and embolism: Secondary | ICD-10-CM | POA: Diagnosis not present

## 2018-09-21 DIAGNOSIS — N764 Abscess of vulva: Secondary | ICD-10-CM

## 2018-09-21 DIAGNOSIS — Z5189 Encounter for other specified aftercare: Secondary | ICD-10-CM

## 2018-09-21 DIAGNOSIS — Z9884 Bariatric surgery status: Secondary | ICD-10-CM | POA: Diagnosis not present

## 2018-09-21 MED ORDER — FOLIC ACID 1 MG PO TABS: 1.0000 mg | ORAL_TABLET | Freq: Every day | ORAL | 3 refills | Status: DC

## 2018-09-21 MED ORDER — CYANOCOBALAMIN 1000 MCG/ML IJ SOLN
1000.0000 ug | Freq: Once | INTRAMUSCULAR | Status: AC
  Administered 2018-09-21: 1000 ug via INTRAMUSCULAR

## 2018-09-21 MED ORDER — CYANOCOBALAMIN 1000 MCG/ML IJ SOLN
INTRAMUSCULAR | Status: AC
  Filled 2018-09-21: qty 1

## 2018-09-21 NOTE — ED Triage Notes (Signed)
Pt presents to have wound checked

## 2018-09-21 NOTE — Progress Notes (Signed)
Patient Care Team: [name], MD as PCP - General Angelena Form Annita Brod, MD as PCP - Cardiology (Cardiology)  DIAGNOSIS:  Encounter Diagnoses  Name Primary?  . Vitamin B 12 deficiency Yes  . Other iron deficiency anemia    CHIEF COMPLIANT: Follow-up of B12 and iron deficiency anemia  INTERVAL HISTORY: [name] is a 57 year old lady with a history of gastric bypass surgery who was profoundly iron deficient and required IV iron therapy 3 months ago.  She was also profoundly B12 deficient but her insurance did not authorize the B12 injections that I prescribed for her.  She is here today to recheck her blood work and to discuss her treatment plan.  Overall she continues to feel quite fatigued.  REVIEW OF SYSTEMS:   Constitutional: Denies fevers, chills or abnormal weight loss Eyes: Denies blurriness of vision Ears, nose, mouth, throat, and face: Denies mucositis or sore throat Respiratory: Denies cough, dyspnea or wheezes Cardiovascular: Denies palpitation, chest discomfort Gastrointestinal:  Denies nausea, heartburn or change in bowel habits Skin: Denies abnormal skin rashes Lymphatics: Denies new lymphadenopathy or easy bruising Neurological:Denies numbness, tingling or new weaknesses Behavioral/Psych: Mood is stable, no new changes  Extremities: No lower extremity edema  All other systems were reviewed with the patient and are negative.  I have reviewed the past medical history, past surgical history, social history and family history with the patient and they are unchanged from previous note.  ALLERGIES:  is allergic to [employer] tartrate]; latex; ramelteon; and tetracyclines & related.  MEDICATIONS:  Current Outpatient Medications  Medication Sig Dispense Refill  . diphenhydramine-acetaminophen (TYLENOL PM) 25-500 MG TABS tablet Take 2 tablets by mouth at bedtime.    Marland Kitchen EPINEPHrine 0.3 mg/0.3 mL IJ SOAJ injection Inject 0.3 mLs (0.3 mg total) into  the muscle once. 2 Device 1  . levothyroxine (SYNTHROID, LEVOTHROID) 112 MCG tablet Take 112 mcg by mouth at bedtime.    Marland Kitchen levothyroxine (SYNTHROID, LEVOTHROID) 112 MCG tablet TAKE 1 TABLET (112 MCG TOTAL) BY MOUTH DAILY BEFORE BREAKFAST. NEEDS COMPLETE PHYSICAL EXAM 90 tablet 0  . methylcellulose (CITRUCEL) oral powder Take 1 packet by mouth as needed.     . Multiple Vitamins-Iron (DAILY MULTIVITAMINS/IRON PO) Take 1 tablet by mouth daily.     Marland Kitchen omeprazole (PRILOSEC) 20 MG capsule Take 1 capsule (20 mg total) by mouth daily. 90 capsule 3  . prochlorperazine (COMPAZINE) 5 MG tablet Take 1 tablet (5 mg total) by mouth every 6 (six) hours as needed for nausea or vomiting. 30 tablet 0  . sulfamethoxazole-trimethoprim (BACTRIM DS,SEPTRA DS) 800-160 MG tablet Take 1 tablet by mouth 2 (two) times daily for 10 days. 20 tablet 0  . traMADol (ULTRAM) 50 MG tablet Take 1 tablet (50 mg total) by mouth every 12 (twelve) hours as needed for severe pain. 10 tablet 0   No current facility-administered medications for this visit.     PHYSICAL EXAMINATION: ECOG PERFORMANCE STATUS: 1 - Symptomatic but completely ambulatory  Vitals:   09/21/18 1525  Pulse: 66  Resp: 18  Temp: 97.8 F (36.6 C)  SpO2: 99%   Filed Weights   09/21/18 1525  Weight: (!) 313 lb 11.2 oz (142.3 kg)    GENERAL:alert, no distress and comfortable SKIN: skin color, texture, turgor are normal, no rashes or significant lesions EYES: normal, Conjunctiva are pink and non-injected, sclera clear OROPHARYNX:no exudate, no erythema and lips, buccal mucosa, and tongue normal  NECK: supple, thyroid normal size, non-tender, without nodularity  LYMPH:  no palpable lymphadenopathy in the cervical, axillary or inguinal LUNGS: clear to auscultation and percussion with normal breathing effort HEART: regular rate & rhythm and no murmurs and no lower extremity edema ABDOMEN:abdomen soft, non-tender and normal bowel sounds MUSCULOSKELETAL:no  cyanosis of digits and no clubbing  NEURO: alert & oriented x 3 with fluent speech, no focal motor/sensory deficits EXTREMITIES: No lower extremity edema   LABORATORY DATA:  I have reviewed the data as listed CMP Latest Ref Rng & Units 04/23/2017 09/23/2016 03/15/2015  Glucose 70 - 99 mg/dL 100(H) 99 98  BUN 6 - 23 mg/dL 6 <5(L) 5.5(L)  Creatinine 0.40 - 1.20 mg/dL 0.56 0.55 0.6  Sodium 135 - 145 mEq/L 148(H) 141 143  Potassium 3.5 - 5.1 mEq/L 4.2 3.6 3.8  Chloride 96 - 112 mEq/L 113(H) 107 -  CO2 19 - 32 mEq/L 29 25 28   Calcium 8.4 - 10.5 mg/dL 9.0 8.5(L) 8.5  Total Protein 6.0 - 8.3 g/dL 7.1 - 6.3(L)  Total Bilirubin 0.2 - 1.2 mg/dL 1.0 - 1.36(H)  Alkaline Phos 39 - 117 U/L 140(H) - 146  AST 0 - 37 U/L 14 - 16  ALT 0 - 35 U/L 11 - 15    Lab Results  Component Value Date   WBC 5.7 09/19/2018   HGB 13.6 09/19/2018   HCT 40.2 09/19/2018   MCV 94.2 09/19/2018   PLT 185 09/19/2018   NEUTROABS 3.2 09/19/2018    ASSESSMENT & PLAN:  Iron deficiency anemia 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 No further problems with blood clots at this time.  Vitamin B 12 deficiency Due to gastric bypass surgery. Patient gives herself B-12 supplementation once a month 1000 micrograms  B12 levels May 2019: 98 B12 level October 2019: 68 Her insurance did not cover her B12 replacement therapy. She will need to start on weekly B12 injections x4, then every 2 weeks for 4 and then monthly thereafter  Folic acid 5.4: Slightly deficient. she will take over-the-counter folic acid replacement.  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  The patient has a good  understanding of the overall plan. she agrees with it. she will call with any problems that may develop before the next visit here.   Harriette Ohara, MD 09/21/18

## 2018-09-21 NOTE — Assessment & Plan Note (Signed)
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 No further problems with blood clots at this time.  Vitamin B 12 deficiency Due to gastric bypass surgery. 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

## 2018-09-21 NOTE — ED Provider Notes (Signed)
[facility]   [id number] 09/21/18 Arrival Time: 1404  CC: Post incision and drainage  SUBJECTIVE:  [name] is a 57 y.o. female who presents post incision and drainage of abscess of her right labia on 09/18/18. Doing well. Taking antibiotic as prescribed. Packing removed. Denies redness, swelling, fever, chills, nausea or vomiting.   ROS: As per HPI.  Past Medical History:  Diagnosis Date  . Allergy   . Anemia   . Anxiety   . Chronic venous insufficiency   . Depression   . GERD (gastroesophageal reflux disease)   . Greenfield filter in place   . History of DVT (deep vein thrombosis)   . Hypothyroidism   . Thyroid cancer [facility])    Past Surgical History:  Procedure Laterality Date  . ABDOMINAL HYSTERECTOMY    . BIOPSY THYROID  01/03   suggestive of Hashimoto's  . CESAREAN SECTION  1998  . CHOLECYSTECTOMY  1982  . CRANIECTOMY SUBOCCIPITAL W/ CERVICAL LAMINECTOMY / CHIARI  05/2010   repair, Dr.Pool  . DILATION AND CURETTAGE OF UTERUS  2003  . FETAL SURGERY FOR CONGENITAL HERNIA    . GASTRIC BYPASS    . THYROIDECTOMY  2008   left  . TONSILLECTOMY  1965  . TUBAL LIGATION  1998  . VEIN LIGATION  1995/1998   right leg   Allergies  Allergen Reactions  . Ambien [Zolpidem Tartrate]     [employer]  . Latex Other (See Comments)    REACTION: "anaphylactic shock"  . Ramelteon Other (See Comments)    REACTION: blacks out  . Tetracyclines & Related Anaphylaxis and Other (See Comments)    syncope   No current facility-administered medications on file prior to encounter.    Current Outpatient Medications on File Prior to Encounter  Medication Sig Dispense Refill  . diphenhydramine-acetaminophen (TYLENOL PM) 25-500 MG TABS tablet Take 2 tablets by mouth at bedtime.    Marland Kitchen EPINEPHrine 0.3 mg/0.3 mL IJ SOAJ injection Inject 0.3 mLs (0.3 mg total) into the muscle once. 2 Device 1  . levothyroxine (SYNTHROID, LEVOTHROID) 112 MCG tablet Take 112 mcg by mouth at  bedtime.    Marland Kitchen levothyroxine (SYNTHROID, LEVOTHROID) 112 MCG tablet TAKE 1 TABLET (112 MCG TOTAL) BY MOUTH DAILY BEFORE BREAKFAST. NEEDS COMPLETE PHYSICAL EXAM 90 tablet 0  . methylcellulose (CITRUCEL) oral powder Take 1 packet by mouth as needed.     . Multiple Vitamins-Iron (DAILY MULTIVITAMINS/IRON PO) Take 1 tablet by mouth daily.     Marland Kitchen omeprazole (PRILOSEC) 20 MG capsule Take 1 capsule (20 mg total) by mouth daily. 90 capsule 3  . prochlorperazine (COMPAZINE) 5 MG tablet Take 1 tablet (5 mg total) by mouth every 6 (six) hours as needed for nausea or vomiting. 30 tablet 0  . sulfamethoxazole-trimethoprim (BACTRIM DS,SEPTRA DS) 800-160 MG tablet Take 1 tablet by mouth 2 (two) times daily for 10 days. 20 tablet 0  . traMADol (ULTRAM) 50 MG tablet Take 1 tablet (50 mg total) by mouth every 12 (twelve) hours as needed for severe pain. 10 tablet 0   Social History   Socioeconomic History  . Marital status: Divorced    Spouse name: Not on file  . Number of children: 3  . Years of education: Not on file  . Highest education level: Not on file  Occupational History  . Occupation: Part time Environmental manager  Social Needs  . Financial resource strain: Not on file  . Food insecurity:    Worry: Not on  file    Inability: Not on file  . Transportation needs:    Medical: Not on file    Non-medical: Not on file  Tobacco Use  . Smoking status: Never Smoker  . Smokeless tobacco: Never Used  Substance and Sexual Activity  . Alcohol use: Yes    Alcohol/week: 0.0 standard drinks    Comment: rare  . Drug use: No  . Sexual activity: Not on file  Lifestyle  . Physical activity:    Days per week: Not on file    Minutes per session: Not on file  . Stress: Not on file  Relationships  . Social connections:    Talks on phone: Not on file    Gets together: Not on file    Attends religious service: Not on file    Active member of club or organization: Not on file    Attends meetings of clubs or  organizations: Not on file    Relationship status: Not on file  . Intimate partner violence:    Fear of current or ex partner: Not on file    Emotionally abused: Not on file    Physically abused: Not on file    Forced sexual activity: Not on file  Other Topics Concern  . Not on file  Social History Narrative   No living will   Requests mother to make health care decision   Would accept resuscitation attempts   Not sure about tube feeds         Family History  Problem Relation Age of Onset  . Arthritis Mother   . Coronary artery disease Father   . Kidney cancer Father   . Lung cancer Father   . Coronary artery disease Sister   . Ovarian cancer Maternal Grandmother   . Thyroid disease Neg Hx     OBJECTIVE: Vitals:   09/21/18 1428  BP: 125/82  Pulse: 76  Resp: 18  SpO2: 98%    General appearance: alert; no distress; nontoxic appearance Extremities: no edema Skin: warm and dry; well-healing incision localized to right labia without erythema or drainage; nontender to palpation Psychological: alert and cooperative; normal mood and affect  ASSESSMENT & PLAN:  1. Wound check, abscess     No orders of the defined types were placed in this encounter.  Apply warm compresses 3-4x daily for 10-15 minutes Wash site daily with warm water and mild soap Keep covered to avoid friction Continue antibiotic as prescribed and to completion Follow up here or with PCP if symptoms persists Return or go to the ED if you have any new or worsening symptoms  Reviewed expectations re: course of current medical issues. Questions answered. Outlined signs and symptoms indicating need for more acute intervention. Patient verbalized understanding. After Visit Summary given.   Lestine Box, PA-C 09/21/18 1514

## 2018-09-21 NOTE — Telephone Encounter (Signed)
Gave avs and calendar ° °

## 2018-09-21 NOTE — Discharge Instructions (Signed)
Apply warm compresses 3-4x daily for 10-15 minutes Wash site daily with warm water and mild soap Keep covered to avoid friction Continue antibiotic as prescribed and to completion Follow up here or with PCP if symptoms persists Return or go to the ED if you have any new or worsening symptoms

## 2018-09-22 ENCOUNTER — Ambulatory Visit (INDEPENDENT_AMBULATORY_CARE_PROVIDER_SITE_OTHER): Payer: Medicare Other | Admitting: Internal Medicine

## 2018-09-22 ENCOUNTER — Ambulatory Visit (INDEPENDENT_AMBULATORY_CARE_PROVIDER_SITE_OTHER)
Admission: RE | Admit: 2018-09-22 | Discharge: 2018-09-22 | Disposition: A | Discharge status: Home / Self Care | Payer: Medicare Other | Source: Ambulatory Visit | Attending: Internal Medicine | Admitting: Internal Medicine

## 2018-09-22 ENCOUNTER — Encounter: Payer: Self-pay | Admitting: Internal Medicine

## 2018-09-22 DIAGNOSIS — Z01818 Encounter for other preprocedural examination: Secondary | ICD-10-CM | POA: Diagnosis not present

## 2018-09-22 MED ORDER — CYCLOBENZAPRINE HCL 10 MG PO TABS: ORAL_TABLET | ORAL | 3 refills | Status: DC

## 2018-09-22 MED ORDER — EPINEPHRINE 0.3 MG/0.3ML IJ SOAJ: 0.3000 mg | Freq: Once | INTRAMUSCULAR | 1 refills | Status: AC

## 2018-09-22 NOTE — Assessment & Plan Note (Signed)
Preoperative evaluation does not show anything concerning from medical standpoint I will check CXR--she needs this Otherwise, medically clear to proceed with revision bariatric surgery

## 2018-09-22 NOTE — Progress Notes (Signed)
Subjective:    Patient ID: [name], female    DOB: 1961/05/01, 57 y.o.   MRN: [id number]  HPI Here for medical clearance for revision of bariatric surgery Had bypass ~2004--- have plans to revise this to allow for positive effect again  No chest pain No SOB Trying to walk more---around school, etc. Exercise tolerance is stable No dizziness or syncope  Chronic edema--more on left than right Sleeps on 1 pillow--- no PND No palpitations  Current Outpatient Medications on File Prior to Visit  Medication Sig Dispense Refill  . diphenhydramine-acetaminophen (TYLENOL PM) 25-500 MG TABS tablet Take 2 tablets by mouth at bedtime.    Marland Kitchen EPINEPHrine 0.3 mg/0.3 mL IJ SOAJ injection Inject 0.3 mLs (0.3 mg total) into the muscle once. 2 Device 1  . folic acid (FOLVITE) 1 MG tablet Take 1 tablet (1 mg total) by mouth daily. 90 tablet 3  . levothyroxine (SYNTHROID, LEVOTHROID) 112 MCG tablet Take 112 mcg by mouth at bedtime.    . methylcellulose (CITRUCEL) oral powder Take 1 packet by mouth as needed.     . Multiple Vitamins-Iron (DAILY MULTIVITAMINS/IRON PO) Take 1 tablet by mouth daily.     Marland Kitchen omeprazole (PRILOSEC) 20 MG capsule Take 1 capsule (20 mg total) by mouth daily. 90 capsule 3  . prochlorperazine (COMPAZINE) 5 MG tablet Take 1 tablet (5 mg total) by mouth every 6 (six) hours as needed for nausea or vomiting. 30 tablet 0  . sulfamethoxazole-trimethoprim (BACTRIM DS,SEPTRA DS) 800-160 MG tablet Take 1 tablet by mouth 2 (two) times daily for 10 days. 20 tablet 0  . traMADol (ULTRAM) 50 MG tablet Take 1 tablet (50 mg total) by mouth every 12 (twelve) hours as needed for severe pain. 10 tablet 0   No current facility-administered medications on file prior to visit.     Allergies  Allergen Reactions  . Ambien [Zolpidem Tartrate]     [employer]  . Latex Other (See Comments)    REACTION: "anaphylactic shock"  . Ramelteon Other (See Comments)    REACTION: blacks out  .  Tetracyclines & Related Anaphylaxis and Other (See Comments)    syncope    Past Medical History:  Diagnosis Date  . Allergy   . Anemia   . Anxiety   . Chronic venous insufficiency   . Depression   . GERD (gastroesophageal reflux disease)   . Greenfield filter in place   . History of DVT (deep vein thrombosis)   . Hypothyroidism   . Thyroid cancer [facility])     Past Surgical History:  Procedure Laterality Date  . ABDOMINAL HYSTERECTOMY    . BIOPSY THYROID  01/03   suggestive of Hashimoto's  . CESAREAN SECTION  1998  . CHOLECYSTECTOMY  1982  . CRANIECTOMY SUBOCCIPITAL W/ CERVICAL LAMINECTOMY / CHIARI  05/2010   repair, Dr.Pool  . DILATION AND CURETTAGE OF UTERUS  2003  . FETAL SURGERY FOR CONGENITAL HERNIA    . GASTRIC BYPASS    . THYROIDECTOMY  2008   left  . TONSILLECTOMY  1965  . TUBAL LIGATION  1998  . VEIN LIGATION  1995/1998   right leg    Family History  Problem Relation Age of Onset  . Arthritis Mother   . Coronary artery disease Father   . Kidney cancer Father   . Lung cancer Father   . Coronary artery disease Sister   . Ovarian cancer Maternal Grandmother   . Thyroid disease Neg Hx  Social History   Socioeconomic History  . Marital status: Divorced    Spouse name: Not on file  . Number of children: 3  . Years of education: Not on file  . Highest education level: Not on file  Occupational History  . Occupation: Part time Environmental manager  Social Needs  . Financial resource strain: Not on file  . Food insecurity:    Worry: Not on file    Inability: Not on file  . Transportation needs:    Medical: Not on file    Non-medical: Not on file  Tobacco Use  . Smoking status: Never Smoker  . Smokeless tobacco: Never Used  Substance and Sexual Activity  . Alcohol use: Yes    Alcohol/week: 0.0 standard drinks    Comment: rare  . Drug use: No  . Sexual activity: Not on file  Lifestyle  . Physical activity:    Days per week: Not on file     Minutes per session: Not on file  . Stress: Not on file  Relationships  . Social connections:    Talks on phone: Not on file    Gets together: Not on file    Attends religious service: Not on file    Active member of club or organization: Not on file    Attends meetings of clubs or organizations: Not on file    Relationship status: Not on file  . Intimate partner violence:    Fear of current or ex partner: Not on file    Emotionally abused: Not on file    Physically abused: Not on file    Forced sexual activity: Not on file  Other Topics Concern  . Not on file  Social History Narrative   No living will   Requests mother to make health care decision   Would accept resuscitation attempts   Not sure about tube feeds         Review of Systems Recent labial cyst infection--better with I&D and antibiotics Sleep is still not great---stress with mom Appetite is okay No cigarettes, alcohol or drugs    Objective:   Physical Exam  Constitutional: No distress.  Neck: No thyromegaly present.  Cardiovascular: Normal rate, regular rhythm and normal heart sounds. Exam reveals no gallop.  No murmur heard. Respiratory: Effort normal and breath sounds normal. No respiratory distress. She has no wheezes. She has no rales.  GI: Soft. There is no tenderness.  Musculoskeletal:  3+ non pitting edema No tenderness  Lymphadenopathy:    She has no cervical adenopathy.  Psychiatric: She has a normal mood and affect. Her behavior is normal.           Assessment & Plan:

## 2018-09-28 ENCOUNTER — Inpatient Hospital Stay: Payer: Medicare Other

## 2018-09-28 DIAGNOSIS — E538 Deficiency of other specified B group vitamins: Secondary | ICD-10-CM

## 2018-09-28 DIAGNOSIS — K912 Postsurgical malabsorption, not elsewhere classified: Secondary | ICD-10-CM | POA: Diagnosis not present

## 2018-09-28 DIAGNOSIS — Z9884 Bariatric surgery status: Secondary | ICD-10-CM | POA: Diagnosis not present

## 2018-09-28 DIAGNOSIS — Z86718 Personal history of other venous thrombosis and embolism: Secondary | ICD-10-CM | POA: Diagnosis not present

## 2018-09-28 DIAGNOSIS — D508 Other iron deficiency anemias: Secondary | ICD-10-CM | POA: Diagnosis not present

## 2018-09-28 MED ORDER — CYANOCOBALAMIN 1000 MCG/ML IJ SOLN
1000.0000 ug | Freq: Once | INTRAMUSCULAR | Status: AC
  Administered 2018-09-28: 1000 ug via INTRAMUSCULAR

## 2018-09-28 NOTE — Patient Instructions (Signed)
Cyanocobalamin, Vitamin B12 injection What is this medicine? CYANOCOBALAMIN (sye an oh koe BAL a min) is a man made form of vitamin B12. Vitamin B12 is used in the growth of healthy blood cells, nerve cells, and proteins in the body. It also helps with the metabolism of fats and carbohydrates. This medicine is used to treat people who can not absorb vitamin B12. This medicine may be used for other purposes; ask your health care provider or pharmacist if you have questions. COMMON BRAND NAME(S): B-12 Compliance Kit, B-12 Injection Kit, Cyomin, LA-12, Nutri-Twelve, Physicians EZ Use B-12, Primabalt What should I tell my health care provider before I take this medicine? They need to know if you have any of these conditions: -kidney disease -Leber's disease -megaloblastic anemia -an unusual or allergic reaction to cyanocobalamin, cobalt, other medicines, foods, dyes, or preservatives -pregnant or trying to get pregnant -breast-feeding How should I use this medicine? This medicine is injected into a muscle or deeply under the skin. It is usually given by a health care professional in a clinic or doctor's office. However, your doctor may teach you how to inject yourself. Follow all instructions. Talk to your pediatrician regarding the use of this medicine in children. Special care may be needed. Overdosage: If you think you have taken too much of this medicine contact a poison control center or emergency room at once. NOTE: This medicine is only for you. Do not share this medicine with others. What if I miss a dose? If you are given your dose at a clinic or doctor's office, call to reschedule your appointment. If you give your own injections and you miss a dose, take it as soon as you can. If it is almost time for your next dose, take only that dose. Do not take double or extra doses. What may interact with this medicine? -colchicine -heavy alcohol intake This list may not describe all possible  interactions. Give your health care provider a list of all the medicines, herbs, non-prescription drugs, or dietary supplements you use. Also tell them if you smoke, drink alcohol, or use illegal drugs. Some items may interact with your medicine. What should I watch for while using this medicine? Visit your doctor or health care professional regularly. You may need blood work done while you are taking this medicine. You may need to follow a special diet. Talk to your doctor. Limit your alcohol intake and avoid smoking to get the best benefit. What side effects may I notice from receiving this medicine? Side effects that you should report to your doctor or health care professional as soon as possible: -allergic reactions like skin rash, itching or hives, swelling of the face, lips, or tongue -blue tint to skin -chest tightness, pain -difficulty breathing, wheezing -dizziness -red, swollen painful area on the leg Side effects that usually do not require medical attention (report to your doctor or health care professional if they continue or are bothersome): -diarrhea -headache This list may not describe all possible side effects. Call your doctor for medical advice about side effects. You may report side effects to FDA at 1-800-FDA-1088. Where should I keep my medicine? Keep out of the reach of children. Store at room temperature between 15 and 30 degrees C (59 and 85 degrees F). Protect from light. Throw away any unused medicine after the expiration date. NOTE: This sheet is a summary. It may not cover all possible information. If you have questions about this medicine, talk to your doctor, pharmacist, or   health care provider.  2018 Elsevier/Gold Standard (2008-03-19 22:10:20)  

## 2018-10-02 DIAGNOSIS — Z23 Encounter for immunization: Secondary | ICD-10-CM | POA: Diagnosis not present

## 2018-10-05 ENCOUNTER — Inpatient Hospital Stay: Payer: Medicare Other

## 2018-10-05 VITALS — BP 139/84 | HR 66 | Temp 97.5°F | Resp 18

## 2018-10-05 DIAGNOSIS — D508 Other iron deficiency anemias: Secondary | ICD-10-CM | POA: Diagnosis not present

## 2018-10-05 DIAGNOSIS — Z9884 Bariatric surgery status: Secondary | ICD-10-CM | POA: Diagnosis not present

## 2018-10-05 DIAGNOSIS — Z86718 Personal history of other venous thrombosis and embolism: Secondary | ICD-10-CM | POA: Diagnosis not present

## 2018-10-05 DIAGNOSIS — E538 Deficiency of other specified B group vitamins: Secondary | ICD-10-CM | POA: Diagnosis not present

## 2018-10-05 DIAGNOSIS — K912 Postsurgical malabsorption, not elsewhere classified: Secondary | ICD-10-CM | POA: Diagnosis not present

## 2018-10-05 MED ORDER — CYANOCOBALAMIN 1000 MCG/ML IJ SOLN
1000.0000 ug | Freq: Once | INTRAMUSCULAR | Status: AC
  Administered 2018-10-05: 1000 ug via INTRAMUSCULAR

## 2018-10-05 MED ORDER — CYANOCOBALAMIN 1000 MCG/ML IJ SOLN
INTRAMUSCULAR | Status: AC
  Filled 2018-10-05: qty 1

## 2018-10-12 ENCOUNTER — Inpatient Hospital Stay: Payer: Medicare Other

## 2018-10-12 VITALS — BP 130/83 | HR 70 | Temp 98.4°F | Resp 18

## 2018-10-12 DIAGNOSIS — Z86718 Personal history of other venous thrombosis and embolism: Secondary | ICD-10-CM | POA: Diagnosis not present

## 2018-10-12 DIAGNOSIS — E538 Deficiency of other specified B group vitamins: Secondary | ICD-10-CM

## 2018-10-12 DIAGNOSIS — D508 Other iron deficiency anemias: Secondary | ICD-10-CM | POA: Diagnosis not present

## 2018-10-12 DIAGNOSIS — Z9884 Bariatric surgery status: Secondary | ICD-10-CM | POA: Diagnosis not present

## 2018-10-12 DIAGNOSIS — K912 Postsurgical malabsorption, not elsewhere classified: Secondary | ICD-10-CM | POA: Diagnosis not present

## 2018-10-12 MED ORDER — CYANOCOBALAMIN 1000 MCG/ML IJ SOLN
1000.0000 ug | Freq: Once | INTRAMUSCULAR | Status: AC
  Administered 2018-10-12: 1000 ug via INTRAMUSCULAR

## 2018-10-12 NOTE — Patient Instructions (Signed)
Cyanocobalamin, Vitamin B12 injection What is this medicine? CYANOCOBALAMIN (sye an oh koe BAL a min) is a man made form of vitamin B12. Vitamin B12 is used in the growth of healthy blood cells, nerve cells, and proteins in the body. It also helps with the metabolism of fats and carbohydrates. This medicine is used to treat people who can not absorb vitamin B12. This medicine may be used for other purposes; ask your health care provider or pharmacist if you have questions. COMMON BRAND NAME(S): B-12 Compliance Kit, B-12 Injection Kit, Cyomin, LA-12, Nutri-Twelve, Physicians EZ Use B-12, Primabalt What should I tell my health care provider before I take this medicine? They need to know if you have any of these conditions: -kidney disease -Leber's disease -megaloblastic anemia -an unusual or allergic reaction to cyanocobalamin, cobalt, other medicines, foods, dyes, or preservatives -pregnant or trying to get pregnant -breast-feeding How should I use this medicine? This medicine is injected into a muscle or deeply under the skin. It is usually given by a health care professional in a clinic or doctor's office. However, your doctor may teach you how to inject yourself. Follow all instructions. Talk to your pediatrician regarding the use of this medicine in children. Special care may be needed. Overdosage: If you think you have taken too much of this medicine contact a poison control center or emergency room at once. NOTE: This medicine is only for you. Do not share this medicine with others. What if I miss a dose? If you are given your dose at a clinic or doctor's office, call to reschedule your appointment. If you give your own injections and you miss a dose, take it as soon as you can. If it is almost time for your next dose, take only that dose. Do not take double or extra doses. What may interact with this medicine? -colchicine -heavy alcohol intake This list may not describe all possible  interactions. Give your health care provider a list of all the medicines, herbs, non-prescription drugs, or dietary supplements you use. Also tell them if you smoke, drink alcohol, or use illegal drugs. Some items may interact with your medicine. What should I watch for while using this medicine? Visit your doctor or health care professional regularly. You may need blood work done while you are taking this medicine. You may need to follow a special diet. Talk to your doctor. Limit your alcohol intake and avoid smoking to get the best benefit. What side effects may I notice from receiving this medicine? Side effects that you should report to your doctor or health care professional as soon as possible: -allergic reactions like skin rash, itching or hives, swelling of the face, lips, or tongue -blue tint to skin -chest tightness, pain -difficulty breathing, wheezing -dizziness -red, swollen painful area on the leg Side effects that usually do not require medical attention (report to your doctor or health care professional if they continue or are bothersome): -diarrhea -headache This list may not describe all possible side effects. Call your doctor for medical advice about side effects. You may report side effects to FDA at 1-800-FDA-1088. Where should I keep my medicine? Keep out of the reach of children. Store at room temperature between 15 and 30 degrees C (59 and 85 degrees F). Protect from light. Throw away any unused medicine after the expiration date. NOTE: This sheet is a summary. It may not cover all possible information. If you have questions about this medicine, talk to your doctor, pharmacist, or   health care provider.  2018 Elsevier/Gold Standard (2008-03-19 22:10:20)  

## 2018-10-19 ENCOUNTER — Inpatient Hospital Stay: Payer: Medicare Other

## 2018-10-19 MED ORDER — CYANOCOBALAMIN 1000 MCG/ML IJ SOLN
INTRAMUSCULAR | Status: AC
  Filled 2018-10-19: qty 1

## 2018-11-02 ENCOUNTER — Inpatient Hospital Stay: Payer: Medicare Other | Attending: Hematology and Oncology

## 2018-11-02 VITALS — BP 138/84 | HR 64 | Temp 97.8°F | Resp 18

## 2018-11-02 DIAGNOSIS — E538 Deficiency of other specified B group vitamins: Secondary | ICD-10-CM | POA: Insufficient documentation

## 2018-11-02 MED ORDER — CYANOCOBALAMIN 1000 MCG/ML IJ SOLN
INTRAMUSCULAR | Status: AC
  Filled 2018-11-02: qty 1

## 2018-11-02 MED ORDER — CYANOCOBALAMIN 1000 MCG/ML IJ SOLN
1000.0000 ug | Freq: Once | INTRAMUSCULAR | Status: AC
  Administered 2018-11-02: 1000 ug via INTRAMUSCULAR

## 2018-11-09 ENCOUNTER — Ambulatory Visit (INDEPENDENT_AMBULATORY_CARE_PROVIDER_SITE_OTHER): Payer: Medicare Other | Admitting: Gastroenterology

## 2018-11-09 ENCOUNTER — Encounter: Payer: Self-pay | Admitting: Gastroenterology

## 2018-11-09 VITALS — BP 112/80 | HR 64 | Ht 63.75 in | Wt 313.8 lb

## 2018-11-09 DIAGNOSIS — R1310 Dysphagia, unspecified: Secondary | ICD-10-CM | POA: Diagnosis not present

## 2018-11-09 DIAGNOSIS — K625 Hemorrhage of anus and rectum: Secondary | ICD-10-CM | POA: Diagnosis not present

## 2018-11-09 DIAGNOSIS — K219 Gastro-esophageal reflux disease without esophagitis: Secondary | ICD-10-CM | POA: Diagnosis not present

## 2018-11-09 NOTE — Progress Notes (Signed)
11/09/2018 [name] [id number] 1961/09/26   HISTORY OF PRESENT ILLNESS:  This is a 57 year old female who is a patient of Dr. Ardis Hughs.  She was seen here by him in 03/2017 for complaints of rectal bleeding and dysphagia and was scheduled for EGD and colonoscopy but patient says that her mother got sick and she has been dealing with her since that time.  She presents here today with the same complaints.  Reports rectal bleeding that has been ongoing.  Says that she sees blood with about every BM, sometimes large amounts.  Says that she will use several wipes and sometimes they are covered in blood.  Every once in a while she will have rectal pain that feels like shards of glass when she has a harder BM.  CBC and iron studies were normal in September.  Also reports intermittent issues with swallowing, which is why the EGD was scheduled last time as well.     Past Medical History:  Diagnosis Date  . Allergy   . Anemia   . Anxiety   . Chronic venous insufficiency   . Depression   . GERD (gastroesophageal reflux disease)   . Greenfield filter in place   . History of DVT (deep vein thrombosis)   . Hypothyroidism   . Thyroid cancer [facility])    Past Surgical History:  Procedure Laterality Date  . ABDOMINAL HYSTERECTOMY    . BIOPSY THYROID  01/03   suggestive of Hashimoto's  . CESAREAN SECTION  1998  . CHOLECYSTECTOMY  1982  . CRANIECTOMY SUBOCCIPITAL W/ CERVICAL LAMINECTOMY / CHIARI  05/2010   repair, Dr.Pool  . DILATION AND CURETTAGE OF UTERUS  2003  . FETAL SURGERY FOR CONGENITAL HERNIA    . GASTRIC BYPASS    . THYROIDECTOMY  2008   left  . TONSILLECTOMY  1965  . TUBAL LIGATION  1998  . VEIN LIGATION  1995/1998   right leg    reports that she has never smoked. She has never used smokeless tobacco. She reports that she drinks alcohol. She reports that she does not use drugs. family history includes Arthritis in her mother; Coronary artery disease in her father and  sister; Kidney cancer in her father; Lung cancer in her father; Ovarian cancer in her maternal grandmother. Allergies  Allergen Reactions  . Ambien [Zolpidem Tartrate]     [employer]  . Latex Other (See Comments)    REACTION: "anaphylactic shock"  . Ramelteon Other (See Comments)    REACTION: blacks out  . Tetracyclines & Related Anaphylaxis and Other (See Comments)    syncope      Outpatient Encounter Medications as of 11/09/2018  Medication Sig  . cyclobenzaprine (FLEXERIL) 10 MG tablet TAKE 1 TABLET (10 MG TOTAL) BY MOUTH 2 (TWO) TIMES DAILY AS NEEDED FOR MUSCLE SPASMS.  Marland Kitchen diphenhydramine-acetaminophen (TYLENOL PM) 25-500 MG TABS tablet Take 2 tablets by mouth at bedtime.  . folic acid (FOLVITE) 1 MG tablet Take 1 tablet (1 mg total) by mouth daily.  Marland Kitchen levothyroxine (SYNTHROID, LEVOTHROID) 112 MCG tablet Take 112 mcg by mouth at bedtime.  . methylcellulose (CITRUCEL) oral powder Take 1 packet by mouth as needed.   . Multiple Vitamins-Iron (DAILY MULTIVITAMINS/IRON PO) Take 1 tablet by mouth daily.   Marland Kitchen omeprazole (PRILOSEC) 20 MG capsule Take 1 capsule (20 mg total) by mouth daily.  . prochlorperazine (COMPAZINE) 5 MG tablet Take 1 tablet (5 mg total) by mouth every 6 (six) hours as  needed for nausea or vomiting.  . [DISCONTINUED] traMADol (ULTRAM) 50 MG tablet Take 1 tablet (50 mg total) by mouth every 12 (twelve) hours as needed for severe pain.   No facility-administered encounter medications on file as of 11/09/2018.      REVIEW OF SYSTEMS  : All other systems reviewed and negative except where noted in the History of Present Illness.   PHYSICAL EXAM: BP 112/80   Pulse 64   Ht 5' 3.75" (1.619 m)   Wt (!) 313 lb 12.8 oz (142.3 kg)   BMI 54.29 kg/m  General: Well developed white female in no acute distress Head: Normocephalic and atraumatic Eyes:  Sclerae anicteric, conjunctiva pink. Ears: Normal auditory acuity Lungs: Clear throughout to auscultation; no increased  WOB. Heart: Regular rate and rhythm; no M/R/G. Abdomen: Soft, non-distended.  BS present.  Non-tender. Rectal:  External hemorrhoids noted but not inflamed.  DRE revealed soft light brown stool in the rectum that was heme negative.   Musculoskeletal: Symmetrical with no gross deformities  Skin: No lesions on visible extremities Extremities: No edema  Neurological: Alert oriented x 4, grossly non-focal Psychological:  Alert and cooperative. Normal mood and affect  ASSESSMENT AND PLAN: *Rectal bleeding:  Suspect from internal hemorrhoids, but last colonoscopy was 2006 so need to rule out other source as well.  Will plan for colonoscopy with Dr. Ardis Hughs at [facility] hospital due to BMI.  Will try hydrocortisone cream internally on preparation H suppositories at bedtime in the interim. *Dysphagia:  Intermittent.  Had been scheduled for EGD in the past by Dr. Ardis Hughs as well but to cancel due to her mother getting sick.  Will plan for EGD with possible dilation as well.  **The risks, benefits, and alternatives to EGD with possible dilation and colonoscopy were discussed with the patient and she consents to proceed.   CC:  [name], MD

## 2018-11-09 NOTE — H&P (View-Only) (Signed)
11/09/2018 JAVANA SCHEY [id number] 12-13-1961   HISTORY OF PRESENT ILLNESS:  This is a 57 year old female who is a patient of Dr. Ardis Hughs.  She was seen here by him in 03/2017 for complaints of rectal bleeding and dysphagia and was scheduled for EGD and colonoscopy but patient says that her mother got sick and she has been dealing with her since that time.  She presents here today with the same complaints.  Reports rectal bleeding that has been ongoing.  Says that she sees blood with about every BM, sometimes large amounts.  Says that she will use several wipes and sometimes they are covered in blood.  Every once in a while she will have rectal pain that feels like shards of glass when she has a harder BM.  CBC and iron studies were normal in September.  Also reports intermittent issues with swallowing, which is why the EGD was scheduled last time as well.     Past Medical History:  Diagnosis Date  . Allergy   . Anemia   . Anxiety   . Chronic venous insufficiency   . Depression   . GERD (gastroesophageal reflux disease)   . Greenfield filter in place   . History of DVT (deep vein thrombosis)   . Hypothyroidism   . Thyroid cancer [facility])    Past Surgical History:  Procedure Laterality Date  . ABDOMINAL HYSTERECTOMY    . BIOPSY THYROID  01/03   suggestive of Hashimoto's  . CESAREAN SECTION  1998  . CHOLECYSTECTOMY  1982  . CRANIECTOMY SUBOCCIPITAL W/ CERVICAL LAMINECTOMY / CHIARI  05/2010   repair, Dr.Pool  . DILATION AND CURETTAGE OF UTERUS  2003  . FETAL SURGERY FOR CONGENITAL HERNIA    . GASTRIC BYPASS    . THYROIDECTOMY  2008   left  . TONSILLECTOMY  1965  . TUBAL LIGATION  1998  . VEIN LIGATION  1995/1998   right leg    reports that she has never smoked. She has never used smokeless tobacco. She reports that she drinks alcohol. She reports that she does not use drugs. family history includes Arthritis in her mother; Coronary artery disease in her father and  sister; Kidney cancer in her father; Lung cancer in her father; Ovarian cancer in her maternal grandmother. Allergies  Allergen Reactions  . Ambien [Zolpidem Tartrate]     [employer]  . Latex Other (See Comments)    REACTION: "anaphylactic shock"  . Ramelteon Other (See Comments)    REACTION: blacks out  . Tetracyclines & Related Anaphylaxis and Other (See Comments)    syncope      Outpatient Encounter Medications as of 11/09/2018  Medication Sig  . cyclobenzaprine (FLEXERIL) 10 MG tablet TAKE 1 TABLET (10 MG TOTAL) BY MOUTH 2 (TWO) TIMES DAILY AS NEEDED FOR MUSCLE SPASMS.  Marland Kitchen diphenhydramine-acetaminophen (TYLENOL PM) 25-500 MG TABS tablet Take 2 tablets by mouth at bedtime.  . folic acid (FOLVITE) 1 MG tablet Take 1 tablet (1 mg total) by mouth daily.  Marland Kitchen levothyroxine (SYNTHROID, LEVOTHROID) 112 MCG tablet Take 112 mcg by mouth at bedtime.  . methylcellulose (CITRUCEL) oral powder Take 1 packet by mouth as needed.   . Multiple Vitamins-Iron (DAILY MULTIVITAMINS/IRON PO) Take 1 tablet by mouth daily.   Marland Kitchen omeprazole (PRILOSEC) 20 MG capsule Take 1 capsule (20 mg total) by mouth daily.  . prochlorperazine (COMPAZINE) 5 MG tablet Take 1 tablet (5 mg total) by mouth every 6 (six) hours as  needed for nausea or vomiting.  . [DISCONTINUED] traMADol (ULTRAM) 50 MG tablet Take 1 tablet (50 mg total) by mouth every 12 (twelve) hours as needed for severe pain.   No facility-administered encounter medications on file as of 11/09/2018.      REVIEW OF SYSTEMS  : All other systems reviewed and negative except where noted in the History of Present Illness.   PHYSICAL EXAM: BP 112/80   Pulse 64   Ht 5' 3.75" (1.619 m)   Wt (!) 313 lb 12.8 oz (142.3 kg)   BMI 54.29 kg/m  General: Well developed white female in no acute distress Head: Normocephalic and atraumatic Eyes:  Sclerae anicteric, conjunctiva pink. Ears: Normal auditory acuity Lungs: Clear throughout to auscultation; no increased  WOB. Heart: Regular rate and rhythm; no M/R/G. Abdomen: Soft, non-distended.  BS present.  Non-tender. Rectal:  External hemorrhoids noted but not inflamed.  DRE revealed soft light brown stool in the rectum that was heme negative.   Musculoskeletal: Symmetrical with no gross deformities  Skin: No lesions on visible extremities Extremities: No edema  Neurological: Alert oriented x 4, grossly non-focal Psychological:  Alert and cooperative. Normal mood and affect  ASSESSMENT AND PLAN: *Rectal bleeding:  Suspect from internal hemorrhoids, but last colonoscopy was 2006 so need to rule out other source as well.  Will plan for colonoscopy with Dr. Ardis Hughs at [facility] hospital due to BMI.  Will try hydrocortisone cream internally on preparation H suppositories at bedtime in the interim. *Dysphagia:  Intermittent.  Had been scheduled for EGD in the past by Dr. Ardis Hughs as well but to cancel due to her mother getting sick.  Will plan for EGD with possible dilation as well.  **The risks, benefits, and alternatives to EGD with possible dilation and colonoscopy were discussed with the patient and she consents to proceed.   CC:  [name], MD

## 2018-11-09 NOTE — Patient Instructions (Addendum)
You have been scheduled for a colonoscopy. Please follow written instructions given to you at your visit today.  Please pick up your prep supplies at the pharmacy within the next 1-3 days. If you use inhalers (even only as needed), please bring them with you on the day of your procedure. Your physician has requested that you go to www.startemmi.com and enter the access code given to you at your visit today. This web site gives a general overview about your procedure. However, you should still follow specific instructions given to you by our office regarding your preparation for the procedure.  Get Preperation H suppositories and Preperation cream. Put the cream on the suppository and insert in the rectum nightly for 7 nights.

## 2018-11-10 NOTE — Progress Notes (Signed)
I agree with the above note, plan 

## 2018-11-16 ENCOUNTER — Inpatient Hospital Stay: Payer: Medicare Other

## 2018-11-16 MED ORDER — CYANOCOBALAMIN 1000 MCG/ML IJ SOLN: 1000.0000 ug | Freq: Once | INTRAMUSCULAR | Status: DC

## 2018-11-16 NOTE — Patient Instructions (Signed)
Cyanocobalamin, Vitamin B12 injection What is this medicine? CYANOCOBALAMIN (sye an oh koe BAL a min) is a man made form of vitamin B12. Vitamin B12 is used in the growth of healthy blood cells, nerve cells, and proteins in the body. It also helps with the metabolism of fats and carbohydrates. This medicine is used to treat people who can not absorb vitamin B12. This medicine may be used for other purposes; ask your health care provider or pharmacist if you have questions. COMMON BRAND NAME(S): B-12 Compliance Kit, B-12 Injection Kit, Cyomin, LA-12, Nutri-Twelve, Physicians EZ Use B-12, Primabalt What should I tell my health care provider before I take this medicine? They need to know if you have any of these conditions: -kidney disease -Leber's disease -megaloblastic anemia -an unusual or allergic reaction to cyanocobalamin, cobalt, other medicines, foods, dyes, or preservatives -pregnant or trying to get pregnant -breast-feeding How should I use this medicine? This medicine is injected into a muscle or deeply under the skin. It is usually given by a health care professional in a clinic or doctor's office. However, your doctor may teach you how to inject yourself. Follow all instructions. Talk to your pediatrician regarding the use of this medicine in children. Special care may be needed. Overdosage: If you think you have taken too much of this medicine contact a poison control center or emergency room at once. NOTE: This medicine is only for you. Do not share this medicine with others. What if I miss a dose? If you are given your dose at a clinic or doctor's office, call to reschedule your appointment. If you give your own injections and you miss a dose, take it as soon as you can. If it is almost time for your next dose, take only that dose. Do not take double or extra doses. What may interact with this medicine? -colchicine -heavy alcohol intake This list may not describe all possible  interactions. Give your health care provider a list of all the medicines, herbs, non-prescription drugs, or dietary supplements you use. Also tell them if you smoke, drink alcohol, or use illegal drugs. Some items may interact with your medicine. What should I watch for while using this medicine? Visit your doctor or health care professional regularly. You may need blood work done while you are taking this medicine. You may need to follow a special diet. Talk to your doctor. Limit your alcohol intake and avoid smoking to get the best benefit. What side effects may I notice from receiving this medicine? Side effects that you should report to your doctor or health care professional as soon as possible: -allergic reactions like skin rash, itching or hives, swelling of the face, lips, or tongue -blue tint to skin -chest tightness, pain -difficulty breathing, wheezing -dizziness -red, swollen painful area on the leg Side effects that usually do not require medical attention (report to your doctor or health care professional if they continue or are bothersome): -diarrhea -headache This list may not describe all possible side effects. Call your doctor for medical advice about side effects. You may report side effects to FDA at 1-800-FDA-1088. Where should I keep my medicine? Keep out of the reach of children. Store at room temperature between 15 and 30 degrees C (59 and 85 degrees F). Protect from light. Throw away any unused medicine after the expiration date. NOTE: This sheet is a summary. It may not cover all possible information. If you have questions about this medicine, talk to your doctor, pharmacist, or   health care provider.  2018 Elsevier/Gold Standard (2008-03-19 22:10:20)  

## 2018-11-30 ENCOUNTER — Inpatient Hospital Stay: Payer: Medicare Other | Attending: Hematology and Oncology

## 2018-11-30 VITALS — BP 123/85 | HR 72 | Temp 97.5°F | Resp 18

## 2018-11-30 DIAGNOSIS — E538 Deficiency of other specified B group vitamins: Secondary | ICD-10-CM | POA: Insufficient documentation

## 2018-11-30 MED ORDER — CYANOCOBALAMIN 1000 MCG/ML IJ SOLN
1000.0000 ug | Freq: Once | INTRAMUSCULAR | Status: AC
  Administered 2018-11-30: 1000 ug via INTRAMUSCULAR

## 2018-11-30 MED ORDER — CYANOCOBALAMIN 1000 MCG/ML IJ SOLN
INTRAMUSCULAR | Status: AC
  Filled 2018-11-30: qty 1

## 2018-12-05 ENCOUNTER — Telehealth: Payer: Self-pay | Admitting: Gastroenterology

## 2018-12-05 MED ORDER — PEG 3350-KCL-NA BICARB-NACL 420 G PO SOLR: 4000.0000 mL | Freq: Once | ORAL | 0 refills | Status: AC

## 2018-12-05 NOTE — Telephone Encounter (Signed)
Golytely sent to [facility] at this time. Pt notified.

## 2018-12-05 NOTE — Telephone Encounter (Signed)
Pt called in and stated that when she went to the pharm. To pick up the go lightly for the proc that is sched. She was advised by pharm that it was not there.

## 2018-12-06 ENCOUNTER — Encounter (HOSPITAL_COMMUNITY): Payer: Self-pay | Admitting: *Deleted

## 2018-12-06 ENCOUNTER — Other Ambulatory Visit: Payer: Self-pay

## 2018-12-07 ENCOUNTER — Encounter (HOSPITAL_COMMUNITY): Payer: Self-pay | Admitting: *Deleted

## 2018-12-08 ENCOUNTER — Encounter (HOSPITAL_COMMUNITY)
Admission: RE | Disposition: A | Discharge status: Home / Self Care | Payer: Self-pay | Source: Home / Self Care | Attending: Gastroenterology

## 2018-12-08 ENCOUNTER — Ambulatory Visit (HOSPITAL_COMMUNITY)
Admission: RE | Admit: 2018-12-08 | Discharge: 2018-12-08 | Disposition: A | Discharge status: Home / Self Care | Payer: Medicare Other | Attending: Gastroenterology | Admitting: Gastroenterology

## 2018-12-08 ENCOUNTER — Ambulatory Visit (HOSPITAL_COMMUNITY): Payer: Medicare Other | Admitting: Registered Nurse

## 2018-12-08 ENCOUNTER — Encounter (HOSPITAL_COMMUNITY): Payer: Self-pay | Admitting: Gastroenterology

## 2018-12-08 ENCOUNTER — Other Ambulatory Visit: Payer: Self-pay

## 2018-12-08 DIAGNOSIS — Z881 Allergy status to other antibiotic agents status: Secondary | ICD-10-CM | POA: Insufficient documentation

## 2018-12-08 DIAGNOSIS — Z8261 Family history of arthritis: Secondary | ICD-10-CM | POA: Diagnosis not present

## 2018-12-08 DIAGNOSIS — K649 Unspecified hemorrhoids: Secondary | ICD-10-CM | POA: Diagnosis not present

## 2018-12-08 DIAGNOSIS — R1310 Dysphagia, unspecified: Secondary | ICD-10-CM

## 2018-12-08 DIAGNOSIS — Z91040 Latex allergy status: Secondary | ICD-10-CM | POA: Insufficient documentation

## 2018-12-08 DIAGNOSIS — Z8051 Family history of malignant neoplasm of kidney: Secondary | ICD-10-CM | POA: Diagnosis not present

## 2018-12-08 DIAGNOSIS — F329 Major depressive disorder, single episode, unspecified: Secondary | ICD-10-CM | POA: Insufficient documentation

## 2018-12-08 DIAGNOSIS — K644 Residual hemorrhoidal skin tags: Secondary | ICD-10-CM | POA: Diagnosis not present

## 2018-12-08 DIAGNOSIS — Z90710 Acquired absence of both cervix and uterus: Secondary | ICD-10-CM | POA: Diagnosis not present

## 2018-12-08 DIAGNOSIS — I872 Venous insufficiency (chronic) (peripheral): Secondary | ICD-10-CM | POA: Diagnosis not present

## 2018-12-08 DIAGNOSIS — Z8249 Family history of ischemic heart disease and other diseases of the circulatory system: Secondary | ICD-10-CM | POA: Diagnosis not present

## 2018-12-08 DIAGNOSIS — Z79899 Other long term (current) drug therapy: Secondary | ICD-10-CM | POA: Diagnosis not present

## 2018-12-08 DIAGNOSIS — D120 Benign neoplasm of cecum: Secondary | ICD-10-CM

## 2018-12-08 DIAGNOSIS — K219 Gastro-esophageal reflux disease without esophagitis: Secondary | ICD-10-CM | POA: Diagnosis not present

## 2018-12-08 DIAGNOSIS — Z9884 Bariatric surgery status: Secondary | ICD-10-CM | POA: Insufficient documentation

## 2018-12-08 DIAGNOSIS — K635 Polyp of colon: Secondary | ICD-10-CM

## 2018-12-08 DIAGNOSIS — Z8041 Family history of malignant neoplasm of ovary: Secondary | ICD-10-CM | POA: Insufficient documentation

## 2018-12-08 DIAGNOSIS — Z888 Allergy status to other drugs, medicaments and biological substances status: Secondary | ICD-10-CM | POA: Diagnosis not present

## 2018-12-08 DIAGNOSIS — Z885 Allergy status to narcotic agent status: Secondary | ICD-10-CM | POA: Diagnosis not present

## 2018-12-08 DIAGNOSIS — K625 Hemorrhage of anus and rectum: Secondary | ICD-10-CM | POA: Diagnosis not present

## 2018-12-08 DIAGNOSIS — D122 Benign neoplasm of ascending colon: Secondary | ICD-10-CM | POA: Diagnosis not present

## 2018-12-08 DIAGNOSIS — K648 Other hemorrhoids: Secondary | ICD-10-CM | POA: Diagnosis not present

## 2018-12-08 DIAGNOSIS — K222 Esophageal obstruction: Secondary | ICD-10-CM | POA: Insufficient documentation

## 2018-12-08 DIAGNOSIS — F419 Anxiety disorder, unspecified: Secondary | ICD-10-CM | POA: Diagnosis not present

## 2018-12-08 DIAGNOSIS — Z85850 Personal history of malignant neoplasm of thyroid: Secondary | ICD-10-CM | POA: Diagnosis not present

## 2018-12-08 DIAGNOSIS — Z86718 Personal history of other venous thrombosis and embolism: Secondary | ICD-10-CM | POA: Diagnosis not present

## 2018-12-08 DIAGNOSIS — E039 Hypothyroidism, unspecified: Secondary | ICD-10-CM | POA: Insufficient documentation

## 2018-12-08 DIAGNOSIS — Z801 Family history of malignant neoplasm of trachea, bronchus and lung: Secondary | ICD-10-CM | POA: Insufficient documentation

## 2018-12-08 DIAGNOSIS — Z9049 Acquired absence of other specified parts of digestive tract: Secondary | ICD-10-CM | POA: Diagnosis not present

## 2018-12-08 HISTORY — PX: ESOPHAGOGASTRODUODENOSCOPY (EGD) WITH PROPOFOL: SHX5813

## 2018-12-08 HISTORY — PX: BALLOON DILATION: SHX5330

## 2018-12-08 HISTORY — PX: COLONOSCOPY WITH PROPOFOL: SHX5780

## 2018-12-08 HISTORY — PX: POLYPECTOMY: SHX5525

## 2018-12-08 HISTORY — DX: Family history of other specified conditions: Z84.89

## 2018-12-08 SURGERY — COLONOSCOPY WITH PROPOFOL
Anesthesia: Monitor Anesthesia Care

## 2018-12-08 MED ORDER — GLYCOPYRROLATE 0.2 MG/ML IJ SOLN
INTRAMUSCULAR | Status: DC | PRN
  Administered 2018-12-08: 0.2 mg via INTRAVENOUS

## 2018-12-08 MED ORDER — PROPOFOL 10 MG/ML IV BOLUS
INTRAVENOUS | Status: AC
  Filled 2018-12-08: qty 20

## 2018-12-08 MED ORDER — SODIUM CHLORIDE 0.9 % IV SOLN: INTRAVENOUS | Status: DC

## 2018-12-08 MED ORDER — LIDOCAINE HCL (CARDIAC) PF 100 MG/5ML IV SOSY
PREFILLED_SYRINGE | INTRAVENOUS | Status: DC | PRN
  Administered 2018-12-08: 75 mg via INTRAVENOUS

## 2018-12-08 MED ORDER — LACTATED RINGERS IV SOLN
INTRAVENOUS | Status: DC
  Administered 2018-12-08: 08:00:00 via INTRAVENOUS

## 2018-12-08 MED ORDER — PROPOFOL 10 MG/ML IV BOLUS
INTRAVENOUS | Status: AC
  Filled 2018-12-08: qty 40

## 2018-12-08 MED ORDER — PROPOFOL 500 MG/50ML IV EMUL
INTRAVENOUS | Status: DC | PRN
  Administered 2018-12-08: 200 ug/kg/min via INTRAVENOUS

## 2018-12-08 SURGICAL SUPPLY — 25 items

## 2018-12-08 NOTE — Interval H&P Note (Signed)
History and Physical Interval Note:  12/08/2018 7:30 AM  [name]  has presented today for surgery, with the diagnosis of Rectal bleeding, Jerrye Bushy, Dysphagia  The various methods of treatment have been discussed with the patient and family. After consideration of risks, benefits and other options for treatment, the patient has consented to  Procedure(s): COLONOSCOPY WITH PROPOFOL (N/A) ESOPHAGOGASTRODUODENOSCOPY (EGD) WITH PROPOFOL (N/A) as a surgical intervention .  The patient's history has been reviewed, patient examined, no change in status, stable for surgery.  I have reviewed the patient's chart and labs.  Questions were answered to the patient's satisfaction.     Milus Banister

## 2018-12-08 NOTE — Anesthesia Postprocedure Evaluation (Signed)
Anesthesia Post Note  Patient: RELLA EGELSTON  Procedure(s) Performed: COLONOSCOPY WITH PROPOFOL (N/A ) ESOPHAGOGASTRODUODENOSCOPY (EGD) WITH PROPOFOL (N/A ) POLYPECTOMY BALLOON DILATION (N/A )     Patient location during evaluation: PACU Anesthesia Type: MAC Level of consciousness: awake and alert Pain management: pain level controlled Vital Signs Assessment: post-procedure vital signs reviewed and stable Respiratory status: spontaneous breathing, nonlabored ventilation, respiratory function stable and patient connected to nasal cannula oxygen Cardiovascular status: stable and blood pressure returned to baseline Postop Assessment: no apparent nausea or vomiting Anesthetic complications: no    Last Vitals:  Vitals:   12/08/18 0750 12/08/18 0925  BP: (!) 112/55 109/66  Pulse: 67 74  Resp: 16 14  Temp: 36.6 C 36.8 C  SpO2: 100% 96%    Last Pain:  Vitals:   12/08/18 0925  TempSrc: Oral  PainSc:                  Effie Berkshire

## 2018-12-08 NOTE — Anesthesia Preprocedure Evaluation (Addendum)
Anesthesia Evaluation  Patient identified by MRN, date of birth, ID band Patient awake    Reviewed: Allergy & Precautions, NPO status , Patient's Chart, lab work & pertinent test results  Airway Mallampati: I  TM Distance: >3 FB Neck ROM: Full    Dental  (+) Edentulous Upper, Edentulous Lower   Pulmonary    breath sounds clear to auscultation       Cardiovascular negative cardio ROS   Rhythm:Regular Rate:Normal     Neuro/Psych Anxiety Depression    GI/Hepatic Neg liver ROS, GERD  Medicated,  Endo/Other  Hypothyroidism   Renal/GU negative Renal ROS     Musculoskeletal   Abdominal (+) + obese,   Peds  Hematology   Anesthesia Other Findings   Reproductive/Obstetrics                            Echo: - Left ventricle: The cavity size was normal. There was mild   concentric hypertrophy. Systolic function was normal. The   estimated ejection fraction was in the range of 55% to 60%. Wall   motion was normal; there were no regional wall motion   abnormalities. Doppler parameters are consistent with abnormal   left ventricular relaxation (grade 1 diastolic dysfunction).   Anesthesia Physical Anesthesia Plan  ASA: III  Anesthesia Plan: MAC   Post-op Pain Management:    Induction: Intravenous  PONV Risk Score and Plan: 2 and Propofol infusion and Treatment may vary due to age or medical condition  Airway Management Planned: Natural Airway and Nasal Cannula  Additional Equipment: None  Intra-op Plan:   Post-operative Plan:   Informed Consent: I have reviewed the patients History and Physical, chart, labs and discussed the procedure including the risks, benefits and alternatives for the proposed anesthesia with the patient or authorized representative who has indicated his/her understanding and acceptance.     Plan Discussed with: CRNA  Anesthesia Plan Comments:         Anesthesia Quick Evaluation

## 2018-12-08 NOTE — Discharge Instructions (Signed)
Colonoscopy, Adult, Care After  This sheet gives you information about how to care for yourself after your procedure. Your health care provider may also give you more specific instructions. If you have problems or questions, contact your health care provider.  What can I expect after the procedure?  After the procedure, it is common to have:  · A small amount of blood in your stool for 24 hours after the procedure.  · Some gas.  · Mild abdominal cramping or bloating.  Follow these instructions at home:  General instructions  · For the first 24 hours after the procedure:  ? Do not drive or use machinery.  ? Do not sign important documents.  ? Do not drink alcohol.  ? Do your regular daily activities at a slower pace than normal.  ? Eat soft, easy-to-digest foods.  · Take over-the-counter or prescription medicines only as told by your health care provider.  Relieving cramping and bloating    · Try walking around when you have cramps or feel bloated.  · Apply heat to your abdomen as told by your health care provider. Use a heat source that your health care provider recommends, such as a moist heat pack or a heating pad.  ? Place a towel between your skin and the heat source.  ? Leave the heat on for 20-30 minutes.  ? Remove the heat if your skin turns bright red. This is especially important if you are unable to feel pain, heat, or cold. You may have a greater risk of getting burned.  Eating and drinking    · Drink enough fluid to keep your urine pale yellow.  · Resume your normal diet as instructed by your health care provider. Avoid heavy or fried foods that are hard to digest.  · Avoid drinking alcohol for as long as instructed by your health care provider.  Contact a health care provider if:  · You have blood in your stool 2-3 days after the procedure.  Get help right away if:  · You have more than a small spotting of blood in your stool.  · You pass large blood clots in your stool.  · Your abdomen is  swollen.  · You have nausea or vomiting.  · You have a fever.  · You have increasing abdominal pain that is not relieved with medicine.  Summary  · After the procedure, it is common to have a small amount of blood in your stool. You may also have mild abdominal cramping and bloating.  · For the first 24 hours after the procedure, do not drive or use machinery, sign important documents, or drink alcohol.  · Contact your health care provider if you have a lot of blood in your stool, nausea or vomiting, a fever, or increased abdominal pain.  This information is not intended to replace advice given to you by your health care provider. Make sure you discuss any questions you have with your health care provider.  Document Released: 07/21/2004 Document Revised: 09/29/2017 Document Reviewed: 02/18/2016  Elsevier Interactive Patient Education © 2019 [employer]

## 2018-12-08 NOTE — Transfer of Care (Signed)
Immediate Anesthesia Transfer of Care Note  Patient: [name]  Procedure(s) Performed: COLONOSCOPY WITH PROPOFOL (N/A ) ESOPHAGOGASTRODUODENOSCOPY (EGD) WITH PROPOFOL (N/A ) POLYPECTOMY BALLOON DILATION (N/A )  Patient Location: PACU  Anesthesia Type:MAC  Level of Consciousness: awake, alert , oriented and patient cooperative  Airway & Oxygen Therapy: Patient Spontanous Breathing and Patient connected to nasal cannula oxygen  Post-op Assessment: Report given to RN, Post -op Vital signs reviewed and stable and Patient moving all extremities X 4  Post vital signs: stable  Last Vitals:  Vitals Value Taken Time  BP 109/66 12/08/2018  9:25 AM  Temp 36.8 C 12/08/2018  9:25 AM  Pulse 66 12/08/2018  9:27 AM  Resp 24 12/08/2018  9:27 AM  SpO2 100 % 12/08/2018  9:27 AM  Vitals shown include unvalidated device data.  Last Pain:  Vitals:   12/08/18 0925  TempSrc: Oral  PainSc:          Complications: No apparent anesthesia complications

## 2018-12-08 NOTE — Op Note (Signed)
[facility] Patient Name: [name] Procedure Date: 12/08/2018 MRN: [id number] Attending MD: Milus Banister , MD Date of Birth: [birth year] CSN: [id number] Age: 57 Admit Type: Outpatient Procedure:                Colonoscopy Indications:              Rectal bleeding Providers:                Milus Banister, MD, Cleda Daub, RN, Cherylynn Ridges, Technician, Enrigue Catena, CRNA Referring MD:              Medicines:                Monitored Anesthesia Care Complications:            No immediate complications. Estimated blood loss:                            None. Estimated Blood Loss:     Estimated blood loss was minimal. Procedure:                Pre-Anesthesia Assessment:                           - Prior to the procedure, a History and Physical                            was performed, and patient medications and                            allergies were reviewed. The patient's tolerance of                            previous anesthesia was also reviewed. The risks                            and benefits of the procedure and the sedation                            options and risks were discussed with the patient.                            All questions were answered, and informed consent                            was obtained. Prior Anticoagulants: The patient has                            taken no previous anticoagulant or antiplatelet                            agents. ASA Grade Assessment: IV - A patient with  severe systemic disease that is a constant threat                            to life. After reviewing the risks and benefits,                            the patient was deemed in satisfactory condition to                            undergo the procedure.                           After obtaining informed consent, the colonoscope                            was passed under direct vision.  Throughout the                            procedure, the patient's blood pressure, pulse, and                            oxygen saturations were monitored continuously. The                            CF-HQ190L ([id number]) Olympus Adult Colonoscope was                            introduced through the anus and advanced to the the                            cecum, identified by appendiceal orifice and                            ileocecal valve. The colonoscopy was performed                            without difficulty. The patient tolerated the                            procedure well. The quality of the bowel                            preparation was good. The ileocecal valve,                            appendiceal orifice, and rectum were photographed. Scope In: 8:42:20 AM Scope Out: 9:00:15 AM Scope Withdrawal Time: 0 hours 11 minutes 36 seconds  Total Procedure Duration: 0 hours 17 minutes 55 seconds  Findings:      Two sessile polyps were found in the ascending colon and cecum. The       polyps were 3 to 5 mm in size. These polyps were removed with a cold       snare. Resection and retrieval were complete.      External and internal hemorrhoids were found.  The hemorrhoids were       medium-sized.      The exam was otherwise without abnormality on direct and retroflexion       views. Impression:               - Two 3 to 5 mm polyps in the ascending colon and                            in the cecum, removed with a cold snare. Resected                            and retrieved.                           - External and internal hemorrhoids.                           - The examination was otherwise normal on direct                            and retroflexion views. Moderate Sedation:      Not Applicable - Patient had care per Anesthesia. Recommendation:           - Patient has a contact number available for                            emergencies. The signs and symptoms of potential                             delayed complications were discussed with the                            patient. Return to normal activities tomorrow.                            Written discharge instructions were provided to the                            patient.                           - Resume previous diet.                           - Continue present medications.                           - Please start once daily OTC fiber supplement                            (orange flavored powder citrucel) to help with your                            constipation. This will likely also help your  hemorrhoids. If you are still bothered by daily                            rectal bleeding after 2-3 months of daily fiber                            supplements please call Dr. Ardis Hughs' office to                            arrange in office hemorrhoidal banding referral.                           If the polyp(s) is proven to be 'pre-cancerous' on                            pathology, you will need repeat colonoscopy in 5                            years. If the polyp(s) is NOT 'precancerous' on                            pathology then you should repeat colon cancer                            screening in 10 years with colonoscopy without need                            for colon cancer screening by any method prior to                            then (including stool testing). Procedure Code(s):        --- Professional ---                           [phone], Colonoscopy, flexible; with removal of                            tumor(s), polyp(s), or other lesion(s) by snare                            technique Diagnosis Code(s):        --- Professional ---                           D12.2, Benign neoplasm of ascending colon                           D12.0, Benign neoplasm of cecum                           K64.8, Other hemorrhoids                           K62.5, Hemorrhage of anus and  rectum CPT copyright 2018 American Medical Association.  All rights reserved. The codes documented in this report are preliminary and upon coder review may  be revised to meet current compliance requirements. Milus Banister, MD 12/08/2018 9:04:44 AM This report has been signed electronically. Number of Addenda: 0

## 2018-12-08 NOTE — Anesthesia Procedure Notes (Addendum)
Procedure Name: MAC Date/Time: 12/08/2018 8:35 AM Performed by: Lissa Morales, CRNA Pre-anesthesia Checklist: Patient identified, Emergency Drugs available, Suction available, Patient being monitored and Timeout performed Patient Re-evaluated:Patient Re-evaluated prior to induction Oxygen Delivery Method: Nasal cannula Placement Confirmation: positive ETCO2

## 2018-12-08 NOTE — Op Note (Signed)
[facility] Patient Name: [name] Procedure Date: 12/08/2018 MRN: [id number] Attending MD: Milus Banister , MD Date of Birth: 09/19/1961 CSN: [id number] Age: 57 Admit Type: Outpatient Procedure:                Upper GI endoscopy Indications:              Dysphagia; s/p remote bariatric gastric surgery,                            planning on revision of the site Providers:                Milus Banister, MD, Cleda Daub, RN, Cherylynn Ridges, Technician, Enrigue Catena, CRNA Referring MD:              Medicines:                Monitored Anesthesia Care Complications:            No immediate complications. Estimated blood loss:                            None. Estimated Blood Loss:     Estimated blood loss: none. Procedure:                Pre-Anesthesia Assessment:                           - Prior to the procedure, a History and Physical                            was performed, and patient medications and                            allergies were reviewed. The patient's tolerance of                            previous anesthesia was also reviewed. The risks                            and benefits of the procedure and the sedation                            options and risks were discussed with the patient.                            All questions were answered, and informed consent                            was obtained. Prior Anticoagulants: The patient has                            taken no previous anticoagulant or antiplatelet  agents. ASA Grade Assessment: IV - A patient with                            severe systemic disease that is a constant threat                            to life. After reviewing the risks and benefits,                            the patient was deemed in satisfactory condition to                            undergo the procedure.                           After obtaining informed  consent, the endoscope was                            passed under direct vision. Throughout the                            procedure, the patient's blood pressure, pulse, and                            oxygen saturations were monitored continuously. The                            [id number]-[id number] ([id number]) Olympus adult endoscope was                            introduced through the mouth, and advanced to the                            jejunum. The upper GI endoscopy was accomplished                            without difficulty. The patient tolerated the                            procedure well. Scope In: Scope Out: Findings:      One benign-appearing, intrinsic mild stenosis (thin Schatzki's ring) was       found at the gastroesophageal junction. A TTS dilator was passed through       the scope. Dilation with an 18-19-20 mm balloon dilator was performed to       20 mm (held for one minute). There was no post dilation bleeding.      Post bariatric anatomy noted with small remnant gastric pouch, no       gastrojejunal anastomotic stricture, could only advance into one limb of       the jejunum.      The exam was otherwise without abnormality. Impression:               - Benign GE junction focal stricture (Schatzki's  ring), dilated with TTS balloon to 76mm.                           - Post bariatric surgery anatomy (typically small                            gastric pouch, normal GJ anastomosis).                           - No specimens collected. Moderate Sedation:      Not Applicable - Patient had care per Anesthesia. Recommendation:           - Patient has a contact number available for                            emergencies. The signs and symptoms of potential                            delayed complications were discussed with the                            patient. Return to normal activities tomorrow.                            Written discharge instructions  were provided to the                            patient.                           - Resume previous diet.                           - Continue present medications. Procedure Code(s):        --- Professional ---                           [id number], Esophagogastroduodenoscopy, flexible,                            transoral; with transendoscopic balloon dilation of                            esophagus (less than 30 mm diameter) Diagnosis Code(s):        --- Professional ---                           K22.2, Esophageal obstruction                           R13.10, Dysphagia, unspecified CPT copyright 2018 American Medical Association. All rights reserved. The codes documented in this report are preliminary and upon coder review may  be revised to meet current compliance requirements. Milus Banister, MD 12/08/2018 9:26:27 AM This report has been signed electronically. Number of Addenda: 0

## 2018-12-09 ENCOUNTER — Encounter (HOSPITAL_COMMUNITY): Payer: Self-pay | Admitting: Gastroenterology

## 2018-12-12 ENCOUNTER — Other Ambulatory Visit: Payer: Self-pay | Admitting: Internal Medicine

## 2018-12-15 ENCOUNTER — Inpatient Hospital Stay: Payer: Medicare Other

## 2018-12-15 DIAGNOSIS — E538 Deficiency of other specified B group vitamins: Secondary | ICD-10-CM

## 2018-12-15 MED ORDER — CYANOCOBALAMIN 1000 MCG/ML IJ SOLN
1000.0000 ug | Freq: Once | INTRAMUSCULAR | Status: AC
  Administered 2018-12-15: 1000 ug via INTRAMUSCULAR

## 2018-12-15 NOTE — Patient Instructions (Signed)
Cyanocobalamin, Vitamin B12 injection What is this medicine? CYANOCOBALAMIN (sye an oh koe BAL a min) is a man made form of vitamin B12. Vitamin B12 is used in the growth of healthy blood cells, nerve cells, and proteins in the body. It also helps with the metabolism of fats and carbohydrates. This medicine is used to treat people who can not absorb vitamin B12. This medicine may be used for other purposes; ask your health care provider or pharmacist if you have questions. COMMON BRAND NAME(S): B-12 Compliance Kit, B-12 Injection Kit, Cyomin, LA-12, Nutri-Twelve, Physicians EZ Use B-12, Primabalt What should I tell my health care provider before I take this medicine? They need to know if you have any of these conditions: -kidney disease -Leber's disease -megaloblastic anemia -an unusual or allergic reaction to cyanocobalamin, cobalt, other medicines, foods, dyes, or preservatives -pregnant or trying to get pregnant -breast-feeding How should I use this medicine? This medicine is injected into a muscle or deeply under the skin. It is usually given by a health care professional in a clinic or doctor's office. However, your doctor may teach you how to inject yourself. Follow all instructions. Talk to your pediatrician regarding the use of this medicine in children. Special care may be needed. Overdosage: If you think you have taken too much of this medicine contact a poison control center or emergency room at once. NOTE: This medicine is only for you. Do not share this medicine with others. What if I miss a dose? If you are given your dose at a clinic or doctor's office, call to reschedule your appointment. If you give your own injections and you miss a dose, take it as soon as you can. If it is almost time for your next dose, take only that dose. Do not take double or extra doses. What may interact with this medicine? -colchicine -heavy alcohol intake This list may not describe all possible  interactions. Give your health care provider a list of all the medicines, herbs, non-prescription drugs, or dietary supplements you use. Also tell them if you smoke, drink alcohol, or use illegal drugs. Some items may interact with your medicine. What should I watch for while using this medicine? Visit your doctor or health care professional regularly. You may need blood work done while you are taking this medicine. You may need to follow a special diet. Talk to your doctor. Limit your alcohol intake and avoid smoking to get the best benefit. What side effects may I notice from receiving this medicine? Side effects that you should report to your doctor or health care professional as soon as possible: -allergic reactions like skin rash, itching or hives, swelling of the face, lips, or tongue -blue tint to skin -chest tightness, pain -difficulty breathing, wheezing -dizziness -red, swollen painful area on the leg Side effects that usually do not require medical attention (report to your doctor or health care professional if they continue or are bothersome): -diarrhea -headache This list may not describe all possible side effects. Call your doctor for medical advice about side effects. You may report side effects to FDA at 1-800-FDA-1088. Where should I keep my medicine? Keep out of the reach of children. Store at room temperature between 15 and 30 degrees C (59 and 85 degrees F). Protect from light. Throw away any unused medicine after the expiration date. NOTE: This sheet is a summary. It may not cover all possible information. If you have questions about this medicine, talk to your doctor, pharmacist, or   health care provider.  2019 Elsevier/Gold Standard (2008-03-19 22:10:20)  

## 2018-12-16 ENCOUNTER — Encounter: Payer: Medicare Other | Admitting: Gastroenterology

## 2019-01-06 ENCOUNTER — Telehealth: Payer: Self-pay | Admitting: Hematology and Oncology

## 2019-01-06 NOTE — Telephone Encounter (Signed)
Left voicemail for pt re appts being changes due to [facility] bmdc.

## 2019-01-17 NOTE — Progress Notes (Signed)
Patient Care Team: [name], MD as PCP - General Angelena Form Annita Brod, MD as PCP - Cardiology (Cardiology)  DIAGNOSIS:    ICD-10-CM   1. Vitamin B 12 deficiency E53.8   2. Other iron deficiency anemia D50.8     CHIEF COMPLIANT: Iron deficiency and B12 deficiency  INTERVAL HISTORY: [name] is a 58 y.o. with above-mentioned history of gastric bypass surgery who was profoundly iron deficient and B12 deficient. She is currently on weekly B12 injections. On 12/08/18 she had a colonoscopy due to rectal bleeding. She presents to the clinic today. She has been very fatigued recently and B12 shots have not provided much improvement. She is planning to have surgery in June.   REVIEW OF SYSTEMS:   Constitutional: Denies fevers, chills or abnormal weight loss (+) fatigue Eyes: Denies blurriness of vision Ears, nose, mouth, throat, and face: Denies mucositis or sore throat Respiratory: Denies cough, dyspnea or wheezes Cardiovascular: Denies palpitation, chest discomfort Gastrointestinal:  Denies nausea, heartburn or change in bowel habits Skin: Denies abnormal skin rashes Lymphatics: Denies new lymphadenopathy or easy bruising Neurological: Denies numbness, tingling or new weaknesses Behavioral/Psych: Mood is stable, no new changes  Extremities: No lower extremity edema Breast: denies any pain or lumps or nodules in either breasts All other systems were reviewed with the patient and are negative.  I have reviewed the past medical history, past surgical history, social history and family history with the patient and they are unchanged from previous note.  ALLERGIES:  is allergic to [employer] tartrate]; food; latex; ramelteon; and tetracyclines & related.  MEDICATIONS:  Current Outpatient Medications  Medication Sig Dispense Refill  . cyclobenzaprine (FLEXERIL) 10 MG tablet TAKE 1 TABLET (10 MG TOTAL) BY MOUTH 2 (TWO) TIMES DAILY AS NEEDED FOR MUSCLE SPASMS.  (Patient taking differently: Take 20 mg by mouth at bedtime. ) 60 tablet 3  . diphenhydramine-acetaminophen (TYLENOL PM) 25-500 MG TABS tablet Take 2 tablets by mouth at bedtime.    Marland Kitchen EPINEPHrine 0.3 mg/0.3 mL IJ SOAJ injection Inject 0.3 mg into the muscle daily as needed for anaphylaxis.  1  . folic acid (FOLVITE) 1 MG tablet Take 1 tablet (1 mg total) by mouth daily. (Patient taking differently: Take 1 mg by mouth at bedtime. ) 90 tablet 3  . levothyroxine (SYNTHROID, LEVOTHROID) 112 MCG tablet TAKE 1 TABLET (112 MCG TOTAL) BY MOUTH DAILY BEFORE BREAKFAST. NEEDS COMPLETE PHYSICAL EXAM 90 tablet 3  . methylcellulose (CITRUCEL) oral powder Take 1 packet by mouth daily as needed (constipation).     . Multiple Vitamin (MULTIVITAMIN WITH MINERALS) TABS tablet Take 1 tablet by mouth at bedtime.    Marland Kitchen omeprazole (PRILOSEC) 20 MG capsule Take 1 capsule (20 mg total) by mouth daily. (Patient taking differently: Take 20 mg by mouth at bedtime. ) 90 capsule 3  . prochlorperazine (COMPAZINE) 5 MG tablet Take 1 tablet (5 mg total) by mouth every 6 (six) hours as needed for nausea or vomiting. 30 tablet 0   No current facility-administered medications for this visit.     PHYSICAL EXAMINATION: ECOG PERFORMANCE STATUS: 1 - Symptomatic but completely ambulatory  Vitals:   01/18/19 1114  BP: (!) 152/96  Pulse: 65  Resp: 18  Temp: 98.3 F (36.8 C)  SpO2: 98%   Filed Weights   01/18/19 1114  Weight: (!) 319 lb 11.2 oz (145 kg)    GENERAL: alert, no distress and comfortable SKIN: skin color, texture, turgor are normal, no rashes or  significant lesions EYES: normal, Conjunctiva are pink and non-injected, sclera clear OROPHARYNX: no exudate, no erythema and lips, buccal mucosa, and tongue normal  NECK: supple, thyroid normal size, non-tender, without nodularity LYMPH: no palpable lymphadenopathy in the cervical, axillary or inguinal LUNGS: clear to auscultation and percussion with normal breathing  effort HEART: regular rate & rhythm and no murmurs and no lower extremity edema ABDOMEN: abdomen soft, non-tender and normal bowel sounds MUSCULOSKELETAL: no cyanosis of digits and no clubbing  NEURO: alert & oriented x 3 with fluent speech, no focal motor/sensory deficits EXTREMITIES: No lower extremity edema  LABORATORY DATA:  I have reviewed the data as listed CMP Latest Ref Rng & Units 04/23/2017 09/23/2016 03/15/2015  Glucose 70 - 99 mg/dL 100(H) 99 98  BUN 6 - 23 mg/dL 6 <5(L) 5.5(L)  Creatinine 0.40 - 1.20 mg/dL 0.56 0.55 0.6  Sodium 135 - 145 mEq/L 148(H) 141 143  Potassium 3.5 - 5.1 mEq/L 4.2 3.6 3.8  Chloride 96 - 112 mEq/L 113(H) 107 -  CO2 19 - 32 mEq/L 29 25 28   Calcium 8.4 - 10.5 mg/dL 9.0 8.5(L) 8.5  Total Protein 6.0 - 8.3 g/dL 7.1 - 6.3(L)  Total Bilirubin 0.2 - 1.2 mg/dL 1.0 - 1.36(H)  Alkaline Phos 39 - 117 U/L 140(H) - 146  AST 0 - 37 U/L 14 - 16  ALT 0 - 35 U/L 11 - 15    Lab Results  Component Value Date   WBC 6.1 01/18/2019   HGB 13.6 01/18/2019   HCT 40.7 01/18/2019   MCV 95.3 01/18/2019   PLT 199 01/18/2019   NEUTROABS 3.4 01/18/2019    ASSESSMENT & PLAN:  Vitamin B 12 deficiency Due to gastric bypass surgery.   B12 levels May 2019: 98 B12 level October 2019: 68 Her insurance did not cover her B12 replacement therapy. She will need to start on weekly B12 injections x4, then every 2 weeks for 4 and then monthly thereafter started 38/06/5642  Folic acid 5.4: Slightly deficient. she will take over-the-counter folic acid replacement. Continue monthly B12 injections Return to clinic in 6 months with labs and follow-up.  Iron deficiency anemia Also secondary to gastric bypass surgery IV iron treatment was given July 2019 Ferritin is 57 today with a hemoglobin of 13.  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 No further problems with blood clots at this time.    No orders of the defined  types were placed in this encounter.  The patient has a good understanding of the overall plan. she agrees with it. she will call with any problems that may develop before the next visit here.  Nicholas Lose, MD 01/18/2019  Julious Oka Dorshimer am acting as scribe for Dr. Nicholas Lose.  I have reviewed the above documentation for accuracy and completeness, and I agree with the above.

## 2019-01-18 ENCOUNTER — Other Ambulatory Visit: Payer: Self-pay | Admitting: Hematology and Oncology

## 2019-01-18 ENCOUNTER — Inpatient Hospital Stay: Payer: Medicare Other | Attending: Hematology and Oncology

## 2019-01-18 ENCOUNTER — Telehealth: Payer: Self-pay | Admitting: Hematology and Oncology

## 2019-01-18 ENCOUNTER — Inpatient Hospital Stay: Payer: Medicare Other

## 2019-01-18 ENCOUNTER — Inpatient Hospital Stay (HOSPITAL_BASED_OUTPATIENT_CLINIC_OR_DEPARTMENT_OTHER): Payer: Medicare Other | Admitting: Hematology and Oncology

## 2019-01-18 DIAGNOSIS — Z79899 Other long term (current) drug therapy: Secondary | ICD-10-CM

## 2019-01-18 DIAGNOSIS — Z86718 Personal history of other venous thrombosis and embolism: Secondary | ICD-10-CM | POA: Insufficient documentation

## 2019-01-18 DIAGNOSIS — Z9884 Bariatric surgery status: Secondary | ICD-10-CM | POA: Diagnosis not present

## 2019-01-18 DIAGNOSIS — E538 Deficiency of other specified B group vitamins: Secondary | ICD-10-CM

## 2019-01-18 DIAGNOSIS — R5383 Other fatigue: Secondary | ICD-10-CM

## 2019-01-18 DIAGNOSIS — D508 Other iron deficiency anemias: Secondary | ICD-10-CM

## 2019-01-18 LAB — CBC WITH DIFFERENTIAL (CANCER CENTER ONLY)
Abs Immature Granulocytes: 0.03 10*3/uL (ref 0.00–0.07)
Basophils Absolute: 0 10*3/uL (ref 0.0–0.1)
Basophils Relative: 0 %
Eosinophils Absolute: 0.2 10*3/uL (ref 0.0–0.5)
Eosinophils Relative: 3 %
HCT: 40.7 % (ref 36.0–46.0)
Hemoglobin: 13.6 g/dL (ref 12.0–15.0)
Immature Granulocytes: 1 %
Lymphocytes Relative: 32 %
Lymphs Abs: 2 10*3/uL (ref 0.7–4.0)
MCH: 31.9 pg (ref 26.0–34.0)
MCHC: 33.4 g/dL (ref 30.0–36.0)
MCV: 95.3 fL (ref 80.0–100.0)
MONO ABS: 0.5 10*3/uL (ref 0.1–1.0)
Monocytes Relative: 8 %
NEUTROS ABS: 3.4 10*3/uL (ref 1.7–7.7)
Neutrophils Relative %: 56 %
Platelet Count: 199 10*3/uL (ref 150–400)
RBC: 4.27 MIL/uL (ref 3.87–5.11)
RDW: 12.7 % (ref 11.5–15.5)
WBC Count: 6.1 10*3/uL (ref 4.0–10.5)
nRBC: 0 % (ref 0.0–0.2)

## 2019-01-18 LAB — VITAMIN B12: Vitamin B-12: 130 pg/mL — ABNORMAL LOW (ref 180–914)

## 2019-01-18 LAB — IRON AND TIBC
Iron: 83 ug/dL (ref 41–142)
Saturation Ratios: 27 % (ref 21–57)
TIBC: 311 ug/dL (ref 236–444)
UIBC: 228 ug/dL (ref 120–384)

## 2019-01-18 LAB — FERRITIN: Ferritin: 67 ng/mL (ref 11–307)

## 2019-01-18 LAB — FOLATE: Folate: 10.2 ng/mL (ref >5.9)

## 2019-01-18 MED ORDER — CYANOCOBALAMIN 1000 MCG/ML IJ SOLN
1000.0000 ug | Freq: Once | INTRAMUSCULAR | Status: AC
  Administered 2019-01-18: 1000 ug via INTRAMUSCULAR

## 2019-01-18 MED ORDER — CYANOCOBALAMIN 1000 MCG/ML IJ SOLN
INTRAMUSCULAR | Status: AC
  Filled 2019-01-18: qty 1

## 2019-01-18 NOTE — Telephone Encounter (Signed)
Gave AVS and calendar. Per patient, wants to wait to schedule 6 month follow up appoint. Will call to schedule

## 2019-01-18 NOTE — Telephone Encounter (Signed)
I informed the patient that the B12 level has improved but it is still very low and that I recommend weekly B12 shots x4 before going on to monthly injections.  I will see her back in 4 weeks to recheck her B12 levels.

## 2019-01-18 NOTE — Assessment & Plan Note (Signed)
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 No further problems with blood clots at this time.

## 2019-01-18 NOTE — Assessment & Plan Note (Signed)
Due to gastric bypass surgery.   B12 levels May 2019: 98 B12 level October 2019: 68 Her insurance did not cover her B12 replacement therapy. She will need to start on weekly B12 injections x4, then every 2 weeks for 4 and then monthly thereafter started 69/05/2951  Folic acid 5.4: Slightly deficient. she will take over-the-counter folic acid replacement. Continue monthly B12 injections Return to clinic in 6 months with labs and follow-up.

## 2019-01-19 ENCOUNTER — Telehealth: Payer: Self-pay | Admitting: Hematology and Oncology

## 2019-01-19 NOTE — Telephone Encounter (Signed)
Scheduled appt per 1/29 sch message - pt is aware of apt dates and times

## 2019-01-24 ENCOUNTER — Inpatient Hospital Stay: Payer: Medicare Other | Attending: Hematology and Oncology

## 2019-01-24 DIAGNOSIS — D508 Other iron deficiency anemias: Secondary | ICD-10-CM | POA: Insufficient documentation

## 2019-01-24 DIAGNOSIS — Z79899 Other long term (current) drug therapy: Secondary | ICD-10-CM | POA: Insufficient documentation

## 2019-01-24 DIAGNOSIS — Z9884 Bariatric surgery status: Secondary | ICD-10-CM | POA: Insufficient documentation

## 2019-01-24 DIAGNOSIS — E538 Deficiency of other specified B group vitamins: Secondary | ICD-10-CM

## 2019-01-24 MED ORDER — CYANOCOBALAMIN 1000 MCG/ML IJ SOLN
INTRAMUSCULAR | Status: AC
  Filled 2019-01-24: qty 1

## 2019-01-24 MED ORDER — CYANOCOBALAMIN 1000 MCG/ML IJ SOLN
1000.0000 ug | Freq: Once | INTRAMUSCULAR | Status: AC
  Administered 2019-01-24: 1000 ug via INTRAMUSCULAR

## 2019-01-25 ENCOUNTER — Inpatient Hospital Stay: Payer: Medicare Other

## 2019-01-25 MED ORDER — CYANOCOBALAMIN 1000 MCG/ML IJ SOLN
INTRAMUSCULAR | Status: AC
  Filled 2019-01-25: qty 1

## 2019-01-31 ENCOUNTER — Inpatient Hospital Stay: Payer: Medicare Other

## 2019-01-31 DIAGNOSIS — E538 Deficiency of other specified B group vitamins: Secondary | ICD-10-CM | POA: Diagnosis not present

## 2019-01-31 DIAGNOSIS — D508 Other iron deficiency anemias: Secondary | ICD-10-CM | POA: Diagnosis not present

## 2019-01-31 DIAGNOSIS — Z79899 Other long term (current) drug therapy: Secondary | ICD-10-CM | POA: Diagnosis not present

## 2019-01-31 DIAGNOSIS — Z9884 Bariatric surgery status: Secondary | ICD-10-CM | POA: Diagnosis not present

## 2019-01-31 MED ORDER — CYANOCOBALAMIN 1000 MCG/ML IJ SOLN
1000.0000 ug | Freq: Once | INTRAMUSCULAR | Status: AC
  Administered 2019-01-31: 1000 ug via INTRAMUSCULAR

## 2019-01-31 MED ORDER — CYANOCOBALAMIN 1000 MCG/ML IJ SOLN
INTRAMUSCULAR | Status: AC
  Filled 2019-01-31: qty 1

## 2019-02-07 ENCOUNTER — Inpatient Hospital Stay: Payer: Medicare Other

## 2019-02-07 VITALS — BP 150/90 | HR 70 | Temp 98.1°F | Resp 18

## 2019-02-07 DIAGNOSIS — Z9884 Bariatric surgery status: Secondary | ICD-10-CM | POA: Diagnosis not present

## 2019-02-07 DIAGNOSIS — Z79899 Other long term (current) drug therapy: Secondary | ICD-10-CM | POA: Diagnosis not present

## 2019-02-07 DIAGNOSIS — E538 Deficiency of other specified B group vitamins: Secondary | ICD-10-CM

## 2019-02-07 DIAGNOSIS — D508 Other iron deficiency anemias: Secondary | ICD-10-CM | POA: Diagnosis not present

## 2019-02-07 MED ORDER — CYANOCOBALAMIN 1000 MCG/ML IJ SOLN
1000.0000 ug | Freq: Once | INTRAMUSCULAR | Status: AC
  Administered 2019-02-07: 1000 ug via INTRAMUSCULAR

## 2019-02-07 MED ORDER — CYANOCOBALAMIN 1000 MCG/ML IJ SOLN
INTRAMUSCULAR | Status: AC
  Filled 2019-02-07: qty 1

## 2019-02-10 NOTE — Progress Notes (Signed)
Patient Care Team: [name], MD as PCP - General Angelena Form Annita Brod, MD as PCP - Cardiology (Cardiology)  DIAGNOSIS:    ICD-10-CM   1. Other iron deficiency anemia D50.8 Iron and TIBC    Ferritin  2. Vitamin B 12 deficiency E53.8 CBC with Differential (Cancer Center Only)    Vitamin B12    Iron and TIBC    Ferritin    CHIEF COMPLIANT: Iron deficiency and B12 deficiency  INTERVAL HISTORY: [name] is a 58 y.o. with above-mentioned history of gastric bypass surgery followed by iron deficiency and B12 deficiency. She is currently on weekly B12 injections. She presents to the clinic alone today and does not notice a significant improvement since being on B12 injections. 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 Her B12 level today is pending.   REVIEW OF SYSTEMS:   Constitutional: Denies fevers, chills or abnormal weight loss Eyes: Denies blurriness of vision Ears, nose, mouth, throat, and face: Denies mucositis or sore throat Respiratory: Denies cough, dyspnea or wheezes Cardiovascular: Denies palpitation, chest discomfort Gastrointestinal: Denies nausea, heartburn or change in bowel habits Skin: Denies abnormal skin rashes Lymphatics: Denies new lymphadenopathy or easy bruising Neurological: Denies numbness, tingling or new weaknesses Behavioral/Psych: Mood is stable, no new changes  Extremities: No lower extremity edema Breast: denies any pain or lumps or nodules in either breasts All other systems were reviewed with the patient and are negative.  I have reviewed the past medical history, past surgical history, social history and family history with the patient and they are unchanged from previous note.  ALLERGIES:  is allergic to [employer] tartrate]; food; latex; ramelteon; and tetracyclines & related.  MEDICATIONS:  Current Outpatient Medications  Medication Sig Dispense Refill  . cyclobenzaprine  (FLEXERIL) 10 MG tablet TAKE 1 TABLET (10 MG TOTAL) BY MOUTH 2 (TWO) TIMES DAILY AS NEEDED FOR MUSCLE SPASMS. (Patient taking differently: Take 20 mg by mouth at bedtime. ) 60 tablet 3  . diphenhydramine-acetaminophen (TYLENOL PM) 25-500 MG TABS tablet Take 2 tablets by mouth at bedtime.    Marland Kitchen EPINEPHrine 0.3 mg/0.3 mL IJ SOAJ injection Inject 0.3 mg into the muscle daily as needed for anaphylaxis.  1  . folic acid (FOLVITE) 1 MG tablet Take 1 tablet (1 mg total) by mouth daily. (Patient taking differently: Take 1 mg by mouth at bedtime. ) 90 tablet 3  . levothyroxine (SYNTHROID, LEVOTHROID) 112 MCG tablet TAKE 1 TABLET (112 MCG TOTAL) BY MOUTH DAILY BEFORE BREAKFAST. NEEDS COMPLETE PHYSICAL EXAM 90 tablet 3  . methylcellulose (CITRUCEL) oral powder Take 1 packet by mouth daily as needed (constipation).     . Multiple Vitamin (MULTIVITAMIN WITH MINERALS) TABS tablet Take 1 tablet by mouth at bedtime.    Marland Kitchen omeprazole (PRILOSEC) 20 MG capsule Take 1 capsule (20 mg total) by mouth daily. (Patient taking differently: Take 20 mg by mouth at bedtime. ) 90 capsule 3  . prochlorperazine (COMPAZINE) 5 MG tablet Take 1 tablet (5 mg total) by mouth every 6 (six) hours as needed for nausea or vomiting. 30 tablet 0   No current facility-administered medications for this visit.     PHYSICAL EXAMINATION: ECOG PERFORMANCE STATUS: 1 - Symptomatic but completely ambulatory  Vitals:   02/14/19 1438  BP: 132/77  Pulse: 67  Resp: 16  Temp: 98.4 F (36.9 C)  SpO2: 100%   Filed Weights   02/14/19 1438  Weight: (!) 324 lb  8 oz (147.2 kg)    GENERAL: alert, no distress and comfortable SKIN: skin color, texture, turgor are normal, no rashes or significant lesions EYES: normal, Conjunctiva are pink and non-injected, sclera clear OROPHARYNX: no exudate, no erythema and lips, buccal mucosa, and tongue normal  NECK: supple, thyroid normal size, non-tender, without nodularity LYMPH: no palpable lymphadenopathy in  the cervical, axillary or inguinal LUNGS: clear to auscultation and percussion with normal breathing effort HEART: regular rate & rhythm and no murmurs and no lower extremity edema ABDOMEN: abdomen soft, non-tender and normal bowel sounds MUSCULOSKELETAL: no cyanosis of digits and no clubbing  NEURO: alert & oriented x 3 with fluent speech, no focal motor/sensory deficits EXTREMITIES: No lower extremity edema  LABORATORY DATA:  I have reviewed the data as listed CMP Latest Ref Rng & Units 04/23/2017 09/23/2016 03/15/2015  Glucose 70 - 99 mg/dL 100(H) 99 98  BUN 6 - 23 mg/dL 6 <5(L) 5.5(L)  Creatinine 0.40 - 1.20 mg/dL 0.56 0.55 0.6  Sodium 135 - 145 mEq/L 148(H) 141 143  Potassium 3.5 - 5.1 mEq/L 4.2 3.6 3.8  Chloride 96 - 112 mEq/L 113(H) 107 -  CO2 19 - 32 mEq/L 29 25 28   Calcium 8.4 - 10.5 mg/dL 9.0 8.5(L) 8.5  Total Protein 6.0 - 8.3 g/dL 7.1 - 6.3(L)  Total Bilirubin 0.2 - 1.2 mg/dL 1.0 - 1.36(H)  Alkaline Phos 39 - 117 U/L 140(H) - 146  AST 0 - 37 U/L 14 - 16  ALT 0 - 35 U/L 11 - 15    Lab Results  Component Value Date   WBC 6.2 02/14/2019   HGB 13.5 02/14/2019   HCT 40.9 02/14/2019   MCV 96.0 02/14/2019   PLT 198 02/14/2019   NEUTROABS 3.0 02/14/2019    ASSESSMENT & PLAN:  Vitamin B 12 deficiency Due to gastric bypass surgery.   B12 levels May 2019: 98 B12 level October 2019: 68 B12 01/18/2019: 130  Today's B12 level is pending.  Treatment summary: B12 weekly x4 started 09/21/2018, then every 2 weeks x4, now monthly Folic acid deficiency: Over-the-counter folic acid supplementation  Iron deficiency anemia also due to gastric bypass surgery: IV iron given July 2019, surprisingly iron levels are stable and does not require IV iron therapy.  Prior history of 3 episodes of DVT: Took anticoagulation 1996 2006, has an IVC filter Return to clinic monthly for B12 injections and every 6 months for follow-up with me.    Orders Placed This Encounter  Procedures  . CBC  with Differential (Cancer Center Only)    Standing Status:   Future    Standing Expiration Date:   02/15/2020  . Vitamin B12    Standing Status:   Future    Standing Expiration Date:   02/14/2020  . Iron and TIBC    Standing Status:   Future    Standing Expiration Date:   02/14/2020  . Ferritin    Standing Status:   Future    Standing Expiration Date:   02/14/2020   The patient has a good understanding of the overall plan. she agrees with it. she will call with any problems that may develop before the next visit here.  Nicholas Lose, MD 02/14/2019  Julious Oka Dorshimer am acting as scribe for Dr. Nicholas Lose.  I have reviewed the above documentation for accuracy and completeness, and I agree with the above.

## 2019-02-14 ENCOUNTER — Inpatient Hospital Stay: Payer: Medicare Other

## 2019-02-14 ENCOUNTER — Inpatient Hospital Stay (HOSPITAL_BASED_OUTPATIENT_CLINIC_OR_DEPARTMENT_OTHER): Payer: Medicare Other | Admitting: Hematology and Oncology

## 2019-02-14 VITALS — BP 132/77 | HR 67 | Temp 98.4°F | Resp 16 | Ht 63.75 in | Wt 324.5 lb

## 2019-02-14 DIAGNOSIS — D508 Other iron deficiency anemias: Secondary | ICD-10-CM | POA: Diagnosis not present

## 2019-02-14 DIAGNOSIS — E538 Deficiency of other specified B group vitamins: Secondary | ICD-10-CM

## 2019-02-14 DIAGNOSIS — Z79899 Other long term (current) drug therapy: Secondary | ICD-10-CM | POA: Diagnosis not present

## 2019-02-14 DIAGNOSIS — Z9884 Bariatric surgery status: Secondary | ICD-10-CM

## 2019-02-14 LAB — CBC WITH DIFFERENTIAL (CANCER CENTER ONLY)
Abs Immature Granulocytes: 0.01 10*3/uL (ref 0.00–0.07)
BASOS PCT: 1 %
Basophils Absolute: 0 10*3/uL (ref 0.0–0.1)
EOS ABS: 0.2 10*3/uL (ref 0.0–0.5)
Eosinophils Relative: 3 %
HCT: 40.9 % (ref 36.0–46.0)
Hemoglobin: 13.5 g/dL (ref 12.0–15.0)
Immature Granulocytes: 0 %
Lymphocytes Relative: 40 %
Lymphs Abs: 2.5 10*3/uL (ref 0.7–4.0)
MCH: 31.7 pg (ref 26.0–34.0)
MCHC: 33 g/dL (ref 30.0–36.0)
MCV: 96 fL (ref 80.0–100.0)
Monocytes Absolute: 0.5 10*3/uL (ref 0.1–1.0)
Monocytes Relative: 7 %
Neutro Abs: 3 10*3/uL (ref 1.7–7.7)
Neutrophils Relative %: 49 %
PLATELETS: 198 10*3/uL (ref 150–400)
RBC: 4.26 MIL/uL (ref 3.87–5.11)
RDW: 12.6 % (ref 11.5–15.5)
WBC Count: 6.2 10*3/uL (ref 4.0–10.5)
nRBC: 0 % (ref 0.0–0.2)

## 2019-02-14 LAB — VITAMIN B12: Vitamin B-12: 359 pg/mL (ref 180–914)

## 2019-02-14 MED ORDER — CYANOCOBALAMIN 1000 MCG/ML IJ SOLN
1000.0000 ug | Freq: Once | INTRAMUSCULAR | Status: AC
  Administered 2019-02-14: 1000 ug via INTRAMUSCULAR

## 2019-02-14 NOTE — Patient Instructions (Signed)
Cyanocobalamin, Vitamin B12 injection What is this medicine? CYANOCOBALAMIN (sye an oh koe BAL a min) is a man made form of vitamin B12. Vitamin B12 is used in the growth of healthy blood cells, nerve cells, and proteins in the body. It also helps with the metabolism of fats and carbohydrates. This medicine is used to treat people who can not absorb vitamin B12. This medicine may be used for other purposes; ask your health care provider or pharmacist if you have questions. COMMON BRAND NAME(S): B-12 Compliance Kit, B-12 Injection Kit, Cyomin, LA-12, Nutri-Twelve, Physicians EZ Use B-12, Primabalt What should I tell my health care provider before I take this medicine? They need to know if you have any of these conditions: -kidney disease -Leber's disease -megaloblastic anemia -an unusual or allergic reaction to cyanocobalamin, cobalt, other medicines, foods, dyes, or preservatives -pregnant or trying to get pregnant -breast-feeding How should I use this medicine? This medicine is injected into a muscle or deeply under the skin. It is usually given by a health care professional in a clinic or doctor's office. However, your doctor may teach you how to inject yourself. Follow all instructions. Talk to your pediatrician regarding the use of this medicine in children. Special care may be needed. Overdosage: If you think you have taken too much of this medicine contact a poison control center or emergency room at once. NOTE: This medicine is only for you. Do not share this medicine with others. What if I miss a dose? If you are given your dose at a clinic or doctor's office, call to reschedule your appointment. If you give your own injections and you miss a dose, take it as soon as you can. If it is almost time for your next dose, take only that dose. Do not take double or extra doses. What may interact with this medicine? -colchicine -heavy alcohol intake This list may not describe all possible  interactions. Give your health care provider a list of all the medicines, herbs, non-prescription drugs, or dietary supplements you use. Also tell them if you smoke, drink alcohol, or use illegal drugs. Some items may interact with your medicine. What should I watch for while using this medicine? Visit your doctor or health care professional regularly. You may need blood work done while you are taking this medicine. You may need to follow a special diet. Talk to your doctor. Limit your alcohol intake and avoid smoking to get the best benefit. What side effects may I notice from receiving this medicine? Side effects that you should report to your doctor or health care professional as soon as possible: -allergic reactions like skin rash, itching or hives, swelling of the face, lips, or tongue -blue tint to skin -chest tightness, pain -difficulty breathing, wheezing -dizziness -red, swollen painful area on the leg Side effects that usually do not require medical attention (report to your doctor or health care professional if they continue or are bothersome): -diarrhea -headache This list may not describe all possible side effects. Call your doctor for medical advice about side effects. You may report side effects to FDA at 1-800-FDA-1088. Where should I keep my medicine? Keep out of the reach of children. Store at room temperature between 15 and 30 degrees C (59 and 85 degrees F). Protect from light. Throw away any unused medicine after the expiration date. NOTE: This sheet is a summary. It may not cover all possible information. If you have questions about this medicine, talk to your doctor, pharmacist, or   health care provider.  2019 Elsevier/Gold Standard (2008-03-19 22:10:20)  

## 2019-02-14 NOTE — Assessment & Plan Note (Signed)
Due to gastric bypass surgery.   B12 levels May 2019: 98 B12 level October 2019: 68  Treatment summary: B12 weekly x4 started 09/21/2018, then every 2 weeks x4, now monthly Folic acid deficiency: Over-the-counter folic acid supplementation  Iron deficiency anemia also due to gastric bypass surgery: IV iron given July 2019, surprisingly iron levels are stable and does not require IV iron therapy.  Prior history of 3 episodes of DVT: Took anticoagulation 1996 2006, has an IVC filter Return to clinic monthly for B12 injections and every 6 months for follow-up with me.

## 2019-02-15 ENCOUNTER — Ambulatory Visit: Payer: Medicare Other

## 2019-02-26 ENCOUNTER — Other Ambulatory Visit: Payer: Self-pay | Admitting: Internal Medicine

## 2019-02-27 NOTE — Telephone Encounter (Signed)
Last OV 09-22-18 No Future OV

## 2019-03-15 ENCOUNTER — Other Ambulatory Visit: Payer: Self-pay

## 2019-03-15 ENCOUNTER — Inpatient Hospital Stay: Payer: Medicare Other | Attending: Hematology and Oncology

## 2019-03-15 VITALS — BP 144/75 | HR 70 | Temp 98.1°F | Resp 18

## 2019-03-15 DIAGNOSIS — D508 Other iron deficiency anemias: Secondary | ICD-10-CM | POA: Insufficient documentation

## 2019-03-15 DIAGNOSIS — E538 Deficiency of other specified B group vitamins: Secondary | ICD-10-CM | POA: Insufficient documentation

## 2019-03-15 MED ORDER — CYANOCOBALAMIN 1000 MCG/ML IJ SOLN
INTRAMUSCULAR | Status: AC
  Filled 2019-03-15: qty 1

## 2019-03-15 MED ORDER — CYANOCOBALAMIN 1000 MCG/ML IJ SOLN
1000.0000 ug | Freq: Once | INTRAMUSCULAR | Status: AC
  Administered 2019-03-15: 1000 ug via INTRAMUSCULAR

## 2019-04-06 ENCOUNTER — Telehealth: Payer: Self-pay | Admitting: Hematology and Oncology

## 2019-04-06 NOTE — Telephone Encounter (Signed)
Unable to reach patient - left message for patient to call back to reschedule.

## 2019-04-10 ENCOUNTER — Encounter: Payer: Self-pay | Admitting: Hematology and Oncology

## 2019-04-11 ENCOUNTER — Other Ambulatory Visit: Payer: Self-pay

## 2019-04-11 ENCOUNTER — Inpatient Hospital Stay: Payer: Medicare Other | Attending: Hematology and Oncology

## 2019-04-11 DIAGNOSIS — E538 Deficiency of other specified B group vitamins: Secondary | ICD-10-CM | POA: Insufficient documentation

## 2019-04-11 MED ORDER — CYANOCOBALAMIN 1000 MCG/ML IJ SOLN
1000.0000 ug | Freq: Once | INTRAMUSCULAR | Status: AC
  Administered 2019-04-11: 14:00:00 1000 ug via INTRAMUSCULAR

## 2019-04-11 MED ORDER — CYANOCOBALAMIN 1000 MCG/ML IJ SOLN
INTRAMUSCULAR | Status: AC
  Filled 2019-04-11: qty 1

## 2019-04-14 ENCOUNTER — Ambulatory Visit: Payer: Medicare Other

## 2019-05-10 ENCOUNTER — Encounter: Payer: Medicare Other | Admitting: Internal Medicine

## 2019-05-16 ENCOUNTER — Inpatient Hospital Stay: Payer: Medicare Other | Attending: Hematology and Oncology

## 2019-05-16 ENCOUNTER — Other Ambulatory Visit: Payer: Self-pay

## 2019-05-16 DIAGNOSIS — E538 Deficiency of other specified B group vitamins: Secondary | ICD-10-CM

## 2019-05-16 MED ORDER — CYANOCOBALAMIN 1000 MCG/ML IJ SOLN
1000.0000 ug | Freq: Once | INTRAMUSCULAR | Status: AC
  Administered 2019-05-16: 1000 ug via INTRAMUSCULAR

## 2019-05-16 MED ORDER — CYANOCOBALAMIN 1000 MCG/ML IJ SOLN
INTRAMUSCULAR | Status: AC
  Filled 2019-05-16: qty 1

## 2019-05-16 NOTE — Patient Instructions (Signed)
Cyanocobalamin, Vitamin B12 injection What is this medicine? CYANOCOBALAMIN (sye an oh koe BAL a min) is a man made form of vitamin B12. Vitamin B12 is used in the growth of healthy blood cells, nerve cells, and proteins in the body. It also helps with the metabolism of fats and carbohydrates. This medicine is used to treat people who can not absorb vitamin B12. This medicine may be used for other purposes; ask your health care provider or pharmacist if you have questions. COMMON BRAND NAME(S): B-12 Compliance Kit, B-12 Injection Kit, Cyomin, LA-12, Nutri-Twelve, Physicians EZ Use B-12, Primabalt What should I tell my health care provider before I take this medicine? They need to know if you have any of these conditions: -kidney disease -Leber's disease -megaloblastic anemia -an unusual or allergic reaction to cyanocobalamin, cobalt, other medicines, foods, dyes, or preservatives -pregnant or trying to get pregnant -breast-feeding How should I use this medicine? This medicine is injected into a muscle or deeply under the skin. It is usually given by a health care professional in a clinic or doctor's office. However, your doctor may teach you how to inject yourself. Follow all instructions. Talk to your pediatrician regarding the use of this medicine in children. Special care may be needed. Overdosage: If you think you have taken too much of this medicine contact a poison control center or emergency room at once. NOTE: This medicine is only for you. Do not share this medicine with others. What if I miss a dose? If you are given your dose at a clinic or doctor's office, call to reschedule your appointment. If you give your own injections and you miss a dose, take it as soon as you can. If it is almost time for your next dose, take only that dose. Do not take double or extra doses. What may interact with this medicine? -colchicine -heavy alcohol intake This list may not describe all possible  interactions. Give your health care provider a list of all the medicines, herbs, non-prescription drugs, or dietary supplements you use. Also tell them if you smoke, drink alcohol, or use illegal drugs. Some items may interact with your medicine. What should I watch for while using this medicine? Visit your doctor or health care professional regularly. You may need blood work done while you are taking this medicine. You may need to follow a special diet. Talk to your doctor. Limit your alcohol intake and avoid smoking to get the best benefit. What side effects may I notice from receiving this medicine? Side effects that you should report to your doctor or health care professional as soon as possible: -allergic reactions like skin rash, itching or hives, swelling of the face, lips, or tongue -blue tint to skin -chest tightness, pain -difficulty breathing, wheezing -dizziness -red, swollen painful area on the leg Side effects that usually do not require medical attention (report to your doctor or health care professional if they continue or are bothersome): -diarrhea -headache This list may not describe all possible side effects. Call your doctor for medical advice about side effects. You may report side effects to FDA at 1-800-FDA-1088. Where should I keep my medicine? Keep out of the reach of children. Store at room temperature between 15 and 30 degrees C (59 and 85 degrees F). Protect from light. Throw away any unused medicine after the expiration date. NOTE: This sheet is a summary. It may not cover all possible information. If you have questions about this medicine, talk to your doctor, pharmacist, or   health care provider.  2018 Elsevier/Gold Standard (2008-03-19 22:10:20)  

## 2019-06-15 ENCOUNTER — Inpatient Hospital Stay: Payer: Medicare Other | Attending: Hematology and Oncology

## 2019-06-15 ENCOUNTER — Telehealth: Payer: Self-pay | Admitting: Hematology and Oncology

## 2019-06-15 ENCOUNTER — Ambulatory Visit: Payer: Medicare Other

## 2019-06-15 ENCOUNTER — Other Ambulatory Visit: Payer: Self-pay

## 2019-06-15 VITALS — BP 138/72 | HR 72 | Temp 98.2°F | Resp 18

## 2019-06-15 DIAGNOSIS — E538 Deficiency of other specified B group vitamins: Secondary | ICD-10-CM | POA: Diagnosis not present

## 2019-06-15 MED ORDER — CYANOCOBALAMIN 1000 MCG/ML IJ SOLN
INTRAMUSCULAR | Status: AC
  Filled 2019-06-15: qty 1

## 2019-06-15 MED ORDER — CYANOCOBALAMIN 1000 MCG/ML IJ SOLN
1000.0000 ug | Freq: Once | INTRAMUSCULAR | Status: AC
  Administered 2019-06-15: 1000 ug via INTRAMUSCULAR

## 2019-06-15 NOTE — Patient Instructions (Signed)
Cyanocobalamin, Vitamin B12 injection What is this medicine? CYANOCOBALAMIN (sye an oh koe BAL a min) is a man made form of vitamin B12. Vitamin B12 is used in the growth of healthy blood cells, nerve cells, and proteins in the body. It also helps with the metabolism of fats and carbohydrates. This medicine is used to treat people who can not absorb vitamin B12. This medicine may be used for other purposes; ask your health care provider or pharmacist if you have questions. COMMON BRAND NAME(S): B-12 Compliance Kit, B-12 Injection Kit, Cyomin, LA-12, Nutri-Twelve, Physicians EZ Use B-12, Primabalt What should I tell my health care provider before I take this medicine? They need to know if you have any of these conditions: -kidney disease -Leber's disease -megaloblastic anemia -an unusual or allergic reaction to cyanocobalamin, cobalt, other medicines, foods, dyes, or preservatives -pregnant or trying to get pregnant -breast-feeding How should I use this medicine? This medicine is injected into a muscle or deeply under the skin. It is usually given by a health care professional in a clinic or doctor's office. However, your doctor may teach you how to inject yourself. Follow all instructions. Talk to your pediatrician regarding the use of this medicine in children. Special care may be needed. Overdosage: If you think you have taken too much of this medicine contact a poison control center or emergency room at once. NOTE: This medicine is only for you. Do not share this medicine with others. What if I miss a dose? If you are given your dose at a clinic or doctor's office, call to reschedule your appointment. If you give your own injections and you miss a dose, take it as soon as you can. If it is almost time for your next dose, take only that dose. Do not take double or extra doses. What may interact with this medicine? -colchicine -heavy alcohol intake This list may not describe all possible  interactions. Give your health care provider a list of all the medicines, herbs, non-prescription drugs, or dietary supplements you use. Also tell them if you smoke, drink alcohol, or use illegal drugs. Some items may interact with your medicine. What should I watch for while using this medicine? Visit your doctor or health care professional regularly. You may need blood work done while you are taking this medicine. You may need to follow a special diet. Talk to your doctor. Limit your alcohol intake and avoid smoking to get the best benefit. What side effects may I notice from receiving this medicine? Side effects that you should report to your doctor or health care professional as soon as possible: -allergic reactions like skin rash, itching or hives, swelling of the face, lips, or tongue -blue tint to skin -chest tightness, pain -difficulty breathing, wheezing -dizziness -red, swollen painful area on the leg Side effects that usually do not require medical attention (report to your doctor or health care professional if they continue or are bothersome): -diarrhea -headache This list may not describe all possible side effects. Call your doctor for medical advice about side effects. You may report side effects to FDA at 1-800-FDA-1088. Where should I keep my medicine? Keep out of the reach of children. Store at room temperature between 15 and 30 degrees C (59 and 85 degrees F). Protect from light. Throw away any unused medicine after the expiration date. NOTE: This sheet is a summary. It may not cover all possible information. If you have questions about this medicine, talk to your doctor, pharmacist, or   health care provider.  2018 Elsevier/Gold Standard (2008-03-19 22:10:20)  

## 2019-06-15 NOTE — Telephone Encounter (Signed)
Patient came in to reschedule 7/24 appt due to wedding.

## 2019-06-28 ENCOUNTER — Other Ambulatory Visit: Payer: Self-pay | Admitting: Internal Medicine

## 2019-06-28 NOTE — Telephone Encounter (Signed)
Patient is requesting FLEXERIL. Last appt 09/22/18. Last refill 02/27/19

## 2019-06-28 NOTE — Telephone Encounter (Signed)
Please set her up for a AMW soon

## 2019-07-03 ENCOUNTER — Ambulatory Visit (INDEPENDENT_AMBULATORY_CARE_PROVIDER_SITE_OTHER): Payer: Medicare Other | Admitting: Internal Medicine

## 2019-07-03 ENCOUNTER — Encounter: Payer: Self-pay | Admitting: Internal Medicine

## 2019-07-03 DIAGNOSIS — E039 Hypothyroidism, unspecified: Secondary | ICD-10-CM | POA: Diagnosis not present

## 2019-07-03 DIAGNOSIS — K2100 Gastro-esophageal reflux disease with esophagitis, without bleeding: Secondary | ICD-10-CM

## 2019-07-03 DIAGNOSIS — G479 Sleep disorder, unspecified: Secondary | ICD-10-CM

## 2019-07-03 DIAGNOSIS — Z0000 Encounter for general adult medical examination without abnormal findings: Secondary | ICD-10-CM

## 2019-07-03 DIAGNOSIS — Q0700 Arnold-Chiari syndrome without spina bifida or hydrocephalus: Secondary | ICD-10-CM

## 2019-07-03 NOTE — Assessment & Plan Note (Signed)
Quiet on the PPI 

## 2019-07-03 NOTE — Assessment & Plan Note (Signed)
Asked her to have Dr Lindi Adie add thyroid function on her next blood work

## 2019-07-03 NOTE — Progress Notes (Signed)
Subjective:    Patient ID: [name], female    DOB: 15-Aug-1961, 58 y.o.   MRN: [id number]  HPI Visit for Medicare wellness and follow up of chronic health conditions  Interactive audio and video telecommunications were attempted between this provider and patient, however failed, due to patient having technical difficulties OR patient did not have access to video capability.  We continued and completed visit with audio only.   Virtual Visit via Video Note  I connected with [name] on 07/03/19 at 10:45 AM EDT by a video enabled telemedicine application and verified that I am speaking with the correct person using two identifiers.  Location: Patient: home Provider: office   I discussed the limitations of evaluation and management by telemedicine and the availability of in person appointments. The patient expressed understanding and agreed to proceed.  History of Present Illness: Discussed ---not able to do Medicare wellness Did review her preventative issues  Hoping to go back to her work Awaiting the decision about school opening  Never had the revision of her bariatric surgery Had been awaiting the end of the school year Needs to get back with the surgeon Does plan to pursue this  She feels her thyroid has been okay She does have another spot there---will be following up with Dr Ignacia Bayley  Now getting injections of B12 with Dr Lindi Adie Will be getting rechecked soon  Continues on the omeprazole No heartburn or dysphagia on that  Will occasionally get occipital headache Reviewed last MRI---Chiari malformation repair intact Not set up for neurosurgical follow up  Current Outpatient Medications on File Prior to Visit  Medication Sig Dispense Refill  . cyclobenzaprine (FLEXERIL) 10 MG tablet TAKE 1 TABLET BY MOUTH TWICE A DAY AS NEEDED FOR MUSCLE SPASMS (Patient taking differently: Take 20 mg by mouth. 2 tablets at bedtime nightly.) 60 tablet 0  .  diphenhydramine-acetaminophen (TYLENOL PM) 25-500 MG TABS tablet Take 2 tablets by mouth at bedtime.    Marland Kitchen EPINEPHrine 0.3 mg/0.3 mL IJ SOAJ injection Inject 0.3 mg into the muscle daily as needed for anaphylaxis.  1  . folic acid (FOLVITE) 1 MG tablet Take 1 tablet (1 mg total) by mouth daily. (Patient taking differently: Take 1 mg by mouth at bedtime. ) 90 tablet 3  . levothyroxine (SYNTHROID, LEVOTHROID) 112 MCG tablet TAKE 1 TABLET (112 MCG TOTAL) BY MOUTH DAILY BEFORE BREAKFAST. NEEDS COMPLETE PHYSICAL EXAM 90 tablet 3  . methylcellulose (CITRUCEL) oral powder Take 1 packet by mouth daily as needed (constipation).     . Multiple Vitamin (MULTIVITAMIN WITH MINERALS) TABS tablet Take 1 tablet by mouth at bedtime.    Marland Kitchen omeprazole (PRILOSEC) 20 MG capsule Take 1 capsule (20 mg total) by mouth daily. (Patient taking differently: Take 20 mg by mouth at bedtime. ) 90 capsule 3  . prochlorperazine (COMPAZINE) 5 MG tablet Take 1 tablet (5 mg total) by mouth every 6 (six) hours as needed for nausea or vomiting. 30 tablet 0   No current facility-administered medications on file prior to visit.     Allergies  Allergen Reactions  . Ambien [Zolpidem Tartrate]     [employer]  . Food Anaphylaxis, Diarrhea and Nausea And Vomiting    Bananas-anaphylactic Kiwi-anaphylactic Mushroom--nausea/vomiting Beer- anaphylactic  . Latex Other (See Comments)    REACTION: "anaphylactic shock"  . Ramelteon Other (See Comments)    REACTION: blacks out ([place])  . Tetracyclines & Related Anaphylaxis and Other (See Comments)    Syncope  Past Medical History:  Diagnosis Date  . Allergy   . Anemia   . Anxiety   . Chronic venous insufficiency   . Depression   . Family history of adverse reaction to anesthesia   . GERD (gastroesophageal reflux disease)   . [place] filter in place   . History of DVT (deep vein thrombosis)   . Hypothyroidism   . Thyroid cancer [facility])     Past Surgical History:   Procedure Laterality Date  . ABDOMINAL HYSTERECTOMY    . BALLOON DILATION N/A 12/08/2018   Procedure: BALLOON DILATION;  Surgeon: Milus Banister, MD;  Location: [facility];  Service: Endoscopy;  Laterality: N/A;  . BIOPSY THYROID  01/03   suggestive of Hashimoto's  . CESAREAN SECTION  1998  . CHOLECYSTECTOMY  1982  . COLONOSCOPY WITH PROPOFOL N/A 12/08/2018   Procedure: COLONOSCOPY WITH PROPOFOL;  Surgeon: Milus Banister, MD;  Location: [facility];  Service: Endoscopy;  Laterality: N/A;  . CRANIECTOMY SUBOCCIPITAL W/ CERVICAL LAMINECTOMY / CHIARI  05/2010   repair, Dr.Pool  . DILATION AND CURETTAGE OF UTERUS  2003  . ESOPHAGOGASTRODUODENOSCOPY (EGD) WITH PROPOFOL N/A 12/08/2018   Procedure: ESOPHAGOGASTRODUODENOSCOPY (EGD) WITH PROPOFOL;  Surgeon: Milus Banister, MD;  Location: [facility];  Service: Endoscopy;  Laterality: N/A;  . FETAL SURGERY FOR CONGENITAL HERNIA    . GASTRIC BYPASS    . POLYPECTOMY  12/08/2018   Procedure: POLYPECTOMY;  Surgeon: Milus Banister, MD;  Location: [facility];  Service: Endoscopy;;  . THYROIDECTOMY  2008   left  . TONSILLECTOMY  1965  . TUBAL LIGATION  1998  . VEIN LIGATION  1995/1998   right leg    Family History  Problem Relation Age of Onset  . Arthritis Mother   . Coronary artery disease Father   . Kidney cancer Father   . Lung cancer Father   . Coronary artery disease Sister   . Ovarian cancer Maternal Grandmother   . Thyroid disease Neg Hx   . Colon cancer Neg Hx   . Esophageal cancer Neg Hx   . Pancreatic cancer Neg Hx   . Stomach cancer Neg Hx     Social History   Socioeconomic History  . Marital status: Divorced    Spouse name: Not on file  . Number of children: 3  . Years of education: Not on file  . Highest education level: Not on file  Occupational History  . Occupation: Part time Environmental manager  Social Needs  . Financial resource strain: Not on file  . Food insecurity    Worry: Not on file     Inability: Not on file  . Transportation needs    Medical: Not on file    Non-medical: Not on file  Tobacco Use  . Smoking status: Never Smoker  . Smokeless tobacco: Never Used  Substance and Sexual Activity  . Alcohol use: Yes    Alcohol/week: 0.0 standard drinks    Comment: rare  . Drug use: No  . Sexual activity: Not on file  Lifestyle  . Physical activity    Days per week: Not on file    Minutes per session: Not on file  . Stress: Not on file  Relationships  . Social Herbalist on phone: Not on file    Gets together: Not on file    Attends religious service: Not on file    Active member of club or organization: Not on file    Attends  meetings of clubs or organizations: Not on file    Relationship status: Not on file  . Intimate partner violence    Fear of current or ex partner: Not on file    Emotionally abused: Not on file    Physically abused: Not on file    Forced sexual activity: Not on file  Other Topics Concern  . Not on file  Social History Narrative   No living will   Requests mother to make health care decision   Would accept resuscitation attempts   Not sure about tube feeds         ROS Wonders about ADHD--trouble concentrating on one task. Discussed the diagnostic criteria Weight continues to go up Not sleeping well---takes tylenol PM and it helps her get up to 4 hours a night Discussed trying melatonin  Observations/Objective: Normal conversation No respiratory difficulty  Assessment and Plan: See problem list  Follow Up Instructions:    I discussed the assessment and treatment plan with the patient. The patient was provided an opportunity to ask questions and all were answered. The patient agreed with the plan and demonstrated an understanding of the instructions.   The patient was advised to call back or seek an in-person evaluation if the symptoms worsen or if the condition fails to improve as anticipated.  I provided 24  minutes of non-face-to-face time during this encounter.   [name], MD  Review of Systems     Objective:   Physical Exam         Assessment & Plan:

## 2019-07-03 NOTE — Assessment & Plan Note (Signed)
Will need to reschedule her bariatric revision

## 2019-07-03 NOTE — Assessment & Plan Note (Signed)
Gets headache but no clear evidence of recurrence She will check with her neurosurgeon to see if she needs repeat imaging

## 2019-07-03 NOTE — Progress Notes (Signed)
Hearing Screening   125Hz  250Hz  500Hz  1000Hz  2000Hz  3000Hz  4000Hz  6000Hz  8000Hz   Right ear:           Left ear:           Comments: Not checked in the last 40months  Vision Screening Comments: Not checked in the last 29months

## 2019-07-03 NOTE — Assessment & Plan Note (Signed)
Discussed trying melatonin 5-10mg 

## 2019-07-03 NOTE — Assessment & Plan Note (Signed)
Urged her to set up mammogram Just had colon--due again in 5 years Flu vaccine in the fall Not able to exercise at this point

## 2019-07-11 ENCOUNTER — Other Ambulatory Visit: Payer: Self-pay

## 2019-07-11 ENCOUNTER — Telehealth: Payer: Self-pay | Admitting: Internal Medicine

## 2019-07-11 ENCOUNTER — Other Ambulatory Visit: Payer: Self-pay | Admitting: Internal Medicine

## 2019-07-11 ENCOUNTER — Inpatient Hospital Stay: Payer: Medicare Other | Attending: Hematology and Oncology

## 2019-07-11 VITALS — BP 124/66 | HR 65 | Temp 98.2°F | Resp 18

## 2019-07-11 DIAGNOSIS — E538 Deficiency of other specified B group vitamins: Secondary | ICD-10-CM | POA: Diagnosis not present

## 2019-07-11 MED ORDER — CYANOCOBALAMIN 1000 MCG/ML IJ SOLN
1000.0000 ug | Freq: Once | INTRAMUSCULAR | Status: AC
  Administered 2019-07-11: 10:00:00 1000 ug via INTRAMUSCULAR

## 2019-07-11 MED ORDER — CYANOCOBALAMIN 1000 MCG/ML IJ SOLN
INTRAMUSCULAR | Status: AC
  Filled 2019-07-11: qty 1

## 2019-07-11 NOTE — Telephone Encounter (Signed)
I left a message on patient's voice mail to call back and schedule next weeks Annual Wellness Visit.

## 2019-07-11 NOTE — Patient Instructions (Signed)
Cyanocobalamin, Pyridoxine, and Folate What is this medicine? A multivitamin containing folic acid, vitamin B6, and vitamin B12. This medicine may be used for other purposes; ask your health care provider or pharmacist if you have questions. COMMON BRAND NAME(S): AllanFol RX, AllanTex, Av-Vite FB, B Complex with Folic Acid, ComBgen, FaBB, Folamin, Folastin, Folbalin, Folbee, Folbic, Folcaps, Folgard, Folgard RX, Folgard RX 2.2, Folplex, Folplex 2.2, Foltabs 800, Foltx, Homocysteine Formula, Niva-Fol, NuFol, TL Gard RX, Virt-Gard, Virt-Vite, Virt-Vite Forte, Vita-Respa What should I tell my health care provider before I take this medicine? They need to know if you have any of these conditions:  bleeding or clotting disorder  history of anemia of any type  other chronic health condition  an unusual or allergic reaction to vitamins, other medicines, foods, dyes, or preservatives  pregnant or trying to get pregnant  breast-feeding How should I use this medicine? Take by mouth with a glass of water. May take with food. Follow the directions on the prescription label. It is usually given once a day. Do not take your medicine more often than directed. Contact your pediatrician regarding the use of this medicine in children. Special care may be needed. Overdosage: If you think you have taken too much of this medicine contact a poison control center or emergency room at once. NOTE: This medicine is only for you. Do not share this medicine with others. What if I miss a dose? If you miss a dose, take it as soon as you can. If it is almost time for your next dose, take only that dose. Do not take double or extra doses. What may interact with this medicine?  levodopa This list may not describe all possible interactions. Give your health care provider a list of all the medicines, herbs, non-prescription drugs, or dietary supplements you use. Also tell them if you smoke, drink alcohol, or use illegal  drugs. Some items may interact with your medicine. What should I watch for while using this medicine? See your health care professional for regular checks on your progress. Remember that vitamin supplements do not replace the need for good nutrition from a balanced diet. What side effects may I notice from receiving this medicine? Side effects that you should report to your doctor or health care professional as soon as possible:  allergic reaction such as skin rash or difficulty breathing  vomiting Side effects that usually do not require medical attention (report to your doctor or health care professional if they continue or are bothersome):  nausea  stomach upset This list may not describe all possible side effects. Call your doctor for medical advice about side effects. You may report side effects to FDA at 1-800-FDA-1088. Where should I keep my medicine? Keep out of the reach of children. Most vitamins should be stored at controlled room temperature. Check your specific product directions. Protect from heat and moisture. Throw away any unused medicine after the expiration date. NOTE: This sheet is a summary. It may not cover all possible information. If you have questions about this medicine, talk to your doctor, pharmacist, or health care provider.  2020 Elsevier/Gold Standard (2008-01-28 00:59:55)  

## 2019-07-13 NOTE — Telephone Encounter (Signed)
I've tried several time to reach patient, but she doesn't answer. I left another detailed message on her voice mail to call back and schedule her annual wellness visit with Dr.Letvak after 07/02/20.

## 2019-07-14 ENCOUNTER — Ambulatory Visit: Payer: Medicare Other

## 2019-07-14 ENCOUNTER — Ambulatory Visit: Payer: Self-pay

## 2019-07-17 ENCOUNTER — Other Ambulatory Visit: Payer: Self-pay

## 2019-07-17 ENCOUNTER — Encounter: Payer: Self-pay | Admitting: Hematology and Oncology

## 2019-07-17 DIAGNOSIS — D508 Other iron deficiency anemias: Secondary | ICD-10-CM

## 2019-07-17 DIAGNOSIS — E538 Deficiency of other specified B group vitamins: Secondary | ICD-10-CM

## 2019-07-18 ENCOUNTER — Ambulatory Visit (INDEPENDENT_AMBULATORY_CARE_PROVIDER_SITE_OTHER): Payer: Medicare Other | Admitting: Family Medicine

## 2019-07-18 ENCOUNTER — Other Ambulatory Visit: Payer: Self-pay

## 2019-07-18 ENCOUNTER — Encounter: Payer: Self-pay | Admitting: Family Medicine

## 2019-07-18 VITALS — BP 124/86 | HR 79 | Temp 97.9°F | Ht 63.75 in | Wt 334.1 lb

## 2019-07-18 DIAGNOSIS — D508 Other iron deficiency anemias: Secondary | ICD-10-CM

## 2019-07-18 DIAGNOSIS — R1032 Left lower quadrant pain: Secondary | ICD-10-CM | POA: Diagnosis not present

## 2019-07-18 DIAGNOSIS — R8290 Unspecified abnormal findings in urine: Secondary | ICD-10-CM

## 2019-07-18 LAB — COMPREHENSIVE METABOLIC PANEL
ALT: 13 U/L (ref 0–35)
AST: 14 U/L (ref 0–37)
Albumin: 4 g/dL (ref 3.5–5.2)
Alkaline Phosphatase: 144 U/L — ABNORMAL HIGH (ref 39–117)
BUN: 4 mg/dL — ABNORMAL LOW (ref 6–23)
CO2: 32 mEq/L (ref 19–32)
Calcium: 7.9 mg/dL — ABNORMAL LOW (ref 8.4–10.5)
Chloride: 103 mEq/L (ref 96–112)
Creatinine, Ser: 0.5 mg/dL (ref 0.40–1.20)
GFR: 126.65 mL/min (ref >60.00)
Glucose, Bld: 101 mg/dL — ABNORMAL HIGH (ref 70–99)
Potassium: 3.4 mEq/L — ABNORMAL LOW (ref 3.5–5.1)
Sodium: 141 mEq/L (ref 135–145)
Total Bilirubin: 1.2 mg/dL (ref 0.2–1.2)
Total Protein: 6.4 g/dL (ref 6.0–8.3)

## 2019-07-18 LAB — POC URINALSYSI DIPSTICK (AUTOMATED)
Bilirubin, UA: NEGATIVE
Blood, UA: NEGATIVE
Glucose, UA: NEGATIVE
Ketones, UA: NEGATIVE
Nitrite, UA: NEGATIVE
Protein, UA: NEGATIVE
Spec Grav, UA: 1.02 (ref 1.010–1.025)
Urobilinogen, UA: 4 E.U./dL — AB
pH, UA: 6 (ref 5.0–8.0)

## 2019-07-18 LAB — CBC WITH DIFFERENTIAL/PLATELET
Basophils Absolute: 0 10*3/uL (ref 0.0–0.1)
Basophils Relative: 0.7 % (ref 0.0–3.0)
Eosinophils Absolute: 0.1 10*3/uL (ref 0.0–0.7)
Eosinophils Relative: 2.7 % (ref 0.0–5.0)
HCT: 40.3 % (ref 36.0–46.0)
Hemoglobin: 13.5 g/dL (ref 12.0–15.0)
Lymphocytes Relative: 41.7 % (ref 12.0–46.0)
Lymphs Abs: 2.2 10*3/uL (ref 0.7–4.0)
MCHC: 33.4 g/dL (ref 30.0–36.0)
MCV: 96 fl (ref 78.0–100.0)
Monocytes Absolute: 0.4 10*3/uL (ref 0.1–1.0)
Monocytes Relative: 8.3 % (ref 3.0–12.0)
Neutro Abs: 2.4 10*3/uL (ref 1.4–7.7)
Neutrophils Relative %: 46.6 % (ref 43.0–77.0)
Platelets: 210 10*3/uL (ref 150.0–400.0)
RBC: 4.2 Mil/uL (ref 3.87–5.11)
RDW: 13.4 % (ref 11.5–15.5)
WBC: 5.2 10*3/uL (ref 4.0–10.5)

## 2019-07-18 NOTE — Assessment & Plan Note (Signed)
Intermittent (fleeting) sometimes intense L groin pain in pt with complex hx of bariatric surgery with complications / and morbid obesity (making exam challenging) Nl hip exam  ua with [facility]-sent for cx  Cmet/cbc Suspect she may need a CT to r/o hernia  No s/s of bowel obst inst to call if symptoms suddenly worsen or change  Further plan to follow

## 2019-07-18 NOTE — Patient Instructions (Signed)
Urinalysis today  Also labs for cbc/ cmet   Then we will decide what to do next If symptoms worsen or change let me know

## 2019-07-18 NOTE — Assessment & Plan Note (Signed)
Cbc today

## 2019-07-18 NOTE — Progress Notes (Signed)
Subjective:    Patient ID: [name], female    DOB: 1961/12/08, 58 y.o.   MRN: [id number]  HPI 58 yo pt of Dr Silvio Pate here with groin pain on the left   Started just over a week ago  Started out of nowhere A throbbing pain - that is intermittent  Sharp in natures  Episodes are fleeting - a few seconds   Position and movement does not seem to make a difference It can happen sitting /lying or walking  Never happened on stairs  No rash  Pain does not shoot down her leg   No h/o hip arthritis or arthritis in general   Frequent-they can happen once per minute- or 20 minutes Wakes her up at night   She tried an oxycodone -helped in the moment   She has had a hernia in the past - thinks that was caused by her gastric bypass surgery   Some urinary frequency - not unusual  No blood in urine No h/o kidney stones   ua has wbc  No blood   She takes 2 flexeril every night   Patient Active Problem List   Diagnosis Date Noted  . Polyp of cecum   . Polyp of ascending colon   . Hemorrhoids   . Benign esophageal stricture   . Vitamin B 12 deficiency 03/15/2015  . Left thyroid nodule 07/31/2014  . Routine general medical examination at a health care facility 07/02/2011  . Lumbar pain with radiation down right leg 02/02/2011  . Arnold-Chiari malformation ([place]) 09/25/2009  . Chronic venous insufficiency 08/27/2009  . Obesity, morbid, BMI 50 or higher ([place]) 06/26/2008  . Sleep disturbance 03/27/2008  . ALLERGIC RHINITIS 09/02/2007  . Hypothyroidism 02/18/2007  . Iron deficiency anemia 02/18/2007  . GERD 02/18/2007  . DVT, HX OF 02/18/2007  . COLONIC POLYPS 10/21/2005   Past Medical History:  Diagnosis Date  . Allergy   . Anemia   . Anxiety   . Chronic venous insufficiency   . Depression   . Family history of adverse reaction to anesthesia   . GERD (gastroesophageal reflux disease)   . [place] filter in place   . History of DVT (deep vein thrombosis)   .  Hypothyroidism   . Thyroid cancer [facility])    Past Surgical History:  Procedure Laterality Date  . ABDOMINAL HYSTERECTOMY    . BALLOON DILATION N/A 12/08/2018   Procedure: BALLOON DILATION;  Surgeon: Milus Banister, MD;  Location: [facility];  Service: Endoscopy;  Laterality: N/A;  . BIOPSY THYROID  01/03   suggestive of Hashimoto's  . CESAREAN SECTION  1998  . CHOLECYSTECTOMY  1982  . COLONOSCOPY WITH PROPOFOL N/A 12/08/2018   Procedure: COLONOSCOPY WITH PROPOFOL;  Surgeon: Milus Banister, MD;  Location: [facility];  Service: Endoscopy;  Laterality: N/A;  . CRANIECTOMY SUBOCCIPITAL W/ CERVICAL LAMINECTOMY / CHIARI  05/2010   repair, Dr.Pool  . DILATION AND CURETTAGE OF UTERUS  2003  . ESOPHAGOGASTRODUODENOSCOPY (EGD) WITH PROPOFOL N/A 12/08/2018   Procedure: ESOPHAGOGASTRODUODENOSCOPY (EGD) WITH PROPOFOL;  Surgeon: Milus Banister, MD;  Location: [facility];  Service: Endoscopy;  Laterality: N/A;  . FETAL SURGERY FOR CONGENITAL HERNIA    . GASTRIC BYPASS    . POLYPECTOMY  12/08/2018   Procedure: POLYPECTOMY;  Surgeon: Milus Banister, MD;  Location: [facility];  Service: Endoscopy;;  . THYROIDECTOMY  2008   left  . TONSILLECTOMY  1965  . TUBAL LIGATION  1998  . VEIN  LIGATION  1995/1998   right leg   Social History   Tobacco Use  . Smoking status: Never Smoker  . Smokeless tobacco: Never Used  Substance Use Topics  . Alcohol use: Yes    Alcohol/week: 0.0 standard drinks    Comment: rare  . Drug use: No   Family History  Problem Relation Age of Onset  . Arthritis Mother   . Coronary artery disease Father   . Kidney cancer Father   . Lung cancer Father   . Coronary artery disease Sister   . Ovarian cancer Maternal Grandmother   . Thyroid disease Neg Hx   . Colon cancer Neg Hx   . Esophageal cancer Neg Hx   . Pancreatic cancer Neg Hx   . Stomach cancer Neg Hx    Allergies  Allergen Reactions  . Ambien [Zolpidem Tartrate]     [employer]  . Food  Anaphylaxis, Diarrhea and Nausea And Vomiting    Bananas-anaphylactic Kiwi-anaphylactic Mushroom--nausea/vomiting Beer- anaphylactic  . Latex Other (See Comments)    REACTION: "anaphylactic shock"  . Ramelteon Other (See Comments)    REACTION: blacks out ([place])  . Tetracyclines & Related Anaphylaxis and Other (See Comments)    Syncope    Current Outpatient Medications on File Prior to Visit  Medication Sig Dispense Refill  . cyclobenzaprine (FLEXERIL) 10 MG tablet TAKE 1 TABLET BY MOUTH TWICE A DAY AS NEEDED FOR MUSCLE SPASMS (Patient taking differently: Take 20 mg by mouth. 2 tablets at bedtime nightly.) 60 tablet 0  . diphenhydramine-acetaminophen (TYLENOL PM) 25-500 MG TABS tablet Take 2 tablets by mouth at bedtime.    Marland Kitchen EPINEPHrine 0.3 mg/0.3 mL IJ SOAJ injection Inject 0.3 mg into the muscle daily as needed for anaphylaxis.  1  . folic acid (FOLVITE) 1 MG tablet Take 1 tablet (1 mg total) by mouth daily. (Patient taking differently: Take 1 mg by mouth at bedtime. ) 90 tablet 3  . levothyroxine (SYNTHROID, LEVOTHROID) 112 MCG tablet TAKE 1 TABLET (112 MCG TOTAL) BY MOUTH DAILY BEFORE BREAKFAST. NEEDS COMPLETE PHYSICAL EXAM 90 tablet 3  . methylcellulose (CITRUCEL) oral powder Take 1 packet by mouth daily as needed (constipation).     . Multiple Vitamin (MULTIVITAMIN WITH MINERALS) TABS tablet Take 1 tablet by mouth at bedtime.    Marland Kitchen omeprazole (PRILOSEC) 20 MG capsule Take 1 capsule (20 mg total) by mouth daily. (Patient taking differently: Take 20 mg by mouth at bedtime. ) 90 capsule 3  . prochlorperazine (COMPAZINE) 5 MG tablet Take 1 tablet (5 mg total) by mouth every 6 (six) hours as needed for nausea or vomiting. 30 tablet 0   No current facility-administered medications on file prior to visit.      Review of Systems  Constitutional: Negative for activity change, appetite change, fatigue, fever and unexpected weight change.  HENT: Negative for congestion, ear pain, rhinorrhea,  sinus pressure and sore throat.   Eyes: Negative for pain, redness and visual disturbance.  Respiratory: Negative for cough, shortness of breath and wheezing.   Cardiovascular: Negative for chest pain and palpitations.  Gastrointestinal: Negative for abdominal pain, blood in stool, constipation and diarrhea.  Endocrine: Negative for polydipsia and polyuria.  Genitourinary: Negative for dysuria, flank pain, frequency, hematuria and urgency.       Pain in L groin w/o tenderness  Musculoskeletal: Positive for back pain. Negative for arthralgias and myalgias.  Skin: Negative for pallor and rash.  Allergic/Immunologic: Negative for environmental allergies.  Neurological: Negative for dizziness,  syncope and headaches.  Hematological: Negative for adenopathy. Does not bruise/bleed easily.  Psychiatric/Behavioral: Negative for decreased concentration and dysphoric mood. The patient is not nervous/anxious.        Objective:   Physical Exam Constitutional:      General: She is not in acute distress.    Appearance: Normal appearance. She is well-developed. She is obese. She is not ill-appearing.  HENT:     Head: Normocephalic and atraumatic.     Mouth/Throat:     Mouth: Mucous membranes are moist.  Eyes:     General: No scleral icterus.    Conjunctiva/sclera: Conjunctivae normal.     Pupils: Pupils are equal, round, and reactive to light.  Neck:     Musculoskeletal: Normal range of motion and neck supple. No muscular tenderness.     Thyroid: No thyromegaly.     Vascular: No carotid bruit or JVD.  Cardiovascular:     Rate and Rhythm: Normal rate and regular rhythm.     Heart sounds: Normal heart sounds. No gallop.   Pulmonary:     Effort: Pulmonary effort is normal. No respiratory distress.     Breath sounds: Normal breath sounds. No wheezing or rales.  Abdominal:     General: Bowel sounds are normal. There is no distension or abdominal bruit.     Palpations: Abdomen is soft. There is  no mass.     Tenderness: There is no abdominal tenderness. There is no guarding or rebound.     Hernia: No hernia is present.     Comments: Unable to detect a hernia  No reprod of pain with valsalva abd scars noted  No tenderness   Musculoskeletal:     Right lower leg: No edema.     Left lower leg: No edema.  Lymphadenopathy:     Cervical: No cervical adenopathy.  Skin:    General: Skin is warm and dry.     Findings: No erythema, lesion or rash.  Neurological:     Mental Status: She is alert.     Coordination: Coordination normal.     Gait: Gait normal.     Deep Tendon Reflexes: Reflexes are normal and symmetric.  Psychiatric:        Mood and Affect: Mood normal.           Assessment & Plan:   Problem List Items Addressed This Visit      Other   Iron deficiency anemia - Primary   Relevant Orders   CBC with Differential/Platelet (Completed)   Groin pain, left    Intermittent (fleeting) sometimes intense L groin pain in pt with complex hx of bariatric surgery with complications / and morbid obesity (making exam challenging) Nl hip exam  ua with wbc-sent for cx  Cmet/cbc Suspect she may need a CT to r/o hernia  No s/s of bowel obst inst to call if symptoms suddenly worsen or change  Further plan to follow       Relevant Orders   Comprehensive metabolic panel (Completed)   CBC with Differential/Platelet (Completed)   POCT Urinalysis Dipstick (Automated) (Completed)   Urine Culture    Other Visit Diagnoses    Abnormal urinalysis       Relevant Orders   Urine Culture

## 2019-07-20 ENCOUNTER — Encounter: Payer: Self-pay | Admitting: Family Medicine

## 2019-07-20 ENCOUNTER — Telehealth: Payer: Self-pay | Admitting: Family Medicine

## 2019-07-20 DIAGNOSIS — R1032 Left lower quadrant pain: Secondary | ICD-10-CM

## 2019-07-20 LAB — URINE CULTURE
MICRO NUMBER:: 711366
SPECIMEN QUALITY:: ADEQUATE

## 2019-07-20 NOTE — Telephone Encounter (Signed)
Referral done Will route to [facility]  

## 2019-07-20 NOTE — Telephone Encounter (Signed)
Please change CT order to a CT Abdomen/Pelvis with contrast. They only do with and without for known pancreatic,liver,renal/bladder masses. Please place New CT order.

## 2019-07-20 NOTE — Telephone Encounter (Signed)
-----   Message from Tammi Sou, [place] sent at 07/20/2019 11:58 AM EDT ----- Pt viewed results on mychart and responded requesting to proceed with CT, please see mychart message

## 2019-07-24 ENCOUNTER — Ambulatory Visit (HOSPITAL_COMMUNITY)
Admission: RE | Admit: 2019-07-24 | Discharge: 2019-07-24 | Disposition: A | Discharge status: Home / Self Care | Payer: Medicare Other | Source: Ambulatory Visit | Attending: Family Medicine | Admitting: Family Medicine

## 2019-07-24 ENCOUNTER — Encounter (HOSPITAL_COMMUNITY): Payer: Self-pay

## 2019-07-24 ENCOUNTER — Other Ambulatory Visit: Payer: Self-pay

## 2019-07-24 DIAGNOSIS — R1032 Left lower quadrant pain: Secondary | ICD-10-CM | POA: Diagnosis not present

## 2019-07-24 MED ORDER — IOHEXOL 300 MG/ML  SOLN
100.0000 mL | Freq: Once | INTRAMUSCULAR | Status: AC | PRN
  Administered 2019-07-24: 100 mL via INTRAVENOUS

## 2019-07-24 MED ORDER — SODIUM CHLORIDE (PF) 0.9 % IJ SOLN
INTRAMUSCULAR | Status: AC
  Filled 2019-07-24: qty 50

## 2019-08-02 MED ORDER — CYCLOBENZAPRINE HCL 10 MG PO TABS: 20.0000 mg | ORAL_TABLET | Freq: Every day | ORAL | 5 refills | Status: DC

## 2019-08-02 NOTE — Telephone Encounter (Signed)
Not controlled so I gave refills She should try to cut down to 1 at bedtime periodically or wean as able

## 2019-08-08 NOTE — Assessment & Plan Note (Signed)
Iron deficiency anemia also due to gastric bypass surgery: IV iron given July 2019, surprisingly iron levels are stable and does not require IV iron therapy. 

## 2019-08-08 NOTE — Assessment & Plan Note (Signed)
Due to gastric bypass surgery.  B12 levels May 2019: 98 B12 level October 2019: 68 B12 01/18/2019: 130 B12: 03/15/2019: 359  Treatment summary: B12 weekly x4 started 09/21/2018, then every 2 weeks x4, now monthly Folic acid deficiency: Over-the-counter folic acid supplementation  Prior history of 3 episodes of DVT: Took anticoagulation 1996 2006, has an IVC filter Return to clinic monthly for B12 injections and every 6 months for follow-up with me.

## 2019-08-14 NOTE — Progress Notes (Signed)
Patient Care Team: [name], MD as PCP - General Angelena Form Annita Brod, MD as PCP - Cardiology (Cardiology)  DIAGNOSIS:    ICD-10-CM   1. Other iron deficiency anemia  D50.8 Ferritin    Vitamin B12    Iron and TIBC    CBC with Differential (Cancer Center Only)  2. Vitamin B 12 deficiency  E53.8 Ferritin    Vitamin B12    Iron and TIBC    CBC with Differential (Cancer Center Only)   CHIEF COMPLIANT: Follow-up of iron deficiency and B-12 deficiency  INTERVAL HISTORY: [name] is a 58 y.o. with above-mentioned history of gastric bypass surgery followed by iron deficiency and B12 deficiency. She is currently onweeklyB12 injections. She presents to the clinic alone today for follow-up to review her labs.  She reports to be feeling quite well.  The schools are started so she is staying very busy with classes.  REVIEW OF SYSTEMS:   Constitutional: Denies fevers, chills or abnormal weight loss Eyes: Denies blurriness of vision Ears, nose, mouth, throat, and face: Denies mucositis or sore throat Respiratory: Denies cough, dyspnea or wheezes Cardiovascular: Denies palpitation, chest discomfort Gastrointestinal: Denies nausea, heartburn or change in bowel habits Skin: Denies abnormal skin rashes Lymphatics: Denies new lymphadenopathy or easy bruising Neurological: Denies numbness, tingling or new weaknesses Behavioral/Psych: Mood is stable, no new changes  Extremities: No lower extremity edema Breast: denies any pain or lumps or nodules in either breasts All other systems were reviewed with the patient and are negative.  I have reviewed the past medical history, past surgical history, social history and family history with the patient and they are unchanged from previous note.  ALLERGIES:  is allergic to [employer] tartrate]; food; latex; ramelteon; and tetracyclines & related.  MEDICATIONS:  Current Outpatient Medications  Medication Sig Dispense Refill   . cyclobenzaprine (FLEXERIL) 10 MG tablet Take 2 tablets (20 mg total) by mouth at bedtime. 60 tablet 5  . diphenhydramine-acetaminophen (TYLENOL PM) 25-500 MG TABS tablet Take 2 tablets by mouth at bedtime.    Marland Kitchen EPINEPHrine 0.3 mg/0.3 mL IJ SOAJ injection Inject 0.3 mg into the muscle daily as needed for anaphylaxis.  1  . folic acid (FOLVITE) 1 MG tablet Take 1 tablet (1 mg total) by mouth daily. (Patient taking differently: Take 1 mg by mouth at bedtime. ) 90 tablet 3  . levothyroxine (SYNTHROID, LEVOTHROID) 112 MCG tablet TAKE 1 TABLET (112 MCG TOTAL) BY MOUTH DAILY BEFORE BREAKFAST. NEEDS COMPLETE PHYSICAL EXAM 90 tablet 3  . methylcellulose (CITRUCEL) oral powder Take 1 packet by mouth daily as needed (constipation).     . Multiple Vitamin (MULTIVITAMIN WITH MINERALS) TABS tablet Take 1 tablet by mouth at bedtime.    Marland Kitchen omeprazole (PRILOSEC) 20 MG capsule Take 1 capsule (20 mg total) by mouth daily. (Patient taking differently: Take 20 mg by mouth at bedtime. ) 90 capsule 3  . prochlorperazine (COMPAZINE) 5 MG tablet Take 1 tablet (5 mg total) by mouth every 6 (six) hours as needed for nausea or vomiting. 30 tablet 0   No current facility-administered medications for this visit.    Facility-Administered Medications Ordered in Other Visits  Medication Dose Route Frequency Provider Last Rate Last Dose  . cyanocobalamin ((VITAMIN B-12)) injection 1,000 mcg  1,000 mcg Intramuscular Once Nicholas Lose, MD        PHYSICAL EXAMINATION: ECOG PERFORMANCE STATUS: 1 - Symptomatic but completely ambulatory  Vitals:   08/15/19 1139  BP: Marland Kitchen)  135/93  Pulse: 67  Resp: 17  Temp: 98.3 F (36.8 C)  SpO2: 97%   Filed Weights   08/15/19 1139  Weight: (!) 336 lb 11.2 oz (152.7 kg)    GENERAL: alert, no distress and comfortable SKIN: skin color, texture, turgor are normal, no rashes or significant lesions EYES: normal, Conjunctiva are pink and non-injected, sclera clear OROPHARYNX: no exudate, no  erythema and lips, buccal mucosa, and tongue normal  NECK: supple, thyroid normal size, non-tender, without nodularity LYMPH: no palpable lymphadenopathy in the cervical, axillary or inguinal LUNGS: clear to auscultation and percussion with normal breathing effort HEART: regular rate & rhythm and no murmurs and no lower extremity edema ABDOMEN: abdomen soft, non-tender and normal bowel sounds MUSCULOSKELETAL: no cyanosis of digits and no clubbing  NEURO: alert & oriented x 3 with fluent speech, no focal motor/sensory deficits EXTREMITIES: No lower extremity edema  LABORATORY DATA:  I have reviewed the data as listed CMP Latest Ref Rng & Units 07/18/2019 04/23/2017 09/23/2016  Glucose 70 - 99 mg/dL 101(H) 100(H) 99  BUN 6 - 23 mg/dL 4(L) 6 <5(L)  Creatinine 0.40 - 1.20 mg/dL 0.50 0.56 0.55  Sodium 135 - 145 mEq/L 141 148(H) 141  Potassium 3.5 - 5.1 mEq/L 3.4(L) 4.2 3.6  Chloride 96 - 112 mEq/L 103 113(H) 107  CO2 19 - 32 mEq/L 32 29 25  Calcium 8.4 - 10.5 mg/dL 7.9(L) 9.0 8.5(L)  Total Protein 6.0 - 8.3 g/dL 6.4 7.1 -  Total Bilirubin 0.2 - 1.2 mg/dL 1.2 1.0 -  Alkaline Phos 39 - 117 U/L 144(H) 140(H) -  AST 0 - 37 U/L 14 14 -  ALT 0 - 35 U/L 13 11 -    Lab Results  Component Value Date   WBC 6.1 08/15/2019   HGB 13.5 08/15/2019   HCT 41.3 08/15/2019   MCV 97.2 08/15/2019   PLT 218 08/15/2019   NEUTROABS 3.0 08/15/2019    ASSESSMENT & PLAN:  Iron deficiency anemia Iron deficiency anemia also due to gastric bypass surgery: IV iron given July 2019, surprisingly iron levels are stable and does not require IV iron therapy.  Vitamin B 12 deficiency Due to gastric bypass surgery.  B12 levels May 2019: 98 B12 level October 2019: 68 B12 01/18/2019: 130 B12: 03/15/2019: 359  Treatment summary: B12 weekly x4 started 09/21/2018, then every 2 weeks x4, now monthly Folic acid deficiency: Over-the-counter folic acid supplementation  Prior history of 3 episodes of DVT: Took  anticoagulation 1996 2006, has an IVC filter Return to clinic monthly for B12 injections and every 6 months for follow-up with me.    Orders Placed This Encounter  Procedures  . Ferritin    Standing Status:   Future    Standing Expiration Date:   08/14/2020  . Vitamin B12    Standing Status:   Future    Standing Expiration Date:   08/14/2020  . Iron and TIBC    Standing Status:   Future    Standing Expiration Date:   08/14/2020  . CBC with Differential (Cancer Center Only)    Standing Status:   Future    Standing Expiration Date:   08/14/2020   The patient has a good understanding of the overall plan. she agrees with it. she will call with any problems that may develop before the next visit here.  Nicholas Lose, MD 08/15/2019  Julious Oka Dorshimer am acting as scribe for Dr. Nicholas Lose.  I have reviewed the above documentation  for accuracy and completeness, and I agree with the above.

## 2019-08-15 ENCOUNTER — Other Ambulatory Visit: Payer: Self-pay

## 2019-08-15 ENCOUNTER — Ambulatory Visit: Payer: Medicare Other

## 2019-08-15 ENCOUNTER — Inpatient Hospital Stay: Payer: Medicare Other

## 2019-08-15 ENCOUNTER — Inpatient Hospital Stay: Payer: Medicare Other | Attending: Hematology and Oncology | Admitting: Hematology and Oncology

## 2019-08-15 DIAGNOSIS — Z79899 Other long term (current) drug therapy: Secondary | ICD-10-CM | POA: Insufficient documentation

## 2019-08-15 DIAGNOSIS — E538 Deficiency of other specified B group vitamins: Secondary | ICD-10-CM | POA: Diagnosis not present

## 2019-08-15 DIAGNOSIS — Z888 Allergy status to other drugs, medicaments and biological substances status: Secondary | ICD-10-CM | POA: Insufficient documentation

## 2019-08-15 DIAGNOSIS — Z9884 Bariatric surgery status: Secondary | ICD-10-CM | POA: Diagnosis not present

## 2019-08-15 DIAGNOSIS — Z86718 Personal history of other venous thrombosis and embolism: Secondary | ICD-10-CM | POA: Insufficient documentation

## 2019-08-15 DIAGNOSIS — D508 Other iron deficiency anemias: Secondary | ICD-10-CM | POA: Diagnosis not present

## 2019-08-15 LAB — CBC WITH DIFFERENTIAL (CANCER CENTER ONLY)
Abs Immature Granulocytes: 0.02 10*3/uL (ref 0.00–0.07)
Basophils Absolute: 0.1 10*3/uL (ref 0.0–0.1)
Basophils Relative: 1 %
Eosinophils Absolute: 0.2 10*3/uL (ref 0.0–0.5)
Eosinophils Relative: 3 %
HCT: 41.3 % (ref 36.0–46.0)
Hemoglobin: 13.5 g/dL (ref 12.0–15.0)
Immature Granulocytes: 0 %
Lymphocytes Relative: 38 %
Lymphs Abs: 2.3 10*3/uL (ref 0.7–4.0)
MCH: 31.8 pg (ref 26.0–34.0)
MCHC: 32.7 g/dL (ref 30.0–36.0)
MCV: 97.2 fL (ref 80.0–100.0)
Monocytes Absolute: 0.5 10*3/uL (ref 0.1–1.0)
Monocytes Relative: 8 %
Neutro Abs: 3 10*3/uL (ref 1.7–7.7)
Neutrophils Relative %: 50 %
Platelet Count: 218 10*3/uL (ref 150–400)
RBC: 4.25 MIL/uL (ref 3.87–5.11)
RDW: 12.7 % (ref 11.5–15.5)
WBC Count: 6.1 10*3/uL (ref 4.0–10.5)
nRBC: 0 % (ref 0.0–0.2)

## 2019-08-15 LAB — IRON AND TIBC
Iron: 70 ug/dL (ref 41–142)
Saturation Ratios: 20 % — ABNORMAL LOW (ref 21–57)
TIBC: 350 ug/dL (ref 236–444)
UIBC: 280 ug/dL (ref 120–384)

## 2019-08-15 LAB — FERRITIN: Ferritin: 53 ng/mL (ref 11–307)

## 2019-08-15 LAB — VITAMIN B12: Vitamin B-12: 223 pg/mL (ref 180–914)

## 2019-08-15 LAB — TSH: TSH: 3.399 u[IU]/mL (ref 0.308–3.960)

## 2019-08-15 MED ORDER — CYANOCOBALAMIN 1000 MCG/ML IJ SOLN
1000.0000 ug | Freq: Once | INTRAMUSCULAR | Status: AC
  Administered 2019-08-15: 1000 ug via INTRAMUSCULAR

## 2019-08-15 MED ORDER — CYANOCOBALAMIN 1000 MCG/ML IJ SOLN
INTRAMUSCULAR | Status: AC
  Filled 2019-08-15: qty 1

## 2019-08-15 NOTE — Patient Instructions (Signed)

## 2019-08-16 ENCOUNTER — Telehealth: Payer: Self-pay | Admitting: Hematology and Oncology

## 2019-08-16 NOTE — Telephone Encounter (Signed)
I talk with patient regarding schedule  

## 2019-09-02 DIAGNOSIS — Z23 Encounter for immunization: Secondary | ICD-10-CM | POA: Diagnosis not present

## 2019-09-12 ENCOUNTER — Other Ambulatory Visit: Payer: Self-pay

## 2019-09-12 ENCOUNTER — Inpatient Hospital Stay: Payer: Medicare Other | Attending: Hematology and Oncology

## 2019-09-12 VITALS — BP 134/87 | HR 62 | Temp 98.2°F | Resp 18

## 2019-09-12 DIAGNOSIS — Z9884 Bariatric surgery status: Secondary | ICD-10-CM | POA: Insufficient documentation

## 2019-09-12 DIAGNOSIS — Z79899 Other long term (current) drug therapy: Secondary | ICD-10-CM | POA: Insufficient documentation

## 2019-09-12 DIAGNOSIS — D508 Other iron deficiency anemias: Secondary | ICD-10-CM | POA: Insufficient documentation

## 2019-09-12 DIAGNOSIS — E538 Deficiency of other specified B group vitamins: Secondary | ICD-10-CM | POA: Diagnosis not present

## 2019-09-12 MED ORDER — CYANOCOBALAMIN 1000 MCG/ML IJ SOLN
1000.0000 ug | Freq: Once | INTRAMUSCULAR | Status: AC
  Administered 2019-09-12: 1000 ug via INTRAMUSCULAR

## 2019-09-12 MED ORDER — CYANOCOBALAMIN 1000 MCG/ML IJ SOLN
INTRAMUSCULAR | Status: AC
  Filled 2019-09-12: qty 1

## 2019-09-12 NOTE — Patient Instructions (Signed)

## 2019-10-08 ENCOUNTER — Other Ambulatory Visit: Payer: Self-pay | Admitting: Hematology and Oncology

## 2019-10-10 ENCOUNTER — Inpatient Hospital Stay: Payer: Medicare Other | Attending: Hematology and Oncology

## 2019-10-10 ENCOUNTER — Other Ambulatory Visit: Payer: Self-pay

## 2019-10-10 VITALS — BP 140/85 | HR 64 | Temp 98.2°F | Resp 18

## 2019-10-10 DIAGNOSIS — D508 Other iron deficiency anemias: Secondary | ICD-10-CM | POA: Insufficient documentation

## 2019-10-10 DIAGNOSIS — Z79899 Other long term (current) drug therapy: Secondary | ICD-10-CM | POA: Insufficient documentation

## 2019-10-10 DIAGNOSIS — E538 Deficiency of other specified B group vitamins: Secondary | ICD-10-CM | POA: Insufficient documentation

## 2019-10-10 DIAGNOSIS — Z9884 Bariatric surgery status: Secondary | ICD-10-CM | POA: Diagnosis not present

## 2019-10-10 MED ORDER — CYANOCOBALAMIN 1000 MCG/ML IJ SOLN
1000.0000 ug | Freq: Once | INTRAMUSCULAR | Status: AC
  Administered 2019-10-10: 1000 ug via INTRAMUSCULAR

## 2019-10-10 MED ORDER — CYANOCOBALAMIN 1000 MCG/ML IJ SOLN
INTRAMUSCULAR | Status: AC
  Filled 2019-10-10: qty 1

## 2019-10-10 NOTE — Patient Instructions (Signed)

## 2019-10-17 ENCOUNTER — Other Ambulatory Visit: Payer: Self-pay | Admitting: Internal Medicine

## 2019-11-07 ENCOUNTER — Inpatient Hospital Stay: Payer: Medicare Other | Attending: Hematology and Oncology

## 2019-11-07 ENCOUNTER — Other Ambulatory Visit: Payer: Self-pay

## 2019-11-07 VITALS — BP 160/85 | HR 65 | Temp 98.5°F | Resp 18

## 2019-11-07 DIAGNOSIS — Z79899 Other long term (current) drug therapy: Secondary | ICD-10-CM | POA: Diagnosis not present

## 2019-11-07 DIAGNOSIS — E538 Deficiency of other specified B group vitamins: Secondary | ICD-10-CM

## 2019-11-07 DIAGNOSIS — D508 Other iron deficiency anemias: Secondary | ICD-10-CM | POA: Insufficient documentation

## 2019-11-07 DIAGNOSIS — Z9884 Bariatric surgery status: Secondary | ICD-10-CM | POA: Diagnosis not present

## 2019-11-07 MED ORDER — CYANOCOBALAMIN 1000 MCG/ML IJ SOLN
1000.0000 ug | Freq: Once | INTRAMUSCULAR | Status: AC
  Administered 2019-11-07: 1000 ug via INTRAMUSCULAR

## 2019-11-07 NOTE — Patient Instructions (Signed)

## 2019-12-05 ENCOUNTER — Other Ambulatory Visit: Payer: Self-pay

## 2019-12-05 ENCOUNTER — Inpatient Hospital Stay: Payer: Medicare Other | Attending: Hematology and Oncology

## 2019-12-05 VITALS — BP 139/99 | HR 56 | Temp 98.7°F | Resp 18

## 2019-12-05 DIAGNOSIS — D508 Other iron deficiency anemias: Secondary | ICD-10-CM | POA: Diagnosis present

## 2019-12-05 DIAGNOSIS — E538 Deficiency of other specified B group vitamins: Secondary | ICD-10-CM | POA: Diagnosis present

## 2019-12-05 DIAGNOSIS — Z79899 Other long term (current) drug therapy: Secondary | ICD-10-CM | POA: Insufficient documentation

## 2019-12-05 DIAGNOSIS — K912 Postsurgical malabsorption, not elsewhere classified: Secondary | ICD-10-CM | POA: Insufficient documentation

## 2019-12-05 DIAGNOSIS — Z9884 Bariatric surgery status: Secondary | ICD-10-CM | POA: Insufficient documentation

## 2019-12-05 MED ORDER — CYANOCOBALAMIN 1000 MCG/ML IJ SOLN
1000.0000 ug | Freq: Once | INTRAMUSCULAR | Status: AC
  Administered 2019-12-05: 1000 ug via INTRAMUSCULAR

## 2019-12-05 NOTE — Patient Instructions (Signed)
Cyanocobalamin, Pyridoxine, and Folate What is this medicine? A multivitamin containing folic acid, vitamin B6, and vitamin B12. This medicine may be used for other purposes; ask your health care provider or pharmacist if you have questions. COMMON BRAND NAME(S): AllanFol RX, AllanTex, Av-Vite FB, B Complex with Folic Acid, ComBgen, FaBB, Folamin, Folastin, Folbalin, Folbee, Folbic, Folcaps, Folgard, Folgard RX, Folgard RX 2.2, Folplex, Folplex 2.2, Foltabs 800, Foltx, Homocysteine Formula, Niva-Fol, NuFol, TL Gard RX, Virt-Gard, Virt-Vite, Virt-Vite Forte, Vita-Respa What should I tell my health care provider before I take this medicine? They need to know if you have any of these conditions:  bleeding or clotting disorder  history of anemia of any type  other chronic health condition  an unusual or allergic reaction to vitamins, other medicines, foods, dyes, or preservatives  pregnant or trying to get pregnant  breast-feeding How should I use this medicine? Take by mouth with a glass of water. May take with food. Follow the directions on the prescription label. It is usually given once a day. Do not take your medicine more often than directed. Contact your pediatrician regarding the use of this medicine in children. Special care may be needed. Overdosage: If you think you have taken too much of this medicine contact a poison control center or emergency room at once. NOTE: This medicine is only for you. Do not share this medicine with others. What if I miss a dose? If you miss a dose, take it as soon as you can. If it is almost time for your next dose, take only that dose. Do not take double or extra doses. What may interact with this medicine?  levodopa This list may not describe all possible interactions. Give your health care provider a list of all the medicines, herbs, non-prescription drugs, or dietary supplements you use. Also tell them if you smoke, drink alcohol, or use illegal  drugs. Some items may interact with your medicine. What should I watch for while using this medicine? See your health care professional for regular checks on your progress. Remember that vitamin supplements do not replace the need for good nutrition from a balanced diet. What side effects may I notice from receiving this medicine? Side effects that you should report to your doctor or health care professional as soon as possible:  allergic reaction such as skin rash or difficulty breathing  vomiting Side effects that usually do not require medical attention (report to your doctor or health care professional if they continue or are bothersome):  nausea  stomach upset This list may not describe all possible side effects. Call your doctor for medical advice about side effects. You may report side effects to FDA at 1-800-FDA-1088. Where should I keep my medicine? Keep out of the reach of children. Most vitamins should be stored at controlled room temperature. Check your specific product directions. Protect from heat and moisture. Throw away any unused medicine after the expiration date. NOTE: This sheet is a summary. It may not cover all possible information. If you have questions about this medicine, talk to your doctor, pharmacist, or health care provider.  2020 Elsevier/Gold Standard (2008-01-28 00:59:55)  

## 2019-12-27 ENCOUNTER — Ambulatory Visit (INDEPENDENT_AMBULATORY_CARE_PROVIDER_SITE_OTHER): Payer: Medicare Other | Admitting: Family Medicine

## 2019-12-27 ENCOUNTER — Other Ambulatory Visit: Payer: Self-pay

## 2019-12-27 ENCOUNTER — Encounter: Payer: Self-pay | Admitting: Family Medicine

## 2019-12-27 VITALS — BP 128/84 | HR 67 | Temp 96.9°F | Ht 63.75 in | Wt 340.1 lb

## 2019-12-27 DIAGNOSIS — N390 Urinary tract infection, site not specified: Secondary | ICD-10-CM | POA: Insufficient documentation

## 2019-12-27 DIAGNOSIS — N3000 Acute cystitis without hematuria: Secondary | ICD-10-CM

## 2019-12-27 DIAGNOSIS — R8290 Unspecified abnormal findings in urine: Secondary | ICD-10-CM | POA: Diagnosis not present

## 2019-12-27 LAB — POC URINALSYSI DIPSTICK (AUTOMATED)
Bilirubin, UA: NEGATIVE
Blood, UA: 80
Glucose, UA: NEGATIVE
Ketones, UA: NEGATIVE
Nitrite, UA: POSITIVE
Protein, UA: POSITIVE — AB
Spec Grav, UA: 1.02 (ref 1.010–1.025)
Urobilinogen, UA: 4 E.U./dL — AB
pH, UA: 6 (ref 5.0–8.0)

## 2019-12-27 MED ORDER — SULFAMETHOXAZOLE-TRIMETHOPRIM 800-160 MG PO TABS: 1.0000 | ORAL_TABLET | Freq: Two times a day (BID) | ORAL | 0 refills | Status: DC

## 2019-12-27 NOTE — Assessment & Plan Note (Signed)
Uncomplicated uti  Fluids enc/ water Bactrim ds bid 5 d Pending cx  Handout given  inst to call if symptoms worsen or fail to improve

## 2019-12-27 NOTE — Patient Instructions (Addendum)
Please try to drink more water  Watch for signs of dehydration   Take bactrim ds for uti  If suddenly worse or no improvement let me know  Get started on the antibiotic now   We will let you know when the culture comes back     Urinary Tract Infection, Adult  A urinary tract infection (UTI) is an infection of any part of the urinary tract. The urinary tract includes the kidneys, ureters, bladder, and urethra. These organs make, store, and get rid of urine in the body. Your health care provider may use other names to describe the infection. An upper UTI affects the ureters and kidneys (pyelonephritis). A lower UTI affects the bladder (cystitis) and urethra (urethritis). What are the causes? Most urinary tract infections are caused by bacteria in your genital area, around the entrance to your urinary tract (urethra). These bacteria grow and cause inflammation of your urinary tract. What increases the risk? You are more likely to develop this condition if:  You have a urinary catheter that stays in place (indwelling).  You are not able to control when you urinate or have a bowel movement (you have incontinence).  You are female and you: ? Use a spermicide or diaphragm for birth control. ? Have low estrogen levels. ? Are pregnant.  You have certain genes that increase your risk (genetics).  You are sexually active.  You take antibiotic medicines.  You have a condition that causes your flow of urine to slow down, such as: ? An enlarged prostate, if you are female. ? Blockage in your urethra (stricture). ? A kidney stone. ? A nerve condition that affects your bladder control (neurogenic bladder). ? Not getting enough to drink, or not urinating often.  You have certain medical conditions, such as: ? Diabetes. ? A weak disease-fighting system (immunesystem). ? Sickle cell disease. ? Gout. ? Spinal cord injury. What are the signs or symptoms? Symptoms of this condition  include:  Needing to urinate right away (urgently).  Frequent urination or passing small amounts of urine frequently.  Pain or burning with urination.  Blood in the urine.  Urine that smells bad or unusual.  Trouble urinating.  Cloudy urine.  Vaginal discharge, if you are female.  Pain in the abdomen or the lower back. You may also have:  Vomiting or a decreased appetite.  Confusion.  Irritability or tiredness.  A fever.  Diarrhea. The first symptom in older adults may be confusion. In some cases, they may not have any symptoms until the infection has worsened. How is this diagnosed? This condition is diagnosed based on your medical history and a physical exam. You may also have other tests, including:  Urine tests.  Blood tests.  Tests for sexually transmitted infections (STIs). If you have had more than one UTI, a cystoscopy or imaging studies may be done to determine the cause of the infections. How is this treated? Treatment for this condition includes:  Antibiotic medicine.  Over-the-counter medicines to treat discomfort.  Drinking enough water to stay hydrated. If you have frequent infections or have other conditions such as a kidney stone, you may need to see a health care provider who specializes in the urinary tract (urologist). In rare cases, urinary tract infections can cause sepsis. Sepsis is a life-threatening condition that occurs when the body responds to an infection. Sepsis is treated in the hospital with IV antibiotics, fluids, and other medicines. Follow these instructions at home:  Medicines  Take over-the-counter and  prescription medicines only as told by your health care provider.  If you were prescribed an antibiotic medicine, take it as told by your health care provider. Do not stop using the antibiotic even if you start to feel better. General instructions  Make sure you: ? Empty your bladder often and completely. Do not hold urine  for long periods of time. ? Empty your bladder after sex. ? Wipe from front to back after a bowel movement if you are female. Use each tissue one time when you wipe.  Drink enough fluid to keep your urine pale yellow.  Keep all follow-up visits as told by your health care provider. This is important. Contact a health care provider if:  Your symptoms do not get better after 1-2 days.  Your symptoms go away and then return. Get help right away if you have:  Severe pain in your back or your lower abdomen.  A fever.  Nausea or vomiting. Summary  A urinary tract infection (UTI) is an infection of any part of the urinary tract, which includes the kidneys, ureters, bladder, and urethra.  Most urinary tract infections are caused by bacteria in your genital area, around the entrance to your urinary tract (urethra).  Treatment for this condition often includes antibiotic medicines.  If you were prescribed an antibiotic medicine, take it as told by your health care provider. Do not stop using the antibiotic even if you start to feel better.  Keep all follow-up visits as told by your health care provider. This is important. This information is not intended to replace advice given to you by your health care provider. Make sure you discuss any questions you have with your health care provider. Document Revised: 11/24/2018 Document Reviewed: 06/16/2018 Elsevier Patient Education  2020 [employer].

## 2019-12-27 NOTE — Progress Notes (Signed)
Subjective:    Patient ID: [name], female    DOB: 04/29/1961, 59 y.o.   MRN: [id number]  This visit occurred during the SARS-CoV-2 public health emergency.  Safety protocols were in place, including screening questions prior to the visit, additional usage of staff PPE, and extensive cleaning of exam room while observing appropriate contact time as indicated for disinfecting solutions.    HPI Pt presents with urinary symptoms   59 yo pt of Dr Silvio Pate  Bad smelling urine - about 3 d and getting worse Now has urinary pressure  Some pain to urinate  No blood in urine but it looks cloudy   Was worried about dehydration-urine was darker  She drinks peach tea   Wt loss surgery limits drinking a lot at a time  Has to sip through the day   No fever  No nausea  listless  UA is positive  Results for orders placed or performed in visit on 12/27/19  POCT Urinalysis Dipstick (Automated)  Result Value Ref Range   Color, UA Amber    Clarity, UA Cloudy    Glucose, UA Negative Negative   Bilirubin, UA Negative    Ketones, UA Negative    Spec Grav, UA 1.020 1.010 - 1.025   Blood, UA 80 Ery/uL    pH, UA 6.0 5.0 - 8.0   Protein, UA Positive (A) Negative   Urobilinogen, UA 4.0 (A) 0.2 or 1.0 E.U./dL   Nitrite, UA Positive    Leukocytes, UA Large (3+) (A) Negative    Patient Active Problem List   Diagnosis Date Noted  . UTI (urinary tract infection) 12/27/2019  . Groin pain, left 07/18/2019  . Polyp of cecum   . Polyp of ascending colon   . Hemorrhoids   . Benign esophageal stricture   . Vitamin B 12 deficiency 03/15/2015  . Left thyroid nodule 07/31/2014  . Routine general medical examination at a health care facility 07/02/2011  . Lumbar pain with radiation down right leg 02/02/2011  . Arnold-Chiari malformation ([place]) 09/25/2009  . Chronic venous insufficiency 08/27/2009  . Obesity, morbid, BMI 50 or higher ([place]) 06/26/2008  . Sleep disturbance 03/27/2008  .  ALLERGIC RHINITIS 09/02/2007  . Hypothyroidism 02/18/2007  . Iron deficiency anemia 02/18/2007  . GERD 02/18/2007  . DVT, HX OF 02/18/2007  . COLONIC POLYPS 10/21/2005   Past Medical History:  Diagnosis Date  . Allergy   . Anemia   . Anxiety   . Chronic venous insufficiency   . Depression   . Family history of adverse reaction to anesthesia   . GERD (gastroesophageal reflux disease)   . Greenfield filter in place   . History of DVT (deep vein thrombosis)   . Hypothyroidism   . Thyroid cancer ([place]) dx'd 2008   surg only   Past Surgical History:  Procedure Laterality Date  . ABDOMINAL HYSTERECTOMY    . BALLOON DILATION N/A 12/08/2018   Procedure: BALLOON DILATION;  Surgeon: Milus Banister, MD;  Location: [facility];  Service: Endoscopy;  Laterality: N/A;  . BIOPSY THYROID  01/03   suggestive of Hashimoto's  . CESAREAN SECTION  1998  . CHOLECYSTECTOMY  1982  . COLONOSCOPY WITH PROPOFOL N/A 12/08/2018   Procedure: COLONOSCOPY WITH PROPOFOL;  Surgeon: Milus Banister, MD;  Location: [facility];  Service: Endoscopy;  Laterality: N/A;  . CRANIECTOMY SUBOCCIPITAL W/ CERVICAL LAMINECTOMY / CHIARI  05/2010   repair, Dr.Pool  . DILATION AND CURETTAGE OF UTERUS  2003  .  ESOPHAGOGASTRODUODENOSCOPY (EGD) WITH PROPOFOL N/A 12/08/2018   Procedure: ESOPHAGOGASTRODUODENOSCOPY (EGD) WITH PROPOFOL;  Surgeon: Milus Banister, MD;  Location: [facility];  Service: Endoscopy;  Laterality: N/A;  . FETAL SURGERY FOR CONGENITAL HERNIA    . GASTRIC BYPASS    . POLYPECTOMY  12/08/2018   Procedure: POLYPECTOMY;  Surgeon: Milus Banister, MD;  Location: [facility];  Service: Endoscopy;;  . THYROIDECTOMY  2008   left  . TONSILLECTOMY  1965  . TUBAL LIGATION  1998  . VEIN LIGATION  1995/1998   right leg   Social History   Tobacco Use  . Smoking status: Never Smoker  . Smokeless tobacco: Never Used  Substance Use Topics  . Alcohol use: Yes    Alcohol/week: 0.0 standard drinks     Comment: rare  . Drug use: No   Family History  Problem Relation Age of Onset  . Arthritis Mother   . Coronary artery disease Father   . Kidney cancer Father   . Lung cancer Father   . Coronary artery disease Sister   . Ovarian cancer Maternal Grandmother   . Thyroid disease Neg Hx   . Colon cancer Neg Hx   . Esophageal cancer Neg Hx   . Pancreatic cancer Neg Hx   . Stomach cancer Neg Hx    Allergies  Allergen Reactions  . Ambien [Zolpidem Tartrate]     [employer]  . Food Anaphylaxis, Diarrhea and Nausea And Vomiting    Bananas-anaphylactic Kiwi-anaphylactic Mushroom--nausea/vomiting Beer- anaphylactic  . Latex Other (See Comments)    REACTION: "anaphylactic shock"  . Ramelteon Other (See Comments)    REACTION: blacks out ([place])  . Tetracyclines & Related Anaphylaxis and Other (See Comments)    Syncope    Current Outpatient Medications on File Prior to Visit  Medication Sig Dispense Refill  . cyclobenzaprine (FLEXERIL) 10 MG tablet Take 2 tablets (20 mg total) by mouth at bedtime. 60 tablet 5  . diphenhydramine-acetaminophen (TYLENOL PM) 25-500 MG TABS tablet Take 2 tablets by mouth at bedtime.    Marland Kitchen EPINEPHrine 0.3 mg/0.3 mL IJ SOAJ injection Inject 0.3 mg into the muscle daily as needed for anaphylaxis.  1  . folic acid (FOLVITE) 1 MG tablet TAKE 1 TABLET BY MOUTH EVERY DAY 90 tablet 3  . levothyroxine (SYNTHROID) 112 MCG tablet TAKE 1 TABLET (112 MCG TOTAL) BY MOUTH DAILY BEFORE BREAKFAST. NEEDS COMPLETE PHYSICAL EXAM 90 tablet 3  . methylcellulose (CITRUCEL) oral powder Take 1 packet by mouth daily as needed (constipation).     . Multiple Vitamin (MULTIVITAMIN WITH MINERALS) TABS tablet Take 1 tablet by mouth at bedtime.    Marland Kitchen omeprazole (PRILOSEC) 20 MG capsule Take 1 capsule (20 mg total) by mouth daily. (Patient taking differently: Take 20 mg by mouth at bedtime. ) 90 capsule 3  . prochlorperazine (COMPAZINE) 5 MG tablet Take 1 tablet (5 mg total) by mouth every 6  (six) hours as needed for nausea or vomiting. 30 tablet 0   No current facility-administered medications on file prior to visit.     Review of Systems  Constitutional: Negative for activity change, appetite change, fatigue, fever and unexpected weight change.  HENT: Negative for congestion, ear pain, rhinorrhea, sinus pressure and sore throat.   Eyes: Negative for pain, redness and visual disturbance.  Respiratory: Negative for cough, shortness of breath and wheezing.   Cardiovascular: Negative for chest pain and palpitations.  Gastrointestinal: Negative for abdominal pain, blood in stool, constipation and diarrhea.  Endocrine:  Negative for polydipsia and polyuria.  Genitourinary: Positive for dysuria, frequency and urgency. Negative for hematuria, vaginal discharge and vaginal pain.  Musculoskeletal: Negative for arthralgias, back pain and myalgias.  Skin: Negative for pallor and rash.  Allergic/Immunologic: Negative for environmental allergies.  Neurological: Negative for dizziness, syncope and headaches.  Hematological: Negative for adenopathy. Does not bruise/bleed easily.  Psychiatric/Behavioral: Negative for decreased concentration and dysphoric mood. The patient is not nervous/anxious.        Objective:   Physical Exam Constitutional:      General: She is not in acute distress.    Appearance: Normal appearance. She is well-developed. She is obese. She is not ill-appearing.  HENT:     Head: Normocephalic and atraumatic.  Eyes:     Conjunctiva/sclera: Conjunctivae normal.     Pupils: Pupils are equal, round, and reactive to light.  Cardiovascular:     Rate and Rhythm: Normal rate and regular rhythm.     Heart sounds: Normal heart sounds.  Pulmonary:     Effort: Pulmonary effort is normal.     Breath sounds: Normal breath sounds.  Abdominal:     General: Bowel sounds are normal. There is no distension.     Palpations: Abdomen is soft.     Tenderness: There is abdominal  tenderness. There is no rebound.     Comments: No cva tenderness  Mild suprapubic tenderness  Musculoskeletal:     Cervical back: Normal range of motion and neck supple.  Lymphadenopathy:     Cervical: No cervical adenopathy.  Skin:    Findings: No rash.  Neurological:     Mental Status: She is alert.           Assessment & Plan:   Problem List Items Addressed This Visit      Genitourinary   UTI (urinary tract infection) - Primary    Uncomplicated uti  Fluids enc/ water Bactrim ds bid 5 d Pending cx  Handout given  inst to call if symptoms worsen or fail to improve      Relevant Medications   sulfamethoxazole-trimethoprim (BACTRIM DS) 800-160 MG tablet   Other Relevant Orders   Urine Culture    Other Visit Diagnoses    Abnormal urine odor       Relevant Orders   POCT Urinalysis Dipstick (Automated) (Completed)

## 2019-12-29 LAB — URINE CULTURE
MICRO NUMBER:: 10013341
SPECIMEN QUALITY:: ADEQUATE

## 2020-01-01 ENCOUNTER — Encounter: Payer: Self-pay | Admitting: Hematology and Oncology

## 2020-01-02 ENCOUNTER — Inpatient Hospital Stay: Payer: Medicare Other

## 2020-01-02 ENCOUNTER — Other Ambulatory Visit: Payer: Self-pay

## 2020-01-02 ENCOUNTER — Inpatient Hospital Stay: Payer: Medicare Other | Attending: Hematology and Oncology

## 2020-01-02 VITALS — BP 141/97 | HR 68 | Temp 98.3°F | Resp 18

## 2020-01-02 DIAGNOSIS — Z9884 Bariatric surgery status: Secondary | ICD-10-CM | POA: Insufficient documentation

## 2020-01-02 DIAGNOSIS — E538 Deficiency of other specified B group vitamins: Secondary | ICD-10-CM | POA: Diagnosis not present

## 2020-01-02 DIAGNOSIS — Z79899 Other long term (current) drug therapy: Secondary | ICD-10-CM | POA: Insufficient documentation

## 2020-01-02 DIAGNOSIS — D508 Other iron deficiency anemias: Secondary | ICD-10-CM | POA: Diagnosis not present

## 2020-01-02 MED ORDER — CYANOCOBALAMIN 1000 MCG/ML IJ SOLN
1000.0000 ug | Freq: Once | INTRAMUSCULAR | Status: AC
  Administered 2020-01-02: 15:00:00 1000 ug via INTRAMUSCULAR
  Filled 2020-01-02: qty 1

## 2020-01-05 ENCOUNTER — Other Ambulatory Visit: Payer: Self-pay

## 2020-01-05 ENCOUNTER — Ambulatory Visit (INDEPENDENT_AMBULATORY_CARE_PROVIDER_SITE_OTHER): Payer: Medicare Other | Admitting: Internal Medicine

## 2020-01-05 ENCOUNTER — Encounter: Payer: Self-pay | Admitting: Internal Medicine

## 2020-01-05 VITALS — BP 130/77 | HR 69 | Temp 98.0°F | Resp 16 | Ht 63.75 in | Wt 340.5 lb

## 2020-01-05 DIAGNOSIS — F39 Unspecified mood [affective] disorder: Secondary | ICD-10-CM | POA: Diagnosis not present

## 2020-01-05 DIAGNOSIS — R5383 Other fatigue: Secondary | ICD-10-CM | POA: Diagnosis not present

## 2020-01-05 LAB — COMPREHENSIVE METABOLIC PANEL
ALT: 14 U/L (ref 0–35)
AST: 16 U/L (ref 0–37)
Albumin: 4.3 g/dL (ref 3.5–5.2)
Alkaline Phosphatase: 155 U/L — ABNORMAL HIGH (ref 39–117)
BUN: 8 mg/dL (ref 6–23)
CO2: 28 mEq/L (ref 19–32)
Calcium: 7.6 mg/dL — ABNORMAL LOW (ref 8.4–10.5)
Chloride: 102 mEq/L (ref 96–112)
Creatinine, Ser: 0.75 mg/dL (ref 0.40–1.20)
GFR: 79.19 mL/min (ref >60.00)
Glucose, Bld: 86 mg/dL (ref 70–99)
Potassium: 3.5 mEq/L (ref 3.5–5.1)
Sodium: 139 mEq/L (ref 135–145)
Total Bilirubin: 1 mg/dL (ref 0.2–1.2)
Total Protein: 6.8 g/dL (ref 6.0–8.3)

## 2020-01-05 LAB — CBC
HCT: 41.7 % (ref 36.0–46.0)
Hemoglobin: 13.8 g/dL (ref 12.0–15.0)
MCHC: 33.1 g/dL (ref 30.0–36.0)
MCV: 97.2 fl (ref 78.0–100.0)
Platelets: 207 10*3/uL (ref 150.0–400.0)
RBC: 4.29 Mil/uL (ref 3.87–5.11)
RDW: 14.4 % (ref 11.5–15.5)
WBC: 8.3 10*3/uL (ref 4.0–10.5)

## 2020-01-05 LAB — SEDIMENTATION RATE: Sed Rate: 23 mm/hr (ref 0–30)

## 2020-01-05 LAB — T4, FREE: Free T4: 0.19 ng/dL — ABNORMAL LOW (ref 0.60–1.60)

## 2020-01-05 NOTE — Progress Notes (Signed)
Subjective:    Patient ID: [name], female    DOB: 05-03-1961, 59 y.o.   MRN: [id number]  HPI  Here due to fatigue  This visit occurred during the SARS-CoV-2 public health emergency.  Safety protocols were in place, including screening questions prior to the visit, additional usage of staff PPE, and extensive cleaning of exam room while observing appropriate contact time as indicated for disinfecting solutions.   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 cyclobenzaprine (FLEXERIL) 10 MG tablet Take 2 tablets (20 mg total) by mouth at bedtime. 60 tablet 5  . diphenhydramine-acetaminophen (TYLENOL PM) 25-500 MG TABS tablet Take 2 tablets by mouth at bedtime.    Marland Kitchen EPINEPHrine 0.3 mg/0.3 mL IJ SOAJ injection Inject 0.3 mg into the muscle daily as needed for anaphylaxis.  1  . folic acid (FOLVITE) 1 MG tablet TAKE 1 TABLET BY MOUTH EVERY DAY 90 tablet 3  . levothyroxine (SYNTHROID) 112 MCG tablet TAKE 1 TABLET (112 MCG TOTAL) BY MOUTH DAILY BEFORE BREAKFAST. NEEDS COMPLETE PHYSICAL EXAM 90 tablet 3  . methylcellulose (CITRUCEL) oral powder Take 1 packet by mouth daily as needed (constipation).     . Multiple Vitamin (MULTIVITAMIN WITH MINERALS) TABS tablet Take 1 tablet by mouth at bedtime.    Marland Kitchen omeprazole (PRILOSEC) 20 MG capsule Take 1 capsule (20 mg total) by mouth  daily. (Patient taking differently: Take 20 mg by mouth at bedtime. ) 90 capsule 3  . prochlorperazine (COMPAZINE) 5 MG tablet Take 1 tablet (5 mg total) by mouth every 6 (six) hours as needed for nausea or vomiting. 30 tablet 0   No current facility-administered medications on file prior to visit.    Allergies  Allergen Reactions  . Ambien [Zolpidem Tartrate]     [employer]  . Food Anaphylaxis, Diarrhea and Nausea And Vomiting    Bananas-anaphylactic Kiwi-anaphylactic Mushroom--nausea/vomiting Beer- anaphylactic  . Latex Other (See Comments)    REACTION: "anaphylactic shock"  . Ramelteon Other (See Comments)    REACTION: blacks out ([place])  . Tetracyclines & Related Anaphylaxis and Other (See Comments)    Syncope     Past Medical History:  Diagnosis Date  . Allergy   . Anemia   . Anxiety   . Chronic venous insufficiency   . Depression   . Family history of adverse reaction to anesthesia   . GERD (gastroesophageal reflux disease)   . Greenfield filter in place   . History of DVT (deep vein thrombosis)   . Hypothyroidism   . Thyroid cancer ([place]) dx'd 2008   surg only    Past Surgical History:  Procedure Laterality Date  . ABDOMINAL HYSTERECTOMY    . BALLOON DILATION N/A 12/08/2018   Procedure: BALLOON DILATION;  Surgeon: Milus Banister, MD;  Location: [facility];  Service: Endoscopy;  Laterality: N/A;  . BIOPSY THYROID  01/03  suggestive of Hashimoto's  . CESAREAN SECTION  1998  . CHOLECYSTECTOMY  1982  . COLONOSCOPY WITH PROPOFOL N/A 12/08/2018   Procedure: COLONOSCOPY WITH PROPOFOL;  Surgeon: Milus Banister, MD;  Location: [facility];  Service: Endoscopy;  Laterality: N/A;  . CRANIECTOMY SUBOCCIPITAL W/ CERVICAL LAMINECTOMY / CHIARI  05/2010   repair, Dr.Pool  . DILATION AND CURETTAGE OF UTERUS  2003  . ESOPHAGOGASTRODUODENOSCOPY (EGD) WITH PROPOFOL N/A 12/08/2018   Procedure: ESOPHAGOGASTRODUODENOSCOPY (EGD) WITH PROPOFOL;  Surgeon: Milus Banister,  MD;  Location: [facility];  Service: Endoscopy;  Laterality: N/A;  . FETAL SURGERY FOR CONGENITAL HERNIA    . GASTRIC BYPASS    . POLYPECTOMY  12/08/2018   Procedure: POLYPECTOMY;  Surgeon: Milus Banister, MD;  Location: [facility];  Service: Endoscopy;;  . THYROIDECTOMY  2008   left  . TONSILLECTOMY  1965  . TUBAL LIGATION  1998  . VEIN LIGATION  1995/1998   right leg    Family History  Problem Relation Age of Onset  . Arthritis Mother   . Coronary artery disease Father   . Kidney cancer Father   . Lung cancer Father   . Coronary artery disease Sister   . Ovarian cancer Maternal Grandmother   . Thyroid disease Neg Hx   . Colon cancer Neg Hx   . Esophageal cancer Neg Hx   . Pancreatic cancer Neg Hx   . Stomach cancer Neg Hx     Social History   Socioeconomic History  . Marital status: Divorced    Spouse name: Not on file  . Number of children: 3  . Years of education: Not on file  . Highest education level: Not on file  Occupational History  . Occupation: Part time Environmental manager  Tobacco Use  . Smoking status: Never Smoker  . Smokeless tobacco: Never Used  Substance and Sexual Activity  . Alcohol use: Yes    Alcohol/week: 0.0 standard drinks    Comment: rare  . Drug use: No  . Sexual activity: Not on file  Other Topics Concern  . Not on file  Social History Narrative   No living will   Requests mother to make health care decision   Would accept resuscitation attempts   Not sure about tube feeds         Social Determinants of Health   Financial Resource Strain:   . Difficulty of Paying Living Expenses: Not on file  Food Insecurity:   . Worried About Charity fundraiser in the Last Year: Not on file  . Ran Out of Food in the Last Year: Not on file  Transportation Needs:   . Lack of Transportation (Medical): Not on file  . Lack of Transportation (Non-Medical): Not on file  Physical Activity:   . Days of Exercise per Week: Not on file  .  Minutes of Exercise per Session: Not on file  Stress:   . Feeling of Stress : Not on file  Social Connections:   . Frequency of Communication with Friends and Family: Not on file  . Frequency of Social Gatherings with Friends and Family: Not on file  . Attends Religious Services: Not on file  . Active Member of Clubs or Organizations: Not on file  . Attends Archivist Meetings: Not on file  . Marital Status: Not on file  Intimate Partner Violence:   . Fear of Current or Ex-Partner: Not on file  . Emotionally Abused: Not on file  .  Physically Abused: Not on file  . Sexually Abused: Not on file   Review of Systems No fever No cough Some SOB--she relates to weight Hasn't lost weight No N/V Bowels are slow--no blood though Still gets weekly B12 shots    Objective:   Physical Exam         Assessment & Plan:

## 2020-01-05 NOTE — Assessment & Plan Note (Signed)
Vague and seems to be related to stress No worrisome history or PE findings Will go ahead with blood work

## 2020-01-05 NOTE — Assessment & Plan Note (Addendum)
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

## 2020-01-07 ENCOUNTER — Other Ambulatory Visit: Payer: Self-pay | Admitting: Internal Medicine

## 2020-01-07 DIAGNOSIS — E039 Hypothyroidism, unspecified: Secondary | ICD-10-CM

## 2020-01-23 ENCOUNTER — Other Ambulatory Visit: Payer: Self-pay

## 2020-01-23 ENCOUNTER — Other Ambulatory Visit: Payer: Self-pay | Admitting: Internal Medicine

## 2020-01-23 ENCOUNTER — Other Ambulatory Visit (INDEPENDENT_AMBULATORY_CARE_PROVIDER_SITE_OTHER): Payer: Medicare Other

## 2020-01-23 DIAGNOSIS — E039 Hypothyroidism, unspecified: Secondary | ICD-10-CM

## 2020-01-23 LAB — T4, FREE: Free T4: 0.33 ng/dL — ABNORMAL LOW (ref 0.60–1.60)

## 2020-01-23 LAB — TSH: TSH: 80.87 u[IU]/mL — ABNORMAL HIGH (ref 0.35–4.50)

## 2020-01-24 ENCOUNTER — Other Ambulatory Visit: Payer: Self-pay

## 2020-01-24 MED ORDER — LEVOTHYROXINE SODIUM 150 MCG PO TABS: 150.0000 ug | ORAL_TABLET | Freq: Every day | ORAL | 0 refills | Status: DC

## 2020-01-25 DIAGNOSIS — E039 Hypothyroidism, unspecified: Secondary | ICD-10-CM

## 2020-01-29 ENCOUNTER — Other Ambulatory Visit: Payer: Self-pay

## 2020-01-29 ENCOUNTER — Ambulatory Visit (INDEPENDENT_AMBULATORY_CARE_PROVIDER_SITE_OTHER): Payer: Medicare Other | Admitting: Cardiovascular Disease

## 2020-01-29 ENCOUNTER — Encounter: Payer: Self-pay | Admitting: Cardiovascular Disease

## 2020-01-29 VITALS — BP 122/86 | HR 66 | Ht 69.0 in | Wt 347.0 lb

## 2020-01-29 DIAGNOSIS — R002 Palpitations: Secondary | ICD-10-CM | POA: Diagnosis not present

## 2020-01-29 DIAGNOSIS — I5032 Chronic diastolic (congestive) heart failure: Secondary | ICD-10-CM

## 2020-01-29 NOTE — Patient Instructions (Signed)

## 2020-01-29 NOTE — Addendum Note (Signed)
Addended by: Mendel Ryder on: 01/29/2020 10:38 AM   Modules accepted: Orders

## 2020-01-29 NOTE — Progress Notes (Signed)
Chief Complaint  Patient presents with  . Follow-up   History of Present Illness: 59 yo female with history of DVT, chronic venous insufficiency and hypothyroidism who is here today for follow up. 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 She had been on coumadin but this was stopped in 2008 by Dr. Albertine Patricia because she was non-compliant and there was no evidence of hypercoagulable state. She has had a negative workup in the hematology clinic. She has also been seen in the past by Dr. Eilleen Kempf in [facility] for vein stripping but she did not have that procedure here in [place]. She had prior vein stripping in [place]. Echo March 2019 with LVEF=55-60%, no valve disease.  LV size and function and no significant valvular disease. Venous dopplers April 2018 with no DVT. She has an IVC filter in place. 21 day event monitor December 2013 did not show any evidence of PACs, PVCs, SVT or atrial fibrillation. She has been having issues with her thyroid and has follow up planned with Dr. Loanne Drilling.   She is here today for follow up. The patient denies any chest pain, dyspnea, palpitations, lower extremity edema, orthopnea, PND, dizziness, near syncope or syncope.   Primary Care Physician: [name], MD  Past Medical History:  Diagnosis Date  . Allergy   . Anemia   . Anxiety   . Chronic venous insufficiency   . Depression   . Family history of adverse reaction to anesthesia   . GERD (gastroesophageal reflux disease)   . [place] filter in place   . History of DVT (deep vein thrombosis)   . Hypothyroidism   . Thyroid cancer ([place]) dx'd 2008   surg only    Past Surgical History:  Procedure Laterality Date  . ABDOMINAL HYSTERECTOMY    . BALLOON DILATION N/A 12/08/2018   Procedure: BALLOON DILATION;  Surgeon: Milus Banister, MD;  Location: [facility];  Service: Endoscopy;   Laterality: N/A;  . BIOPSY THYROID  01/03   suggestive of Hashimoto's  . CESAREAN SECTION  1998  . CHOLECYSTECTOMY  1982  . COLONOSCOPY WITH PROPOFOL N/A 12/08/2018   Procedure: COLONOSCOPY WITH PROPOFOL;  Surgeon: Milus Banister, MD;  Location: [facility];  Service: Endoscopy;  Laterality: N/A;  . CRANIECTOMY SUBOCCIPITAL W/ CERVICAL LAMINECTOMY / CHIARI  05/2010   repair, Dr.Pool  . DILATION AND CURETTAGE OF UTERUS  2003  . ESOPHAGOGASTRODUODENOSCOPY (EGD) WITH PROPOFOL N/A 12/08/2018   Procedure: ESOPHAGOGASTRODUODENOSCOPY (EGD) WITH PROPOFOL;  Surgeon: Milus Banister, MD;  Location: [facility];  Service: Endoscopy;  Laterality: N/A;  . FETAL SURGERY FOR CONGENITAL HERNIA    . GASTRIC BYPASS    . POLYPECTOMY  12/08/2018   Procedure: POLYPECTOMY;  Surgeon: Milus Banister, MD;  Location: [facility];  Service: Endoscopy;;  . THYROIDECTOMY  2008   left  . TONSILLECTOMY  1965  . TUBAL LIGATION  1998  . VEIN LIGATION  1995/1998   right leg    Current Outpatient Medications  Medication Sig Dispense Refill  . cyclobenzaprine (FLEXERIL) 10 MG tablet Take 2 tablets (20 mg total) by mouth at bedtime. 60 tablet 5  . diphenhydramine-acetaminophen (TYLENOL PM) 25-500 MG TABS tablet Take 2 tablets by mouth at bedtime.    Marland Kitchen EPINEPHrine 0.3 mg/0.3 mL IJ SOAJ injection Inject 0.3 mg into the muscle daily as needed for anaphylaxis.  1  .  folic acid (FOLVITE) 1 MG tablet TAKE 1 TABLET BY MOUTH EVERY DAY 90 tablet 3  . levothyroxine (SYNTHROID) 300 MCG tablet Take 300 mcg by mouth daily before breakfast.    . methylcellulose (CITRUCEL) oral powder Take 1 packet by mouth daily as needed (constipation).     . Multiple Vitamin (MULTIVITAMIN WITH MINERALS) TABS tablet Take 1 tablet by mouth at bedtime.    Marland Kitchen omeprazole (PRILOSEC) 20 MG capsule Take 1 capsule (20 mg total) by mouth daily. (Patient taking differently: Take 20 mg by mouth at bedtime. ) 90 capsule 3  . prochlorperazine (COMPAZINE)  5 MG tablet Take 1 tablet (5 mg total) by mouth every 6 (six) hours as needed for nausea or vomiting. 30 tablet 0   No current facility-administered medications for this visit.    Allergies  Allergen Reactions  . Ambien [Zolpidem Tartrate]     [employer]  . Food Anaphylaxis, Diarrhea and Nausea And Vomiting    Bananas-anaphylactic Kiwi-anaphylactic Mushroom--nausea/vomiting Beer- anaphylactic  . Latex Other (See Comments)    REACTION: "anaphylactic shock"  . Ramelteon Other (See Comments)    REACTION: blacks out ([place])  . Tetracyclines & Related Anaphylaxis and Other (See Comments)    Syncope     Social History   Socioeconomic History  . Marital status: Divorced    Spouse name: Not on file  . Number of children: 3  . Years of education: Not on file  . Highest education level: Not on file  Occupational History  . Occupation: Part time Environmental manager  Tobacco Use  . Smoking status: Never Smoker  . Smokeless tobacco: Never Used  Substance and Sexual Activity  . Alcohol use: Yes    Alcohol/week: 0.0 standard drinks    Comment: rare  . Drug use: No  . Sexual activity: Not on file  Other Topics Concern  . Not on file  Social History Narrative   No living will   Requests mother to make health care decision   Would accept resuscitation attempts   Not sure about tube feeds         Social Determinants of Health   Financial Resource Strain:   . Difficulty of Paying Living Expenses: Not on file  Food Insecurity:   . Worried About Charity fundraiser in the Last Year: Not on file  . Ran Out of Food in the Last Year: Not on file  Transportation Needs:   . Lack of Transportation (Medical): Not on file  . Lack of Transportation (Non-Medical): Not on file  Physical Activity:   . Days of Exercise per Week: Not on file  . Minutes of Exercise per Session: Not on file  Stress:   . Feeling of Stress : Not on file  Social Connections:   . Frequency of Communication  with Friends and Family: Not on file  . Frequency of Social Gatherings with Friends and Family: Not on file  . Attends Religious Services: Not on file  . Active Member of Clubs or Organizations: Not on file  . Attends Archivist Meetings: Not on file  . Marital Status: Not on file  Intimate Partner Violence:   . Fear of Current or Ex-Partner: Not on file  . Emotionally Abused: Not on file  . Physically Abused: Not on file  . Sexually Abused: Not on file    Family History  Problem Relation Age of Onset  . Arthritis Mother   . Coronary artery disease Father   .  Kidney cancer Father   . Lung cancer Father   . Coronary artery disease Sister   . Ovarian cancer Maternal Grandmother   . Thyroid disease Neg Hx   . Colon cancer Neg Hx   . Esophageal cancer Neg Hx   . Pancreatic cancer Neg Hx   . Stomach cancer Neg Hx     Review of Systems:  As stated in the HPI and otherwise negative.   BP 122/86   Pulse 66   Ht 5\' 9"  (1.753 m)   Wt (!) 347 lb (157.4 kg)   SpO2 97%   BMI 51.24 kg/m   Physical Examination:  General: Well developed, well nourished, NAD  HEENT: OP clear, mucus membranes moist  SKIN: warm, dry. No rashes. Neuro: No focal deficits  Musculoskeletal: Muscle strength 5/5 all ext  Psychiatric: Mood and affect normal  Neck: No JVD, no carotid bruits, no thyromegaly, no lymphadenopathy.  Lungs:Clear bilaterally, no wheezes, rhonci, crackles Cardiovascular: Regular rate and rhythm. No murmurs, gallops or rubs. Abdomen:Soft. Bowel sounds present. Non-tender.  Extremities: No lower extremity edema. Pulses are 2 + in the bilateral DP/PT.  EKG:  EKG is  ordered today. The ekg ordered today demonstrates NSR, rate 66 bpm  Recent Labs: 01/05/2020: ALT 14; BUN 8; Creatinine, Ser 0.75; Hemoglobin 13.8; Platelets 207.0; Potassium 3.5; Sodium 139 01/23/2020: TSH 80.87   Lipid Panel    Component Value Date/Time   CHOL 244 (A) 05/03/2018 0000   TRIG 141 05/03/2018  0000   HDL 56 05/03/2018 0000   CHOLHDL 4 05/18/2014 0804   VLDL 14.0 05/18/2014 0804   LDLCALC 160 05/03/2018 0000     Wt Readings from Last 3 Encounters:  01/29/20 (!) 347 lb (157.4 kg)  01/05/20 (!) 340 lb 8 oz (154.4 kg)  12/27/19 (!) 340 lb 1 oz (154.3 kg)     Other studies Reviewed: Additional studies/ records that were reviewed today include: . Review of the above records demonstrates:    Assessment and Plan:   1. Palpitations: No recent palpitations. No arrythmias noted on 21 day event monitor in 2013..   2. Chronic diastolic CHF: Volume status appears to be stable. LV systolic function normal by echo in 2019.   3. LE edema/history of DVT: Venous dopplers April 2018 with no DVT. IVC filter in place since 2004. She has been off of coumadin for years. Followed in [facility]   4. Morbid obesity: We discussed weight loss   Current medicines are reviewed at length with the patient today.  The patient does not have concerns regarding medicines.  The following changes have been made:  no change  Labs/ tests ordered today include:   No orders of the defined types were placed in this encounter.   Disposition:   FU with me in 12  months  Signed, Lauree Chandler, MD 01/29/2020 10:25 AM    [facility] [address], [place], [place]  [address] Phone: [phone]; Fax: [phone]

## 2020-01-29 NOTE — Progress Notes (Signed)
Patient Care Team: [name], MD as PCP - General Angelena Form Annita Brod, MD as PCP - Cardiology (Cardiology)  DIAGNOSIS:    ICD-10-CM   1. Other iron deficiency anemia  D50.8     CHIEF COMPLIANT: Follow-up of iron deficiency and B-12 deficiency  INTERVAL HISTORY: [name] is a 59 y.o. with above-mentioned history of gastric bypass surgeryfollowed byiron deficiency and B12 deficiency. She is currently onweeklyB12 injections.She presents to the clinicalone today for follow-up  ALLERGIES:  is allergic to [employer] tartrate]; food; latex; ramelteon; and tetracyclines & related.  MEDICATIONS:  Current Outpatient Medications  Medication Sig Dispense Refill  . cyclobenzaprine (FLEXERIL) 10 MG tablet Take 2 tablets (20 mg total) by mouth at bedtime. 60 tablet 5  . diphenhydramine-acetaminophen (TYLENOL PM) 25-500 MG TABS tablet Take 2 tablets by mouth at bedtime.    Marland Kitchen EPINEPHrine 0.3 mg/0.3 mL IJ SOAJ injection Inject 0.3 mg into the muscle daily as needed for anaphylaxis.  1  . folic acid (FOLVITE) 1 MG tablet TAKE 1 TABLET BY MOUTH EVERY DAY 90 tablet 3  . levothyroxine (SYNTHROID) 300 MCG tablet Take 300 mcg by mouth daily before breakfast.    . methylcellulose (CITRUCEL) oral powder Take 1 packet by mouth daily as needed (constipation).     . Multiple Vitamin (MULTIVITAMIN WITH MINERALS) TABS tablet Take 1 tablet by mouth at bedtime.    Marland Kitchen omeprazole (PRILOSEC) 20 MG capsule Take 1 capsule (20 mg total) by mouth daily. (Patient taking differently: Take 20 mg by mouth at bedtime. ) 90 capsule 3  . prochlorperazine (COMPAZINE) 5 MG tablet Take 1 tablet (5 mg total) by mouth every 6 (six) hours as needed for nausea or vomiting. 30 tablet 0   No current facility-administered medications for this visit.    PHYSICAL EXAMINATION: ECOG PERFORMANCE STATUS: 1 - Symptomatic but completely ambulatory  Vitals:   01/30/20 1154  BP: 124/82  Pulse: 62  Resp: 17   Temp: 99.2 F (37.3 C)  SpO2: 98%   Filed Weights   01/30/20 1154  Weight: (!) 344 lb 8 oz (156.3 kg)    LABORATORY DATA:  I have reviewed the data as listed CMP Latest Ref Rng & Units 01/05/2020 07/18/2019 04/23/2017  Glucose 70 - 99 mg/dL 86 101(H) 100(H)  BUN 6 - 23 mg/dL 8 4(L) 6  Creatinine 0.40 - 1.20 mg/dL 0.75 0.50 0.56  Sodium 135 - 145 mEq/L 139 141 148(H)  Potassium 3.5 - 5.1 mEq/L 3.5 3.4(L) 4.2  Chloride 96 - 112 mEq/L 102 103 113(H)  CO2 19 - 32 mEq/L 28 32 29  Calcium 8.4 - 10.5 mg/dL 7.6(L) 7.9(L) 9.0  Total Protein 6.0 - 8.3 g/dL 6.8 6.4 7.1  Total Bilirubin 0.2 - 1.2 mg/dL 1.0 1.2 1.0  Alkaline Phos 39 - 117 U/L 155(H) 144(H) 140(H)  AST 0 - 37 U/L 16 14 14   ALT 0 - 35 U/L 14 13 11     Lab Results  Component Value Date   WBC 6.0 01/30/2020   HGB 12.9 01/30/2020   HCT 38.8 01/30/2020   MCV 99.0 01/30/2020   PLT 214 01/30/2020   NEUTROABS 2.9 01/30/2020    ASSESSMENT & PLAN:  Iron deficiency anemia Iron deficiency anemia also due to gastric bypass surgery: IV iron given July 2019, surprisingly iron levels are stable and does not require IV iron therapy.  Vitamin B 12 deficiency Due to gastric bypass surgery.  B12 levels May 2019: 98 B12 level October  2019: 68 B12 01/18/2019: 130 B12: 03/15/2019: 359  Treatment summary: B12 weekly x4 started 09/21/2018, then every 2 weeks x4, now monthly We will try to send a prescription and see if it gets filled so that she can do it at home  Severe Hypothyroidism: PCP working on adjusting her meds Folic acid deficiency: Over-the-counter folic acid supplementation  Prior history of 3 episodes of DVT: Took anticoagulation 1996 2006, has an IVC filter Return to clinic monthly for B12 injections and every 12 months for follow-up with me.    No orders of the defined types were placed in this encounter.  The patient has a good understanding of the overall plan. she agrees with it. she will call with any problems  that may develop before the next visit here.  Total time spent: 20 mins including face to face time and time spent for planning, charting and coordination of care  Nicholas Lose, MD 01/30/2020  I, Cloyde Reams Dorshimer, am acting as scribe for Dr. Nicholas Lose.  I have reviewed the above documentation for accuracy and completeness, and I agree with the above.

## 2020-01-30 ENCOUNTER — Other Ambulatory Visit: Payer: Self-pay

## 2020-01-30 ENCOUNTER — Inpatient Hospital Stay: Payer: Medicare Other

## 2020-01-30 ENCOUNTER — Inpatient Hospital Stay (HOSPITAL_BASED_OUTPATIENT_CLINIC_OR_DEPARTMENT_OTHER): Payer: Medicare Other | Admitting: Hematology and Oncology

## 2020-01-30 ENCOUNTER — Inpatient Hospital Stay: Payer: Medicare Other | Attending: Hematology and Oncology

## 2020-01-30 DIAGNOSIS — Z888 Allergy status to other drugs, medicaments and biological substances status: Secondary | ICD-10-CM | POA: Diagnosis not present

## 2020-01-30 DIAGNOSIS — Z9884 Bariatric surgery status: Secondary | ICD-10-CM | POA: Diagnosis not present

## 2020-01-30 DIAGNOSIS — D508 Other iron deficiency anemias: Secondary | ICD-10-CM

## 2020-01-30 DIAGNOSIS — Z79899 Other long term (current) drug therapy: Secondary | ICD-10-CM | POA: Insufficient documentation

## 2020-01-30 DIAGNOSIS — E538 Deficiency of other specified B group vitamins: Secondary | ICD-10-CM | POA: Diagnosis not present

## 2020-01-30 DIAGNOSIS — E039 Hypothyroidism, unspecified: Secondary | ICD-10-CM | POA: Diagnosis not present

## 2020-01-30 LAB — FERRITIN: Ferritin: 64 ng/mL (ref 11–307)

## 2020-01-30 LAB — CBC WITH DIFFERENTIAL (CANCER CENTER ONLY)
Abs Immature Granulocytes: 0.03 10*3/uL (ref 0.00–0.07)
Basophils Absolute: 0.1 10*3/uL (ref 0.0–0.1)
Basophils Relative: 1 %
Eosinophils Absolute: 0.3 10*3/uL (ref 0.0–0.5)
Eosinophils Relative: 4 %
HCT: 38.8 % (ref 36.0–46.0)
Hemoglobin: 12.9 g/dL (ref 12.0–15.0)
Immature Granulocytes: 1 %
Lymphocytes Relative: 40 %
Lymphs Abs: 2.4 10*3/uL (ref 0.7–4.0)
MCH: 32.9 pg (ref 26.0–34.0)
MCHC: 33.2 g/dL (ref 30.0–36.0)
MCV: 99 fL (ref 80.0–100.0)
Monocytes Absolute: 0.4 10*3/uL (ref 0.1–1.0)
Monocytes Relative: 7 %
Neutro Abs: 2.9 10*3/uL (ref 1.7–7.7)
Neutrophils Relative %: 47 %
Platelet Count: 214 10*3/uL (ref 150–400)
RBC: 3.92 MIL/uL (ref 3.87–5.11)
RDW: 14.2 % (ref 11.5–15.5)
WBC Count: 6 10*3/uL (ref 4.0–10.5)
nRBC: 0 % (ref 0.0–0.2)

## 2020-01-30 LAB — IRON AND TIBC
Iron: 86 ug/dL (ref 41–142)
Saturation Ratios: 24 % (ref 21–57)
TIBC: 359 ug/dL (ref 236–444)
UIBC: 273 ug/dL (ref 120–384)

## 2020-01-30 LAB — VITAMIN B12: Vitamin B-12: 223 pg/mL (ref 180–914)

## 2020-01-30 MED ORDER — CYANOCOBALAMIN 1000 MCG/ML IJ SOLN
1000.0000 ug | Freq: Once | INTRAMUSCULAR | Status: AC
  Administered 2020-01-30: 1000 ug via INTRAMUSCULAR

## 2020-01-30 MED ORDER — CYANOCOBALAMIN 1000 MCG/ML IJ SOLN: 1000.0000 ug | INTRAMUSCULAR | 3 refills | Status: DC

## 2020-01-30 MED ORDER — NEEDLES & SYRINGES MISC: 1.0000 | 3 refills | Status: AC

## 2020-01-30 MED ORDER — CYANOCOBALAMIN 1000 MCG/ML IJ SOLN
INTRAMUSCULAR | Status: AC
  Filled 2020-01-30: qty 1

## 2020-01-30 NOTE — Assessment & Plan Note (Signed)
Iron deficiency anemia also due to gastric bypass surgery: IV iron given July 2019, surprisingly iron levels are stable and does not require IV iron therapy.  Vitamin B 12 deficiency Due to gastric bypass surgery.  B12 levels May 2019: 98 B12 level October 2019: 68 B12 01/18/2019: 130 B12: 03/15/2019: 359  Treatment summary: B12 weekly x4 started 09/21/2018, then every 2 weeks x4, now monthly Folic acid deficiency: Over-the-counter folic acid supplementation  Prior history of 3 episodes of DVT: Took anticoagulation 1996 2006, has an IVC filter Return to clinic monthly for B12 injections and every 6 months for follow-up with me.

## 2020-01-31 ENCOUNTER — Telehealth: Payer: Self-pay | Admitting: Hematology and Oncology

## 2020-01-31 NOTE — Telephone Encounter (Signed)
I talk with patient regarding schedule  

## 2020-02-02 ENCOUNTER — Other Ambulatory Visit: Payer: Self-pay | Admitting: Internal Medicine

## 2020-02-03 NOTE — Telephone Encounter (Signed)
Last OV 01-05-20 No Future OV [facility]

## 2020-02-20 ENCOUNTER — Encounter: Payer: Self-pay | Admitting: Endocrinology

## 2020-02-20 ENCOUNTER — Other Ambulatory Visit: Payer: Self-pay

## 2020-02-20 ENCOUNTER — Ambulatory Visit (INDEPENDENT_AMBULATORY_CARE_PROVIDER_SITE_OTHER): Payer: Medicare Other | Admitting: Endocrinology

## 2020-02-20 VITALS — BP 118/72 | HR 88 | Ht 69.0 in | Wt 343.4 lb

## 2020-02-20 DIAGNOSIS — E039 Hypothyroidism, unspecified: Secondary | ICD-10-CM

## 2020-02-20 NOTE — Progress Notes (Signed)
Subjective:    Patient ID: [name], female    DOB: 1961/07/28, 59 y.o.   MRN: [id number]  HPI I last saw this pt in 2015.  In 2008, pt had diagnostic left thyroid lobect for a suspicious FNA; pathol showed chronic thyroiditis.  She has been on synthroid since then.  In 2020, she had sxs of severe fatigue and hair loss.  Synthroid was increased.  Fatigue improved, but hair loss persists.  She had gastric bypass in 2004.  Pt insists she was taking synthroid as rx'ed, as of the 01/23/20 TSH.  She has slight fullness of the ant neck.  She takes B-12 injections.   Past Medical History:  Diagnosis Date  . Allergy   . Anemia   . Anxiety   . Chronic venous insufficiency   . Depression   . Family history of adverse reaction to anesthesia   . GERD (gastroesophageal reflux disease)   . Greenfield filter in place   . History of DVT (deep vein thrombosis)   . Hypothyroidism   . Thyroid cancer ([place]) dx'd 2008   surg only    Past Surgical History:  Procedure Laterality Date  . ABDOMINAL HYSTERECTOMY    . BALLOON DILATION N/A 12/08/2018   Procedure: BALLOON DILATION;  Surgeon: Milus Banister, MD;  Location: [facility];  Service: Endoscopy;  Laterality: N/A;  . BIOPSY THYROID  01/03   suggestive of Hashimoto's  . CESAREAN SECTION  1998  . CHOLECYSTECTOMY  1982  . COLONOSCOPY WITH PROPOFOL N/A 12/08/2018   Procedure: COLONOSCOPY WITH PROPOFOL;  Surgeon: Milus Banister, MD;  Location: [facility];  Service: Endoscopy;  Laterality: N/A;  . CRANIECTOMY SUBOCCIPITAL W/ CERVICAL LAMINECTOMY / CHIARI  05/2010   repair, Dr.Pool  . DILATION AND CURETTAGE OF UTERUS  2003  . ESOPHAGOGASTRODUODENOSCOPY (EGD) WITH PROPOFOL N/A 12/08/2018   Procedure: ESOPHAGOGASTRODUODENOSCOPY (EGD) WITH PROPOFOL;  Surgeon: Milus Banister, MD;  Location: [facility];  Service: Endoscopy;  Laterality: N/A;  . FETAL SURGERY FOR CONGENITAL HERNIA    . GASTRIC BYPASS    . POLYPECTOMY  12/08/2018   Procedure: POLYPECTOMY;  Surgeon: Milus Banister, MD;  Location: [facility];  Service: Endoscopy;;  . THYROIDECTOMY  2008   left  . TONSILLECTOMY  1965  . TUBAL LIGATION  1998  . VEIN LIGATION  1995/1998   right leg    Social History   Socioeconomic History  . Marital status: Divorced    Spouse name: Not on file  . Number of children: 3  . Years of education: Not on file  . Highest education level: Not on file  Occupational History  . Occupation: Part time Environmental manager  Tobacco Use  . Smoking status: Never Smoker  . Smokeless tobacco: Never Used  Substance and Sexual Activity  . Alcohol use: Yes    Alcohol/week: 0.0 standard drinks    Comment: rare  . Drug use: No  . Sexual activity: Not on file  Other Topics Concern  . Not on file  Social History Narrative   No living will   Requests mother to make health care decision   Would accept resuscitation attempts   Not sure about tube feeds         Social Determinants of Health   Financial Resource Strain:   . Difficulty of Paying Living Expenses: Not on file  Food Insecurity:   . Worried About Charity fundraiser in the Last Year: Not on file  . Ran  Out of Food in the Last Year: Not on file  Transportation Needs:   . Lack of Transportation (Medical): Not on file  . Lack of Transportation (Non-Medical): Not on file  Physical Activity:   . Days of Exercise per Week: Not on file  . Minutes of Exercise per Session: Not on file  Stress:   . Feeling of Stress : Not on file  Social Connections:   . Frequency of Communication with Friends and Family: Not on file  . Frequency of Social Gatherings with Friends and Family: Not on file  . Attends Religious Services: Not on file  . Active Member of Clubs or Organizations: Not on file  . Attends Archivist Meetings: Not on file  . Marital Status: Not on file  Intimate Partner Violence:   . Fear of Current or Ex-Partner: Not on file  . Emotionally Abused:  Not on file  . Physically Abused: Not on file  . Sexually Abused: Not on file    Current Outpatient Medications on File Prior to Visit  Medication Sig Dispense Refill  . cyanocobalamin (,VITAMIN B-12,) 1000 MCG/ML injection Inject 1 mL (1,000 mcg total) into the muscle every 30 (thirty) days. 10 mL 3  . cyclobenzaprine (FLEXERIL) 10 MG tablet TAKE 2 TABLETS (20 MG TOTAL) BY MOUTH AT BEDTIME. 60 tablet 5  . diphenhydramine-acetaminophen (TYLENOL PM) 25-500 MG TABS tablet Take 2 tablets by mouth at bedtime.    Marland Kitchen EPINEPHrine 0.3 mg/0.3 mL IJ SOAJ injection Inject 0.3 mg into the muscle daily as needed for anaphylaxis.  1  . folic acid (FOLVITE) 1 MG tablet TAKE 1 TABLET BY MOUTH EVERY DAY 90 tablet 3  . methylcellulose (CITRUCEL) oral powder Take 1 packet by mouth daily as needed (constipation).     . Multiple Vitamin (MULTIVITAMIN WITH MINERALS) TABS tablet Take 1 tablet by mouth at bedtime.    . Needles & Syringes MISC 1 Syringe by Does not apply route every 30 (thirty) days. 12 Syringe 3  . omeprazole (PRILOSEC) 20 MG capsule Take 1 capsule (20 mg total) by mouth daily. (Patient taking differently: Take 20 mg by mouth at bedtime. ) 90 capsule 3  . prochlorperazine (COMPAZINE) 5 MG tablet Take 1 tablet (5 mg total) by mouth every 6 (six) hours as needed for nausea or vomiting. 30 tablet 0   No current facility-administered medications on file prior to visit.    Allergies  Allergen Reactions  . Ambien [Zolpidem Tartrate]     [employer]  . Food Anaphylaxis, Diarrhea and Nausea And Vomiting    Bananas-anaphylactic Kiwi-anaphylactic Mushroom--nausea/vomiting Beer- anaphylactic  . Latex Other (See Comments)    REACTION: "anaphylactic shock"  . Ramelteon Other (See Comments)    REACTION: blacks out ([place])  . Tetracyclines & Related Anaphylaxis and Other (See Comments)    Syncope     Family History  Problem Relation Age of Onset  . Arthritis Mother   . Coronary artery disease  Father   . Kidney cancer Father   . Lung cancer Father   . Coronary artery disease Sister   . Ovarian cancer Maternal Grandmother   . Thyroid disease Neg Hx   . Colon cancer Neg Hx   . Esophageal cancer Neg Hx   . Pancreatic cancer Neg Hx   . Stomach cancer Neg Hx     BP 118/72 (BP Location: Left Arm, Patient Position: Sitting, Cuff Size: Large)   Pulse 88   Ht 5\' 9"  (1.753 m)  Wt (!) 343 lb 6.4 oz (155.8 kg)   SpO2 97%   BMI 50.71 kg/m    Review of Systems denies depression, muscle cramps, sob, memory loss, constipation, and cold intolerance.  She has weight gain and dry skin. She has slight numbness of the feet.       Objective:   Physical Exam VS: see vs page GEN: no distress HEAD: head: no deformity eyes: no periorbital swelling, no proptosis external nose and ears are normal NECK: a healed scar is present.  I do not appreciate a nodule in the thyroid or elsewhere in the neck.   CHEST WALL: no deformity LUNGS: clear to auscultation CV: reg rate and rhythm, no murmur MUSCULOSKELETAL: muscle bulk and strength are grossly normal.  no obvious joint swelling.  gait is normal and steady EXTEMITIES: Trace bilat leg edema, and bilat vv's PULSES: no carotid bruit NEURO:  cn 2-12 grossly intact.   readily moves all 4's.  sensation is intact to touch on all 4's SKIN:  Normal texture and temperature.  No rash or suspicious lesion is visible.   NODES:  None palpable at the neck PSYCH: alert, well-oriented.  Does not appear anxious nor depressed.  Lab Results  Component Value Date   TSH 80.87 (H) 01/23/2020   I have reviewed outside records, and summarized: Pt was noted to have elevated TSH, and referred here.  Pt has also been seen by hematol.  absorption of B-12 and Fe was addressed.      Assessment & Plan:  Hypothyroidism: severe exacerbation Bariatric surg status: this may compromise levothyroxine absorption, and the consistently of the absportion Hypocalcemia: she  prob has secondary hyperparathyroidism--also due to gastric bypass.  Chronic thyroiditis: the remaining lobe has prob failed in the interim between the surgery. Neck fullness, new.  Patient Instructions  Let's recheck the ultrasound.  you will receive a phone call, about a day and time for an appointment.   Blood tests are requested for you today.  We'll let you know about the results.   Please come back for a follow-up appointment in 2 months.      Levothyroxine tablets What is this medicine? LEVOTHYROXINE (lee voe thye ROX een) is a thyroid hormone. This medicine can improve symptoms of thyroid deficiency such as slow speech, lack of energy, weight gain, hair loss, dry skin, and feeling cold. It also helps to treat goiter (an enlarged thyroid gland). It is also used to treat some kinds of thyroid cancer along with surgery and other medicines. This medicine may be used for other purposes; ask your health care provider or pharmacist if you have questions. COMMON BRAND NAME(S): Estre, Euthyrox, Levo-T, Levothroid, Levoxyl, Synthroid, Thyro-Tabs, Unithroid What should I tell my health care provider before I take this medicine? They need to know if you have any of these conditions:  Addison's disease or other adrenal gland problem  angina  bone problems  diabetes  dieting or on a weight loss program  fertility problems  heart disease  pituitary gland problem  take medicines that treat or prevent blood clots  an unusual or allergic reaction to levothyroxine, thyroid hormones, other medicines, foods, dyes, or preservatives  pregnant or trying to get pregnant  breast-feeding How should I use this medicine? Take this medicine by mouth with plenty of water. It is best to take on an empty stomach, at least 30 minutes to one hour before breakfast. Avoid taking antacids containing aluminum or magnesium, simethicone, bile acid sequestrants, calcium carbonate,  sodium polystyrene  sulfonate, ferrous sulfate, sevelamer, lanthanum, or sucralfate within 4 hours of taking this medicine. Follow the directions on the prescription label. Take at the same time each day. Do not take your medicine more often than directed. Contact your pediatrician regarding the use of this medicine in children. While this drug may be prescribed for children and infants as young as a few days of age for selected conditions, precautions do apply. For infants, you may crush the tablet and place in a small amount of (5 to 10 mL or 1 to 2 teaspoonfuls) of water, breast milk, or non-soy based infant formula. Do not mix with soy-based infant formula. Give as directed. Overdosage: If you think you have taken too much of this medicine contact a poison control center or emergency room at once. NOTE: This medicine is only for you. Do not share this medicine with others. What if I miss a dose? If you miss a dose, take it as soon as you can. If it is almost time for your next dose, take only that dose. Do not take double or extra doses. What may interact with this medicine?  amiodarone  antacids  anti-thyroid medicines  calcium supplements  carbamazepine  certain medicines for depression  certain medicines to treat cancer  cholestyramine  clofibrate  colesevelam  colestipol  digoxin  female hormones, like estrogens and birth control pills, patches, rings, or injections  iron supplements  ketamine  lanthanum  liquid nutrition products like Ensure  lithium  medicines for colds and breathing difficulties  medicines for diabetes  medicines or dietary supplements for weight loss  methadone  niacin  orlistat  oxandrolone  phenobarbital or other barbiturates  phenytoin  rifampin  sevelamer  simethicone  sodium polystyrene sulfonate  soy isoflavones  steroid medicines like prednisone or cortisone  sucralfate  testosterone  theophylline  warfarin This list may  not describe all possible interactions. Give your health care provider a list of all the medicines, herbs, non-prescription drugs, or dietary supplements you use. Also tell them if you smoke, drink alcohol, or use illegal drugs. Some items may interact with your medicine. What should I watch for while using this medicine? Be sure to take this medicine with plenty of fluids. Some tablets may cause choking, gagging, or difficulty swallowing from the tablet getting stuck in your throat. Most of these problems disappear if the medicine is taken with the right amount of water or other fluids. Do not switch brands of this medicine unless your health care professional agrees with the change. Ask questions if you are uncertain. You will need regular exams and occasional blood tests to check the response to treatment. If you are receiving this medicine for an underactive thyroid, it may be several weeks before you notice an improvement. Check with your doctor or health care professional if your symptoms do not improve. It may be necessary for you to take this medicine for the rest of your life. Do not stop using this medicine unless your doctor or health care professional advises you to. This medicine can affect blood sugar levels. If you have diabetes, check your blood sugar as directed. You may lose some of your hair when you first start treatment. With time, this usually corrects itself. If you are going to have surgery, tell your doctor or health care professional that you are taking this medicine. What side effects may I notice from receiving this medicine? Side effects that you should report to your doctor or  health care professional as soon as possible:  allergic reactions like skin rash, itching or hives, swelling of the face, lips, or tongue  anxious  breathing problems  changes in menstrual periods  chest pain  diarrhea  excessive sweating or intolerance to heat  fast or irregular  heartbeat  leg cramps  nervousness  swelling of ankles, feet, or legs  tremors  trouble sleeping  vomiting Side effects that usually do not require medical attention (report to your doctor or health care professional if they continue or are bothersome):  changes in appetite  headache  irritable  nausea  weight loss This list may not describe all possible side effects. Call your doctor for medical advice about side effects. You may report side effects to FDA at 1-800-FDA-1088. Where should I keep my medicine? Keep out of the reach of children. Store at room temperature between 15 and 30 degrees C (59 and 86 degrees F). Protect from light and moisture. Keep container tightly closed. Throw away any unused medicine after the expiration date. NOTE: This sheet is a summary. It may not cover all possible information. If you have questions about this medicine, talk to your doctor, pharmacist, or health care provider.  2020 Elsevier/Gold Standard (2019-07-14 15:09:06)

## 2020-02-20 NOTE — Patient Instructions (Addendum)
Let's recheck the ultrasound.  you will receive a phone call, about a day and time for an appointment.   Blood tests are requested for you today.  We'll let you know about the results.   Please come back for a follow-up appointment in 2 months.      Levothyroxine tablets What is this medicine? LEVOTHYROXINE (lee voe thye ROX een) is a thyroid hormone. This medicine can improve symptoms of thyroid deficiency such as slow speech, lack of energy, weight gain, hair loss, dry skin, and feeling cold. It also helps to treat goiter (an enlarged thyroid gland). It is also used to treat some kinds of thyroid cancer along with surgery and other medicines. This medicine may be used for other purposes; ask your health care provider or pharmacist if you have questions. COMMON BRAND NAME(S): Estre, Euthyrox, Levo-T, Levothroid, Levoxyl, Synthroid, Thyro-Tabs, Unithroid What should I tell my health care provider before I take this medicine? They need to know if you have any of these conditions:  Addison's disease or other adrenal gland problem  angina  bone problems  diabetes  dieting or on a weight loss program  fertility problems  heart disease  pituitary gland problem  take medicines that treat or prevent blood clots  an unusual or allergic reaction to levothyroxine, thyroid hormones, other medicines, foods, dyes, or preservatives  pregnant or trying to get pregnant  breast-feeding How should I use this medicine? Take this medicine by mouth with plenty of water. It is best to take on an empty stomach, at least 30 minutes to one hour before breakfast. Avoid taking antacids containing aluminum or magnesium, simethicone, bile acid sequestrants, calcium carbonate, sodium polystyrene sulfonate, ferrous sulfate, sevelamer, lanthanum, or sucralfate within 4 hours of taking this medicine. Follow the directions on the prescription label. Take at the same time each day. Do not take your medicine more  often than directed. Contact your pediatrician regarding the use of this medicine in children. While this drug may be prescribed for children and infants as young as a few days of age for selected conditions, precautions do apply. For infants, you may crush the tablet and place in a small amount of (5 to 10 mL or 1 to 2 teaspoonfuls) of water, breast milk, or non-soy based infant formula. Do not mix with soy-based infant formula. Give as directed. Overdosage: If you think you have taken too much of this medicine contact a poison control center or emergency room at once. NOTE: This medicine is only for you. Do not share this medicine with others. What if I miss a dose? If you miss a dose, take it as soon as you can. If it is almost time for your next dose, take only that dose. Do not take double or extra doses. What may interact with this medicine?  amiodarone  antacids  anti-thyroid medicines  calcium supplements  carbamazepine  certain medicines for depression  certain medicines to treat cancer  cholestyramine  clofibrate  colesevelam  colestipol  digoxin  female hormones, like estrogens and birth control pills, patches, rings, or injections  iron supplements  ketamine  lanthanum  liquid nutrition products like Ensure  lithium  medicines for colds and breathing difficulties  medicines for diabetes  medicines or dietary supplements for weight loss  methadone  niacin  orlistat  oxandrolone  phenobarbital or other barbiturates  phenytoin  rifampin  sevelamer  simethicone  sodium polystyrene sulfonate  soy isoflavones  steroid medicines like prednisone or cortisone  sucralfate  testosterone  theophylline  warfarin This list may not describe all possible interactions. Give your health care provider a list of all the medicines, herbs, non-prescription drugs, or dietary supplements you use. Also tell them if you smoke, drink alcohol, or use  illegal drugs. Some items may interact with your medicine. What should I watch for while using this medicine? Be sure to take this medicine with plenty of fluids. Some tablets may cause choking, gagging, or difficulty swallowing from the tablet getting stuck in your throat. Most of these problems disappear if the medicine is taken with the right amount of water or other fluids. Do not switch brands of this medicine unless your health care professional agrees with the change. Ask questions if you are uncertain. You will need regular exams and occasional blood tests to check the response to treatment. If you are receiving this medicine for an underactive thyroid, it may be several weeks before you notice an improvement. Check with your doctor or health care professional if your symptoms do not improve. It may be necessary for you to take this medicine for the rest of your life. Do not stop using this medicine unless your doctor or health care professional advises you to. This medicine can affect blood sugar levels. If you have diabetes, check your blood sugar as directed. You may lose some of your hair when you first start treatment. With time, this usually corrects itself. If you are going to have surgery, tell your doctor or health care professional that you are taking this medicine. What side effects may I notice from receiving this medicine? Side effects that you should report to your doctor or health care professional as soon as possible:  allergic reactions like skin rash, itching or hives, swelling of the face, lips, or tongue  anxious  breathing problems  changes in menstrual periods  chest pain  diarrhea  excessive sweating or intolerance to heat  fast or irregular heartbeat  leg cramps  nervousness  swelling of ankles, feet, or legs  tremors  trouble sleeping  vomiting Side effects that usually do not require medical attention (report to your doctor or health care  professional if they continue or are bothersome):  changes in appetite  headache  irritable  nausea  weight loss This list may not describe all possible side effects. Call your doctor for medical advice about side effects. You may report side effects to FDA at 1-800-FDA-1088. Where should I keep my medicine? Keep out of the reach of children. Store at room temperature between 15 and 30 degrees C (59 and 86 degrees F). Protect from light and moisture. Keep container tightly closed. Throw away any unused medicine after the expiration date. NOTE: This sheet is a summary. It may not cover all possible information. If you have questions about this medicine, talk to your doctor, pharmacist, or health care provider.  2020 Elsevier/Gold Standard (2019-07-14 15:09:06)

## 2020-02-21 LAB — T4, FREE: Free T4: 0.34 ng/dL — ABNORMAL LOW (ref 0.60–1.60)

## 2020-02-21 LAB — PTH, INTACT AND CALCIUM
Calcium: 7.8 mg/dL — ABNORMAL LOW (ref 8.7–10.2)
PTH: 182 pg/mL — ABNORMAL HIGH (ref 15–65)

## 2020-02-21 LAB — TSH: TSH: 58.03 u[IU]/mL — ABNORMAL HIGH (ref 0.35–4.50)

## 2020-02-21 LAB — VITAMIN D 25 HYDROXY (VIT D DEFICIENCY, FRACTURES): VITD: 12.25 ng/mL — ABNORMAL LOW (ref 30.00–100.00)

## 2020-02-21 MED ORDER — LEVOTHYROXINE SODIUM 200 MCG PO TABS: 200.0000 ug | ORAL_TABLET | Freq: Every day | ORAL | 5 refills | Status: DC

## 2020-02-21 MED ORDER — ERGOCALCIFEROL 1.25 MG (50000 UT) PO CAPS: 50000.0000 [IU] | ORAL_CAPSULE | ORAL | 1 refills | Status: AC

## 2020-02-22 DIAGNOSIS — Z9884 Bariatric surgery status: Secondary | ICD-10-CM | POA: Diagnosis not present

## 2020-02-22 DIAGNOSIS — Z6841 Body Mass Index (BMI) 40.0 and over, adult: Secondary | ICD-10-CM | POA: Diagnosis not present

## 2020-02-22 DIAGNOSIS — K219 Gastro-esophageal reflux disease without esophagitis: Secondary | ICD-10-CM | POA: Diagnosis not present

## 2020-02-22 DIAGNOSIS — Z86718 Personal history of other venous thrombosis and embolism: Secondary | ICD-10-CM | POA: Diagnosis not present

## 2020-02-26 ENCOUNTER — Encounter: Payer: Self-pay | Admitting: Hematology and Oncology

## 2020-02-27 ENCOUNTER — Inpatient Hospital Stay: Payer: Medicare Other

## 2020-02-28 ENCOUNTER — Other Ambulatory Visit: Payer: Medicare Other

## 2020-03-13 ENCOUNTER — Other Ambulatory Visit: Payer: Self-pay

## 2020-03-13 ENCOUNTER — Other Ambulatory Visit (INDEPENDENT_AMBULATORY_CARE_PROVIDER_SITE_OTHER): Payer: Medicare Other

## 2020-03-13 ENCOUNTER — Ambulatory Visit
Admission: RE | Admit: 2020-03-13 | Discharge: 2020-03-13 | Disposition: A | Discharge status: Home / Self Care | Payer: Medicare Other | Source: Ambulatory Visit | Attending: Endocrinology | Admitting: Endocrinology

## 2020-03-13 ENCOUNTER — Other Ambulatory Visit: Payer: Medicare Other

## 2020-03-13 ENCOUNTER — Other Ambulatory Visit: Payer: Self-pay | Admitting: Endocrinology

## 2020-03-13 DIAGNOSIS — E039 Hypothyroidism, unspecified: Secondary | ICD-10-CM

## 2020-03-13 DIAGNOSIS — E065 Other chronic thyroiditis: Secondary | ICD-10-CM | POA: Diagnosis not present

## 2020-03-13 LAB — T4, FREE: Free T4: 0.41 ng/dL — ABNORMAL LOW (ref 0.60–1.60)

## 2020-03-13 LAB — TSH: TSH: 65.58 u[IU]/mL — ABNORMAL HIGH (ref 0.35–4.50)

## 2020-03-13 MED ORDER — LEVOTHYROXINE SODIUM 112 MCG PO TABS: 224.0000 ug | ORAL_TABLET | Freq: Every day | ORAL | 3 refills | Status: DC

## 2020-03-13 NOTE — Progress Notes (Signed)
Her labs still indicate undertreatment. I will leave this to you to decide the next step

## 2020-03-29 ENCOUNTER — Ambulatory Visit: Payer: Medicare Other

## 2020-04-10 ENCOUNTER — Telehealth: Payer: Self-pay | Admitting: Gastroenterology

## 2020-04-10 NOTE — Telephone Encounter (Signed)
Pt reported that she is experiencing rectal bleeding.  She stated that she has hemorrhoids and is passing "bloody tissue like during her period."

## 2020-04-10 NOTE — Telephone Encounter (Signed)
The pt was last seen in 2019 for rectal bleeding.  She had colon and was advised she may need hemorrhoid banding. She has had no contact with the office since and has now  begun to have BRBPR with hemorrhoids. She was scheduled to see Nevin Bloodgood on 4/28 to discuss.

## 2020-04-17 ENCOUNTER — Ambulatory Visit (INDEPENDENT_AMBULATORY_CARE_PROVIDER_SITE_OTHER): Payer: Medicare Other | Admitting: Nurse Practitioner

## 2020-04-17 ENCOUNTER — Encounter: Payer: Self-pay | Admitting: Nurse Practitioner

## 2020-04-17 VITALS — BP 100/78 | HR 68 | Temp 98.0°F | Ht 63.39 in | Wt 340.2 lb

## 2020-04-17 DIAGNOSIS — Z860100 Personal history of colon polyps, unspecified: Secondary | ICD-10-CM

## 2020-04-17 DIAGNOSIS — K649 Unspecified hemorrhoids: Secondary | ICD-10-CM | POA: Diagnosis not present

## 2020-04-17 MED ORDER — HYDROCORTISONE (PERIANAL) 2.5 % EX CREA: 1.0000 "application " | TOPICAL_CREAM | Freq: Every day | CUTANEOUS | 1 refills | Status: AC

## 2020-04-17 NOTE — Progress Notes (Signed)
IMPRESSION and PLAN:    59 yo female with pmh significant for colon polyps, GERD, DVT, IVC filter, CHF, thyroid cancer that is post partial thyroidectomy, hypothyroidism, morbid obesity, cholecystectomy  # Recurrent painless rectal bleeding with bowel movements, resolved --Lasted two days. No bleeding in last 6 days  --Denies constipation/straining --Nontender on DRE making fissure unlikely.  Patient feels like the volume of blood loss was more than during previous times of hemorrhoidal bleeding.  She has no known diverticular disease based on colonoscopy in 2019.  Still suspect bleeding was from internal hemorrhoids --Though bleeding stopped 6 days ago will treat with Anusol cream per rectum x10 days --If she has recurrent bleeding in the near future then arrange for internal hemorrhoid banding --She complains of a 20-year history of external hemorrhoid pain.  Seen at [facility] many years ago, unhappy with interaction.  She plans to talk with her bariatric surgeon at [facility] about seeing a general surgeon there for the external hemorrhoids if they to do not improve with Anusol  # History of adenomatous colon polyps December 2019 --Recall colonoscopy due December 2024  HPI:    Primary GI: Dr. Ardis Hughs.   Chief complaint : Recurrent rectal bleeding  Patient is a 59 year old female who was seen in 2019 for evaluation of rectal bleeding.  She had a complete colonoscopy with a good prep for evaluation of the bleeding December 2019 with findings of hemorrhoids and 2 small adenomas.   Last Wednesday patient began having rectal bleeding with her bowel movement.  After the first episode she had some transient RLQ while standing up from the toilet.  The pain quickly resolved and has not returned.  She continued to have rectal bleeding throughout the remainder of Wednesday and Thursday but the amount of blood got less with every bowel movement.  No associated rectal pain.  She denies  constipation/straining.  She has used Preparation H in the past but did not for this episode.  She feels like the bleeding this past episode was more than in previous times of hemorrhoidal bleeding.  She has been dealing with hemorrhoids for 20+ years.  It is often uncomfortable for her to sit due to external hemorrhoids.  Years ago she saw a Psychologist, sport and exercise at [place]. she was unhappy with the interaction but is open to another surgical evaluation at some point  Patient is status post Roux-en-Y gastric bypass in 2004 in [place].  She is being followed at [facility] and it sounds that she is in need of some type of revision. She had a great response to gastric bypass but then gained a significant amount of weight.    Upper GI 04/05/2018 remarkable for 2 cm hiatal hernia and a relatively long gastric pouch.  There appears to be a narrowing in the mid aspect of her pouch, esophagus mildly dilated.  GERD noted    Data Reviewed:  Colonoscopy 12/08/18 Evaluation of bleeding -Two 3 to 5 mm polyps in the ascending colon and in the cecum -External and internal hemorrhoids.   EGD 12/08/18 Benign GE junction focal stricture (Schatzki's ring), dilated with TTS balloon to 8mm. - Post bariatric surgery anatomy (typically small gastric pouch, normal GJ anastomosis). - No specimens collected.  Review of systems:     No chest pain, no SOB, no fevers, no urinary sx   Past Medical History:  Diagnosis Date  . Allergy   . Anemia   . Anxiety   .  Chronic venous insufficiency   . Depression   . Family history of adverse reaction to anesthesia   . GERD (gastroesophageal reflux disease)   . Greenfield filter in place   . History of DVT (deep vein thrombosis)   . Hypothyroidism   . Thyroid cancer ([place]) dx'd 2008   surg only    Patient's surgical history, family medical history, social history, medications and allergies were all reviewed in Epic   Creatinine clearance cannot be calculated (Patient's most recent lab result  is older than the maximum 21 days allowed.)  Current Outpatient Medications  Medication Sig Dispense Refill  . cyanocobalamin (,VITAMIN B-12,) 1000 MCG/ML injection Inject 1 mL (1,000 mcg total) into the muscle every 30 (thirty) days. 10 mL 3  . cyclobenzaprine (FLEXERIL) 10 MG tablet TAKE 2 TABLETS (20 MG TOTAL) BY MOUTH AT BEDTIME. 60 tablet 5  . diphenhydramine-acetaminophen (TYLENOL PM) 25-500 MG TABS tablet Take 2 tablets by mouth at bedtime.    . ergocalciferol (VITAMIN D2) 1.25 MG (50000 UT) capsule Take 1 capsule (50,000 Units total) by mouth 2 (two) times a week. 30 capsule 1  . folic acid (FOLVITE) 1 MG tablet TAKE 1 TABLET BY MOUTH EVERY DAY 90 tablet 3  . levothyroxine (SYNTHROID) 112 MCG tablet Take 2 tablets (224 mcg total) by mouth daily before breakfast. 60 tablet 3  . methylcellulose (CITRUCEL) oral powder Take 1 packet by mouth daily as needed (constipation).     . Multiple Vitamin (MULTIVITAMIN WITH MINERALS) TABS tablet Take 1 tablet by mouth at bedtime.    . Needles & Syringes MISC 1 Syringe by Does not apply route every 30 (thirty) days. 12 Syringe 3  . omeprazole (PRILOSEC) 20 MG capsule Take 1 capsule (20 mg total) by mouth daily. (Patient taking differently: Take 20 mg by mouth at bedtime. ) 90 capsule 3  . prochlorperazine (COMPAZINE) 5 MG tablet Take 1 tablet (5 mg total) by mouth every 6 (six) hours as needed for nausea or vomiting. 30 tablet 0  . EPINEPHrine 0.3 mg/0.3 mL IJ SOAJ injection Inject 0.3 mg into the muscle daily as needed for anaphylaxis.  1   No current facility-administered medications for this visit.    Physical Exam:     BP 100/78 (BP Location: Left Wrist, Patient Position: Sitting, Cuff Size: Normal)   Pulse 68   Temp 98 F (36.7 C)   Ht 5' 3.39" (1.61 m) Comment: height measured without shoes  Wt (!) 340 lb 4 oz (154.3 kg)   BMI 59.54 kg/m   GENERAL:  Pleasant female in NAD PSYCH: : Cooperative, normal affect CARDIAC:  RRR,  PULM:  Normal respiratory effort, lungs CTA bilaterally, no wheezing ABDOMEN:  Nondistended, soft, nontender. No obvious masses, no hepatomegaly,  normal bowel sounds RECTAL:  External hemorrhoids.  Anoscopy not performed as she had a colonoscopy in 2019 with documented internal hemorrhoids.  Her DRE today did not cause any discomfort so doubt fissure NEURO: Alert and oriented x 3, no focal neurologic deficits   Tye Savoy , NP 04/17/2020, 1:23 PM

## 2020-04-17 NOTE — Progress Notes (Signed)
I agree with the above note, plan 

## 2020-04-17 NOTE — Patient Instructions (Signed)
If you are age 59 or older, your body mass index should be between 23-30. Your Body mass index is 59.54 kg/m. If this is out of the aforementioned range listed, please consider follow up with your Primary Care Provider.  If you are age 20 or younger, your body mass index should be between 19-25. Your Body mass index is 59.54 kg/m. If this is out of the aformentioned range listed, please consider follow up with your Primary Care Provider.   We have sent the following medications to your pharmacy for you to pick up at your convenience: Hydrocortisone cream.

## 2020-04-23 ENCOUNTER — Other Ambulatory Visit: Payer: Self-pay

## 2020-04-25 ENCOUNTER — Ambulatory Visit: Payer: Medicare Other | Admitting: Endocrinology

## 2020-04-29 ENCOUNTER — Ambulatory Visit: Payer: Medicare Other

## 2020-05-02 ENCOUNTER — Ambulatory Visit: Payer: Medicare Other | Admitting: Nurse Practitioner

## 2020-05-07 DIAGNOSIS — G43109 Migraine with aura, not intractable, without status migrainosus: Secondary | ICD-10-CM | POA: Diagnosis not present

## 2020-05-27 ENCOUNTER — Encounter: Payer: Self-pay | Admitting: Family Medicine

## 2020-05-27 MED ORDER — SULFAMETHOXAZOLE-TRIMETHOPRIM 800-160 MG PO TABS: 1.0000 | ORAL_TABLET | Freq: Two times a day (BID) | ORAL | 0 refills | Status: DC

## 2020-05-30 ENCOUNTER — Ambulatory Visit: Payer: Medicare Other

## 2020-06-06 ENCOUNTER — Ambulatory Visit: Payer: Medicare Other | Admitting: Internal Medicine

## 2020-06-07 ENCOUNTER — Ambulatory Visit (INDEPENDENT_AMBULATORY_CARE_PROVIDER_SITE_OTHER): Payer: Medicare Other | Admitting: Family Medicine

## 2020-06-07 ENCOUNTER — Encounter: Payer: Self-pay | Admitting: Family Medicine

## 2020-06-07 ENCOUNTER — Other Ambulatory Visit: Payer: Self-pay

## 2020-06-07 VITALS — BP 112/78 | HR 57 | Temp 97.3°F | Wt 344.0 lb

## 2020-06-07 DIAGNOSIS — R350 Frequency of micturition: Secondary | ICD-10-CM

## 2020-06-07 DIAGNOSIS — R195 Other fecal abnormalities: Secondary | ICD-10-CM | POA: Diagnosis not present

## 2020-06-07 DIAGNOSIS — N3090 Cystitis, unspecified without hematuria: Secondary | ICD-10-CM | POA: Diagnosis not present

## 2020-06-07 DIAGNOSIS — R3915 Urgency of urination: Secondary | ICD-10-CM | POA: Diagnosis not present

## 2020-06-07 LAB — POCT URINALYSIS DIPSTICK
Bilirubin, UA: NEGATIVE
Glucose, UA: NEGATIVE
Ketones, UA: NEGATIVE
Nitrite, UA: NEGATIVE
Protein, UA: NEGATIVE
Spec Grav, UA: 1.015 (ref 1.010–1.025)
Urobilinogen, UA: 1 E.U./dL
pH, UA: 6 (ref 5.0–8.0)

## 2020-06-07 MED ORDER — NITROFURANTOIN MONOHYD MACRO 100 MG PO CAPS: 100.0000 mg | ORAL_CAPSULE | Freq: Two times a day (BID) | ORAL | 0 refills | Status: DC

## 2020-06-07 NOTE — Progress Notes (Signed)
Subjective:    Patient ID: [name], female    DOB: 1961-05-06, 59 y.o.   MRN: [id number]  HPI Chief Complaint  Patient presents with  . Urinary Tract Infection    Urinary urgency and frequency x 2 days. Urine also has an odor. Pt states that she is getting UTIs more frequent - in the last few months this is her 4th UTI. Denies flank pain, lower abd pain or pressure   Has frequent loose bowel movements, large hemorrhoids. BMs most days, not every day. Some urgency. Recently saw GI, may need banding. Drinks 2-4 bottles of Gatorade and water daily.  No nausea/ vomiting, some back pain last night- not uncommon for her, no fever.    Review of Systems    per HPI Objective:   Physical Exam Physical Exam  Constitutional: She is oriented to person, place, and time. She appears well-developed and well-nourished. No distress.  HENT:  Head: Normocephalic and atraumatic.  Cardiovascular: Normal rate, regular rhythm and normal heart sounds.   Pulmonary/Chest: Effort normal and breath sounds normal.  Abdominal: Soft. She exhibits no distension. There is no tenderness. There is no rebound, no guarding and no CVA tenderness.  Neurological: She is alert and oriented to person, place, and time.  Skin: Skin is warm and dry. She is not diaphoretic.  Psychiatric: She has a normal mood and affect. Her behavior is normal. Judgment and thought content normal.  Vitals reviewed.     BP 112/78 (BP Location: Left Wrist, Patient Position: Sitting, Cuff Size: Normal)   Pulse (!) 57   Temp (!) 97.3 F (36.3 C) (Temporal)   Wt (!) 344 lb (156 kg)   SpO2 99%   BMI 60.20 kg/m  Wt Readings from Last 3 Encounters:  06/07/20 (!) 344 lb (156 kg)  04/17/20 (!) 340 lb 4 oz (154.3 kg)  02/20/20 (!) 343 lb 6.4 oz (155.8 kg)   Results for orders placed or performed in visit on 06/07/20  Urinalysis Dipstick  Result Value Ref Range   Color, UA yellow    Clarity, UA cloudy    Glucose, UA Negative  Negative   Bilirubin, UA neg    Ketones, UA neg    Spec Grav, UA 1.015 1.010 - 1.025   Blood, UA trace    pH, UA 6.0 5.0 - 8.0   Protein, UA Negative Negative   Urobilinogen, UA 1.0 0.2 or 1.0 E.U./dL   Nitrite, UA neg    Leukocytes, UA Moderate (2+) (A) Negative   Appearance     Odor      Assessment & Plan:  1. Urinary frequency - Urinalysis Dipstick - Urine Culture  2. Urinary urgency - Urinalysis Dipstick - Urine Culture  3. Cystitis - Provided written and verbal information regarding diagnosis and treatment. - RTC/ER precautions reviewed - Urine Culture - nitrofurantoin, macrocrystal-monohydrate, (MACROBID) 100 MG capsule; Take 1 capsule (100 mg total) by mouth 2 (two) times daily.  Dispense: 14 capsule; Refill: 0  4. Loose bowel movements - could be contributing to UTI, last culture showed e. Coli.  - discussed using fiber to bulk up stools  This visit occurred during the SARS-CoV-2 public health emergency.  Safety protocols were in place, including screening questions prior to the visit, additional usage of staff PPE, and extensive cleaning of exam room while observing appropriate contact time as indicated for disinfecting solutions.    Clarene Reamer, FNP-BC  [facility], [place]  06/07/2020 9:46 AM

## 2020-06-07 NOTE — Patient Instructions (Signed)
Urinary Tract Infection, Adult A urinary tract infection (UTI) is an infection of any part of the urinary tract. The urinary tract includes:  The kidneys.  The ureters.  The bladder.  The urethra. These organs make, store, and get rid of pee (urine) in the body. What are the causes? This is caused by germs (bacteria) in your genital area. These germs grow and cause swelling (inflammation) of your urinary tract. What increases the risk? You are more likely to develop this condition if:  You have a small, thin tube (catheter) to drain pee.  You cannot control when you pee or poop (incontinence).  You are female, and: ? You use these methods to prevent pregnancy:  A medicine that kills sperm (spermicide).  A device that blocks sperm (diaphragm). ? You have low levels of a female hormone (estrogen). ? You are pregnant.  You have genes that add to your risk.  You are sexually active.  You take antibiotic medicines.  You have trouble peeing because of: ? A prostate that is bigger than normal, if you are female. ? A blockage in the part of your body that drains pee from the bladder (urethra). ? A kidney stone. ? A nerve condition that affects your bladder (neurogenic bladder). ? Not getting enough to drink. ? Not peeing often enough.  You have other conditions, such as: ? Diabetes. ? A weak disease-fighting system (immune system). ? Sickle cell disease. ? Gout. ? Injury of the spine. What are the signs or symptoms? Symptoms of this condition include:  Needing to pee right away (urgently).  Peeing often.  Peeing small amounts often.  Pain or burning when peeing.  Blood in the pee.  Pee that smells bad or not like normal.  Trouble peeing.  Pee that is cloudy.  Fluid coming from the vagina, if you are female.  Pain in the belly or lower back. Other symptoms include:  Throwing up (vomiting).  No urge to eat.  Feeling mixed up (confused).  Being tired  and grouchy (irritable).  A fever.  Watery poop (diarrhea). How is this treated? This condition may be treated with:  Antibiotic medicine.  Other medicines.  Drinking enough water. Follow these instructions at home:  Medicines  Take over-the-counter and prescription medicines only as told by your doctor.  If you were prescribed an antibiotic medicine, take it as told by your doctor. Do not stop taking it even if you start to feel better. General instructions  Make sure you: ? Pee until your bladder is empty. ? Do not hold pee for a long time. ? Empty your bladder after sex. ? Wipe from front to back after pooping if you are a female. Use each tissue one time when you wipe.  Drink enough fluid to keep your pee pale yellow.  Keep all follow-up visits as told by your doctor. This is important. Contact a doctor if:  You do not get better after 1-2 days.  Your symptoms go away and then come back. Get help right away if:  You have very bad back pain.  You have very bad pain in your lower belly.  You have a fever.  You are sick to your stomach (nauseous).  You are throwing up. Summary  A urinary tract infection (UTI) is an infection of any part of the urinary tract.  This condition is caused by germs in your genital area.  There are many risk factors for a UTI. These include having a small, thin   tube to drain pee and not being able to control when you pee or poop.  Treatment includes antibiotic medicines for germs.  Drink enough fluid to keep your pee pale yellow. This information is not intended to replace advice given to you by your health care provider. Make sure you discuss any questions you have with your health care provider. Document Revised: 11/24/2018 Document Reviewed: 06/16/2018 Elsevier Patient Education  2020 Elsevier Inc.  

## 2020-06-08 LAB — URINE CULTURE
MICRO NUMBER:: 10608245
SPECIMEN QUALITY:: ADEQUATE

## 2020-06-28 ENCOUNTER — Ambulatory Visit: Payer: Medicare Other

## 2020-06-29 ENCOUNTER — Other Ambulatory Visit: Payer: Self-pay | Admitting: Endocrinology

## 2020-06-30 NOTE — Telephone Encounter (Signed)
1.  Please schedule f/u appt 2.  Then please refill x 1, pending that appt.  

## 2020-07-01 ENCOUNTER — Other Ambulatory Visit: Payer: Self-pay | Admitting: Internal Medicine

## 2020-07-03 NOTE — Telephone Encounter (Signed)
Left message for patient to call back to schedule appointment with Dr Loanne Drilling.

## 2020-07-08 ENCOUNTER — Other Ambulatory Visit: Payer: Self-pay | Admitting: Endocrinology

## 2020-07-29 ENCOUNTER — Ambulatory Visit: Payer: Medicare Other

## 2020-08-12 DIAGNOSIS — Z20822 Contact with and (suspected) exposure to covid-19: Secondary | ICD-10-CM | POA: Diagnosis not present

## 2020-08-29 ENCOUNTER — Ambulatory Visit: Payer: Medicare Other

## 2020-09-12 DIAGNOSIS — Z1159 Encounter for screening for other viral diseases: Secondary | ICD-10-CM | POA: Diagnosis not present

## 2020-09-27 ENCOUNTER — Ambulatory Visit: Payer: Medicare Other

## 2020-09-27 DIAGNOSIS — Z1159 Encounter for screening for other viral diseases: Secondary | ICD-10-CM | POA: Diagnosis not present

## 2020-10-07 DIAGNOSIS — Z23 Encounter for immunization: Secondary | ICD-10-CM | POA: Diagnosis not present

## 2020-10-24 IMAGING — US US THYROID
1 series · 13 of 25 positions shown · non-contrast
Comparison: 08/07/2014

CLINICAL DATA: Prior ultrasound follow-up. History of left thyroid
lobectomy. Chronic thyroiditis.

EXAM:
THYROID ULTRASOUND
TECHNIQUE: Ultrasound examination of the thyroid gland and adjacent soft
tissues was performed.

[Series 1: us thyroid · 0.04mm/px · 13 of 30 slices shown]
[im 1/30]
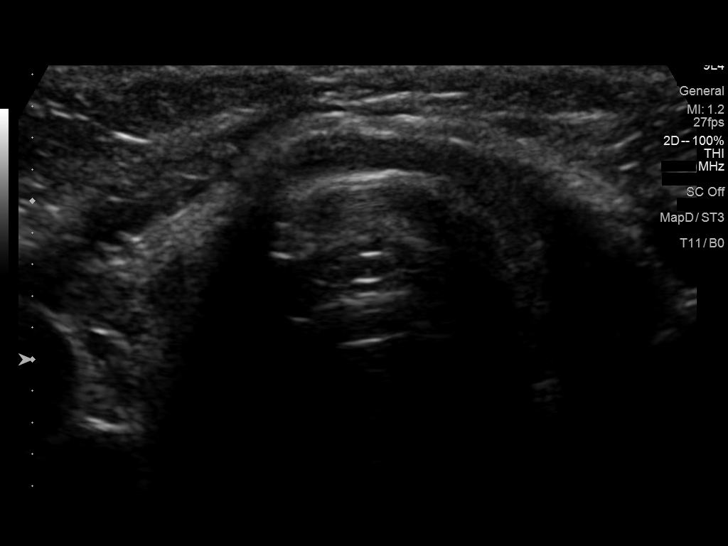
[im 3/30]
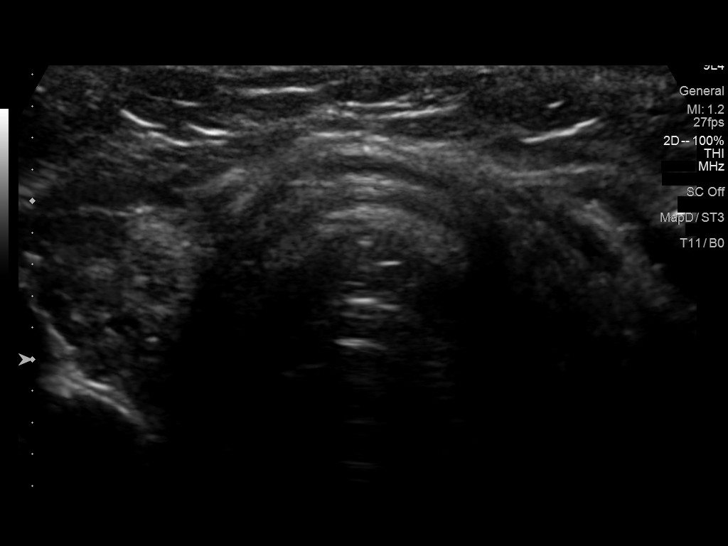
[im 5/30]
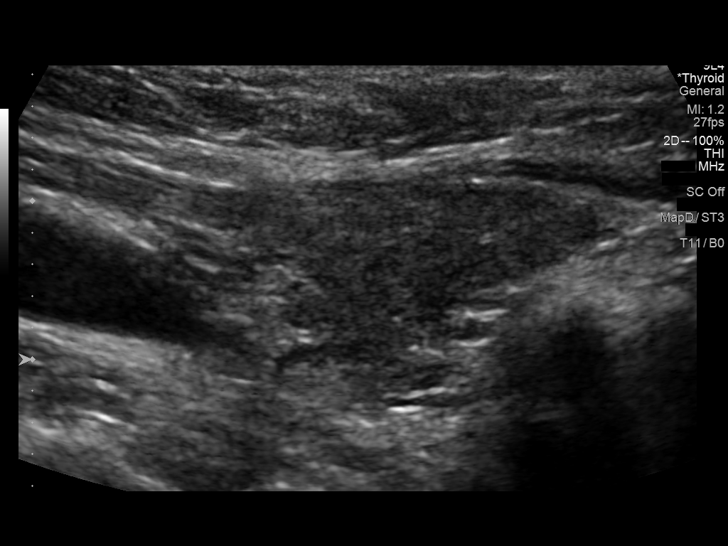
[im 8/30]
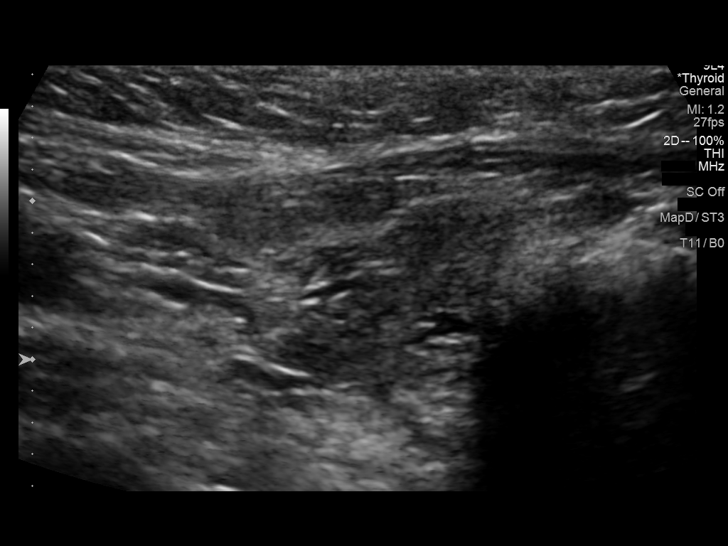
[im 10/30]
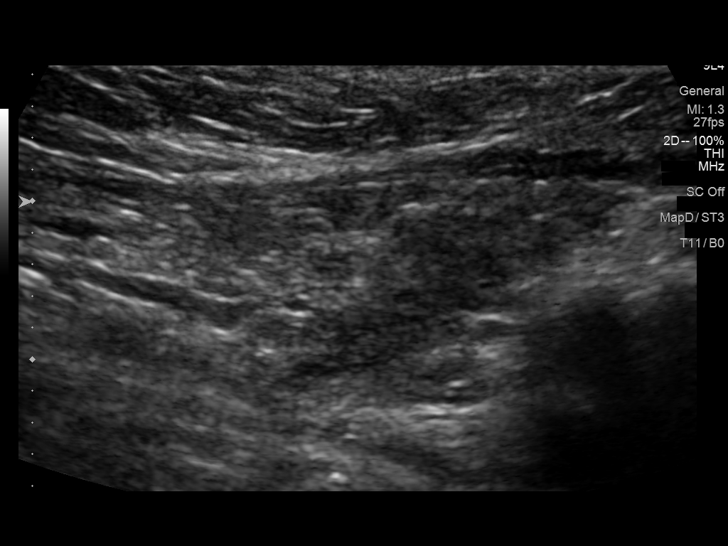
[im 13/30]
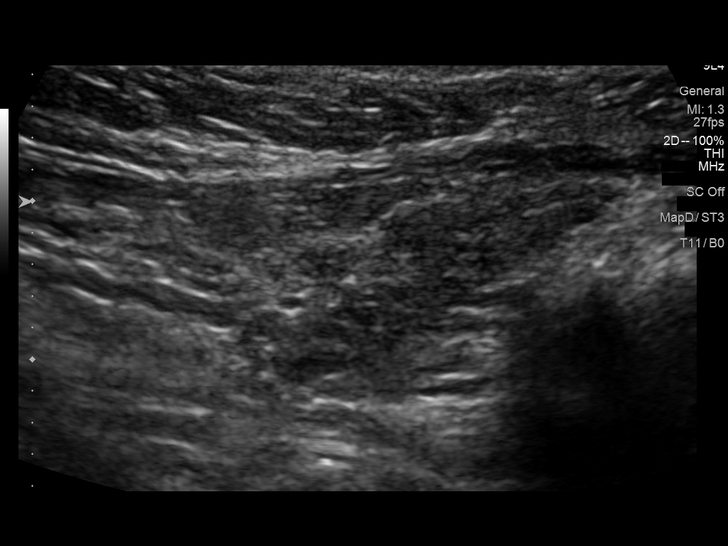
[im 15/30]
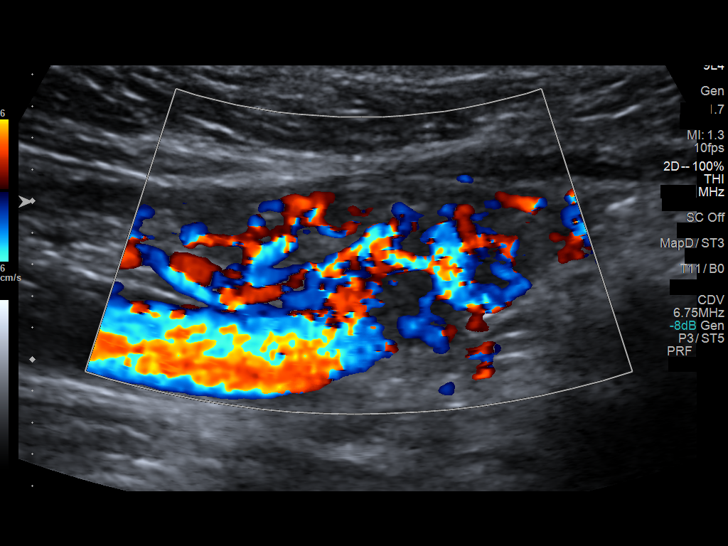
[im 17/30]
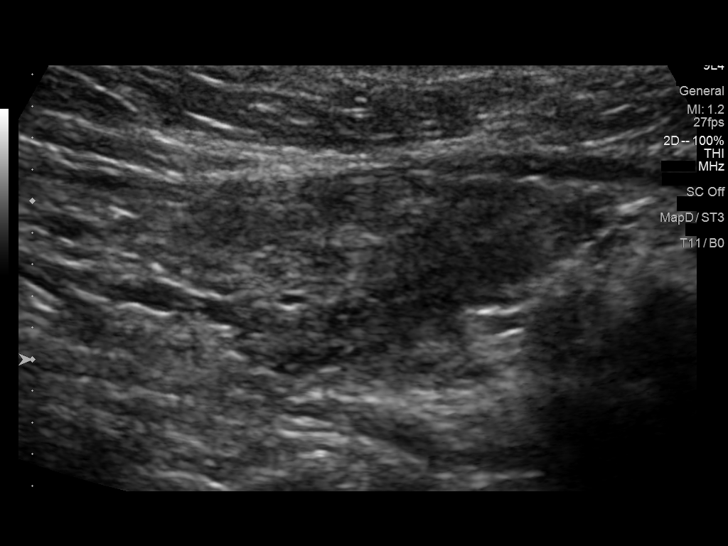
[im 20/30]
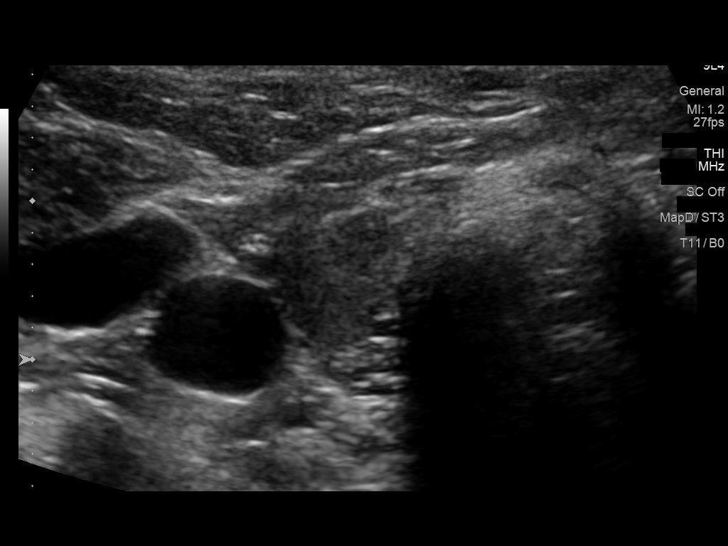
[im 22/30]
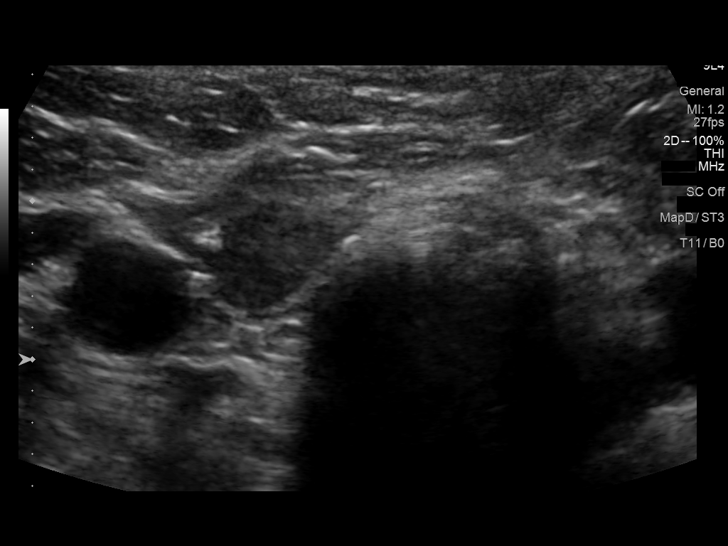
[im 25/30]
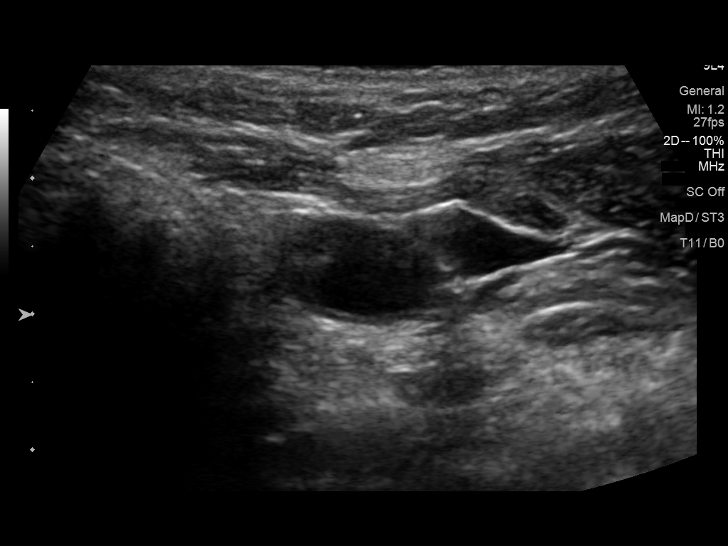
[im 27/30]
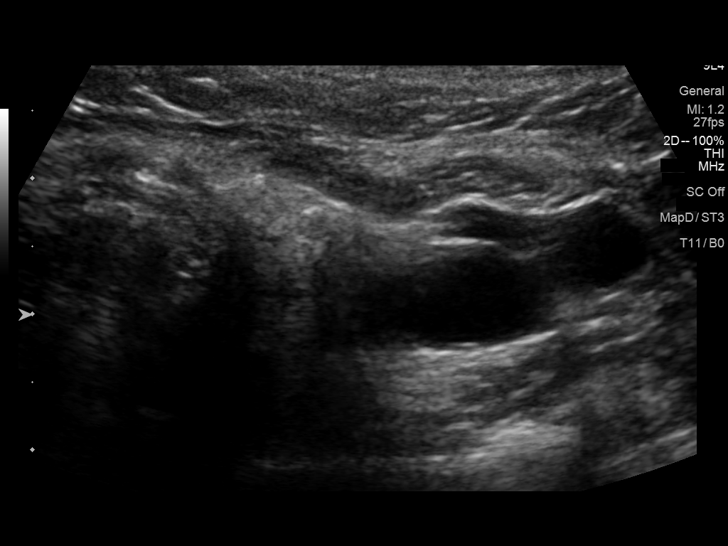
[im 30/30]
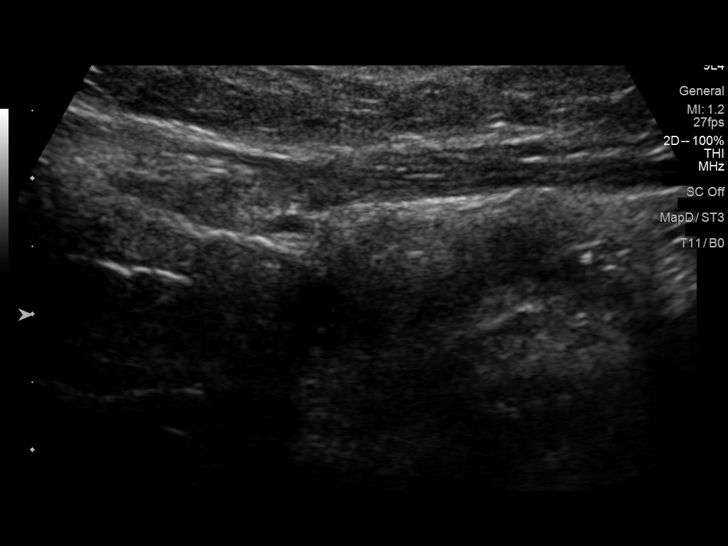

[13 of 25 positions shown; findings below may reference images not displayed]

FINDINGS: Parenchymal Echotexture: Markedly heterogenous

Isthmus: Normal in size measures 0.2 cm in diameter, unchanged

Right lobe: Atrophic measuring 3.0 x 1.2 x 1.2 cm, previously, 3.8 x
1.5 x 1.2 cm

Left lobe: There is surgically absent there is no residual nodular
soft tissue within the left lobectomy resection bed

_________________________________________________________

Estimated total number of nodules >/= 1 cm: 0

Number of spongiform nodules >/=  2 cm not described below (TR1): 0

Number of mixed cystic and solid nodules >/= 1.5 cm not described
below (TR2): 0

_________________________________________________________

No discrete nodules are seen within the thyroid gland.

No definitive regional cervical lymphadenopathy.
IMPRESSION: 1. Post left thyroid lobectomy without evidence of residual or
locally recurrent disease.
2. Similar appearing markedly heterogeneous and atrophic remaining
thyroid parenchyma, nonspecific though compatible with provided
history of chronic thyroiditis.

## 2020-10-28 ENCOUNTER — Ambulatory Visit: Payer: Medicare Other

## 2020-11-05 DIAGNOSIS — Z1159 Encounter for screening for other viral diseases: Secondary | ICD-10-CM | POA: Diagnosis not present

## 2020-11-06 ENCOUNTER — Other Ambulatory Visit: Payer: Self-pay

## 2020-11-06 ENCOUNTER — Encounter: Payer: Self-pay | Admitting: Internal Medicine

## 2020-11-06 ENCOUNTER — Ambulatory Visit (INDEPENDENT_AMBULATORY_CARE_PROVIDER_SITE_OTHER): Payer: Medicare Other | Admitting: Internal Medicine

## 2020-11-06 DIAGNOSIS — R300 Dysuria: Secondary | ICD-10-CM | POA: Diagnosis not present

## 2020-11-06 MED ORDER — TRIMETHOPRIM 100 MG PO TABS: 100.0000 mg | ORAL_TABLET | Freq: Two times a day (BID) | ORAL | 0 refills | Status: DC

## 2020-11-06 MED ORDER — ESTRADIOL 0.1 MG/GM VA CREA: 1.0000 | TOPICAL_CREAM | VAGINAL | 12 refills | Status: AC

## 2020-11-06 NOTE — Progress Notes (Signed)
Subjective:    Patient ID: [name], female    DOB: 1961-04-16, 59 y.o.   MRN: [id number]  HPI Here due to persistent urinary symptoms This visit occurred during the SARS-CoV-2 public health emergency.  Safety protocols were in place, including screening questions prior to the visit, additional usage of staff PPE, and extensive cleaning of exam room while observing appropriate contact time as indicated for disinfecting solutions.   Has been getting pressure and burning dysuria Bad urgency---some incontinence (for the past month) Smell is bad---and not limiting fluids  No fever Uses Azo regularly---would help at first but then it recurs Hasn't used nitrofurantoin she had Does note vaginal atrophy  Current Outpatient Medications on File Prior to Visit  Medication Sig Dispense Refill   cyanocobalamin (,VITAMIN B-12,) 1000 MCG/ML injection Inject 1 mL (1,000 mcg total) into the muscle every 30 (thirty) days. 10 mL 3   cyclobenzaprine (FLEXERIL) 10 MG tablet TAKE 2 TABLETS (20 MG TOTAL) BY MOUTH AT BEDTIME. 60 tablet 5   diphenhydramine-acetaminophen (TYLENOL PM) 25-500 MG TABS tablet Take 2 tablets by mouth at bedtime.     EPINEPHRINE 0.3 mg/0.3 mL IJ SOAJ injection INJECT 0.3 MLS (0.3 MG TOTAL) INTO THE MUSCLE ONCE FOR 1 DOSE. 2 each 1   ergocalciferol (VITAMIN D2) 1.25 MG (50000 UT) capsule Take 1 capsule (50,000 Units total) by mouth 2 (two) times a week. 30 capsule 1   folic acid (FOLVITE) 1 MG tablet TAKE 1 TABLET BY MOUTH EVERY DAY 90 tablet 3   hydrocortisone (ANUSOL-HC) 2.5 % rectal cream Place 1 application rectally at bedtime. 30 g 1   levothyroxine (SYNTHROID) 112 MCG tablet Take 2 tablets (224 mcg total) by mouth daily before breakfast. 60 tablet 3   methylcellulose (CITRUCEL) oral powder Take 1 packet by mouth daily as needed (constipation).      Multiple Vitamin (MULTIVITAMIN WITH MINERALS) TABS tablet Take 1 tablet by mouth at bedtime.     Needles &  Syringes MISC 1 Syringe by Does not apply route every 30 (thirty) days. 12 Syringe 3   omeprazole (PRILOSEC) 20 MG capsule Take 1 capsule (20 mg total) by mouth daily. (Patient taking differently: Take 20 mg by mouth at bedtime. ) 90 capsule 3   prochlorperazine (COMPAZINE) 5 MG tablet Take 1 tablet (5 mg total) by mouth every 6 (six) hours as needed for nausea or vomiting. (Patient not taking: Reported on 11/06/2020) 30 tablet 0   No current facility-administered medications on file prior to visit.    Allergies  Allergen Reactions   Ambien [Zolpidem Tartrate]     Black outs   Food Anaphylaxis, Diarrhea and Nausea And Vomiting    Bananas-anaphylactic Kiwi-anaphylactic Mushroom--nausea/vomiting Beer- anaphylactic   Latex Other (See Comments)    REACTION: "anaphylactic shock"   Ramelteon Other (See Comments)    REACTION: blacks out ([place])   Tetracyclines & Related Anaphylaxis and Other (See Comments)    Syncope     Past Medical History:  Diagnosis Date   Allergy    Anemia    Anxiety    Chronic venous insufficiency    Depression    Family history of adverse reaction to anesthesia    GERD (gastroesophageal reflux disease)    Greenfield filter in place    History of DVT (deep vein thrombosis)    Hypothyroidism    Thyroid cancer ([place]) dx'd 2008   surg only    Past Surgical History:  Procedure Laterality Date   ABDOMINAL HYSTERECTOMY  BALLOON DILATION N/A 12/08/2018   Procedure: BALLOON DILATION;  Surgeon: Milus Banister, MD;  Location: [facility];  Service: Endoscopy;  Laterality: N/A;   BIOPSY THYROID  01/03   suggestive of [place]   COLONOSCOPY WITH PROPOFOL N/A 12/08/2018   Procedure: COLONOSCOPY WITH PROPOFOL;  Surgeon: Milus Banister, MD;  Location: [facility];  Service: Endoscopy;  Laterality: N/A;   CRANIECTOMY SUBOCCIPITAL W/ CERVICAL LAMINECTOMY / CHIARI  05/2010    repair, Dr.Pool   DILATION AND CURETTAGE OF UTERUS  2003   ESOPHAGOGASTRODUODENOSCOPY (EGD) WITH PROPOFOL N/A 12/08/2018   Procedure: ESOPHAGOGASTRODUODENOSCOPY (EGD) WITH PROPOFOL;  Surgeon: Milus Banister, MD;  Location: [facility];  Service: Endoscopy;  Laterality: N/A;   FETAL SURGERY FOR CONGENITAL HERNIA     GASTRIC BYPASS     POLYPECTOMY  12/08/2018   Procedure: POLYPECTOMY;  Surgeon: Milus Banister, MD;  Location: [facility];  Service: Endoscopy;;   THYROIDECTOMY  2008   left   TONSILLECTOMY  1965   TUBAL LIGATION  1998   VEIN LIGATION  1995/1998   right leg    Family History  Problem Relation Age of Onset   Arthritis Mother    Coronary artery disease Father    Kidney cancer Father    Lung cancer Father    Coronary artery disease Sister    Ovarian cancer Maternal Grandmother    Thyroid disease Neg Hx    Colon cancer Neg Hx    Esophageal cancer Neg Hx    Pancreatic cancer Neg Hx    Stomach cancer Neg Hx     Social History   Socioeconomic History   Marital status: Divorced    Spouse name: Not on file   Number of children: 3   Years of education: Not on file   Highest education level: Not on file  Occupational History   Occupation: Part time Environmental manager  Tobacco Use   Smoking status: Never Smoker   Smokeless tobacco: Never Used  Substance and Sexual Activity   Alcohol use: Yes    Alcohol/week: 0.0 standard drinks    Comment: rare   Drug use: No   Sexual activity: Not on file  Other Topics Concern   Not on file  Social History Narrative   No living will   Requests mother to make health care decision   Would accept resuscitation attempts   Not sure about tube feeds         Social Determinants of Health   Financial Resource Strain:    Difficulty of Paying Living Expenses: Not on file  Food Insecurity:    Worried About [place] in the Last Year: Not on file   Ran Out of Food in the Last  Year: Not on file  Transportation Needs:    Lack of Transportation (Medical): Not on file   Lack of Transportation (Non-Medical): Not on file  Physical Activity:    Days of Exercise per Week: Not on file   Minutes of Exercise per Session: Not on file  Stress:    Feeling of Stress : Not on file  Social Connections:    Frequency of Communication with Friends and Family: Not on file   Frequency of Social Gatherings with Friends and Family: Not on file   Attends Religious Services: Not on file   Active Member of Clubs or Organizations: Not on file   Attends Archivist Meetings: Not  on file   Marital Status: Not on file  Intimate Partner Violence:    Fear of Current or Ex-Partner: Not on file   Emotionally Abused: Not on file   Physically Abused: Not on file   Sexually Abused: Not on file   Review of Systems No change in chronic low back pain No N/V    Objective:   Physical Exam Constitutional:      Appearance: Normal appearance.  Abdominal:     Palpations: Abdomen is soft.     Tenderness: There is no abdominal tenderness.  Neurological:     Mental Status: She is alert.            Assessment & Plan:

## 2020-11-06 NOTE — Assessment & Plan Note (Signed)
Persistent Can't check urinalysis due to azo---will send culture 3 days of macrobid for now Start 1 month of trimethoprim and topical estrogen

## 2020-11-06 NOTE — Addendum Note (Signed)
Addended by: Pilar Grammes on: 11/06/2020 08:45 AM   Modules accepted: Orders

## 2020-11-06 NOTE — Patient Instructions (Signed)
Please take the 3 days of nitrofurantoin---then start the month of daily trimethoprim. Start the vaginal estrogen with an applicatorful 3 times a week

## 2020-11-07 LAB — URINE CULTURE
MICRO NUMBER:: 11215601
SPECIMEN QUALITY:: ADEQUATE

## 2020-11-27 ENCOUNTER — Ambulatory Visit: Payer: Medicare Other

## 2020-12-24 ENCOUNTER — Other Ambulatory Visit: Payer: Self-pay

## 2020-12-24 ENCOUNTER — Telehealth (INDEPENDENT_AMBULATORY_CARE_PROVIDER_SITE_OTHER): Payer: Medicare Other | Admitting: Internal Medicine

## 2020-12-24 ENCOUNTER — Encounter: Payer: Self-pay | Admitting: Internal Medicine

## 2020-12-24 DIAGNOSIS — F320 Major depressive disorder, single episode, mild: Secondary | ICD-10-CM | POA: Diagnosis not present

## 2020-12-24 MED ORDER — FLUOXETINE HCL 20 MG PO CAPS: 20.0000 mg | ORAL_CAPSULE | Freq: Every day | ORAL | 3 refills | Status: DC

## 2020-12-24 NOTE — Assessment & Plan Note (Signed)
Clear multiple symptoms for over 2 weeks (much more) Anxiety seems to be secondary Will start fluoxetine 20mg  every other day for a week--and then daily Follow up 2-3 weeks
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
## 2020-12-24 NOTE — Patient Instructions (Signed)
Please start the fluoxetine 20mg  capsule every other day for a week--then increase to daily. Let me know if you have any problems with this. Someone will call about setting up a follow up in 2-3 weeks.

## 2020-12-27 ENCOUNTER — Ambulatory Visit: Payer: Medicare Other

## 2020-12-30 DIAGNOSIS — Z1159 Encounter for screening for other viral diseases: Secondary | ICD-10-CM | POA: Diagnosis not present

## 2021-01-09 ENCOUNTER — Encounter: Payer: Self-pay | Admitting: Internal Medicine

## 2021-01-09 ENCOUNTER — Telehealth (INDEPENDENT_AMBULATORY_CARE_PROVIDER_SITE_OTHER): Payer: Medicare Other | Admitting: Internal Medicine

## 2021-01-09 DIAGNOSIS — F320 Major depressive disorder, single episode, mild: Secondary | ICD-10-CM | POA: Diagnosis not present

## 2021-01-09 NOTE — Progress Notes (Signed)
Subjective:    Patient ID: [name], female    DOB: Nov 23, 1961, 60 y.o.   MRN: [id number]  HPI Video virtual visit for follow up of depression Identification done Reviewed billing and limitations and she gave consent Participants---patient in her home and I am in my office  Doing "okay" She thinks the medicine is taking the "edge" off Doesn't feel ready to burst into tears Felt "zoney" at first--then that faded away No other apparent side effects Ongoing family stress  Not sleeping well---due to mom needing help Appetite is down some Work is crazy---intermittent remote work, Social research officer, government. Not in normal routine--but keeps up No thoughts of suicide or dying  Current Outpatient Medications on File Prior to Visit  Medication Sig Dispense Refill  . cyanocobalamin (,VITAMIN B-12,) 1000 MCG/ML injection Inject 1 mL (1,000 mcg total) into the muscle every 30 (thirty) days. 10 mL 3  . cyclobenzaprine (FLEXERIL) 10 MG tablet TAKE 2 TABLETS (20 MG TOTAL) BY MOUTH AT BEDTIME. 60 tablet 5  . diphenhydramine-acetaminophen (TYLENOL PM) 25-500 MG TABS tablet Take 2 tablets by mouth at bedtime.    Marland Kitchen EPINEPHRINE 0.3 mg/0.3 mL IJ SOAJ injection INJECT 0.3 MLS (0.3 MG TOTAL) INTO THE MUSCLE ONCE FOR 1 DOSE. 2 each 1  . ergocalciferol (VITAMIN D2) 1.25 MG (50000 UT) capsule Take 1 capsule (50,000 Units total) by mouth 2 (two) times a week. 30 capsule 1  . estradiol (ESTRACE) 0.1 MG/GM vaginal cream Place 1 Applicatorful vaginally 3 (three) times a week. 42.5 g 12  . FLUoxetine (PROZAC) 20 MG capsule Take 1 capsule (20 mg total) by mouth daily. 30 capsule 3  . folic acid (FOLVITE) 1 MG tablet TAKE 1 TABLET BY MOUTH EVERY DAY 90 tablet 3  . hydrocortisone (ANUSOL-HC) 2.5 % rectal cream Place 1 application rectally at bedtime. 30 g 1  . levothyroxine (SYNTHROID) 112 MCG tablet Take 2 tablets (224 mcg total) by mouth daily before breakfast. 60 tablet 3  . methylcellulose oral powder Take 1 packet by mouth  daily as needed (constipation).     . Multiple Vitamin (MULTIVITAMIN WITH MINERALS) TABS tablet Take 1 tablet by mouth at bedtime.    . Needles & Syringes MISC 1 Syringe by Does not apply route every 30 (thirty) days. 12 Syringe 3  . omeprazole (PRILOSEC) 20 MG capsule Take 1 capsule (20 mg total) by mouth daily. (Patient taking differently: Take 20 mg by mouth at bedtime.) 90 capsule 3  . prochlorperazine (COMPAZINE) 5 MG tablet Take 1 tablet (5 mg total) by mouth every 6 (six) hours as needed for nausea or vomiting. 30 tablet 0   No current facility-administered medications on file prior to visit.    Allergies  Allergen Reactions  . Ambien [Zolpidem Tartrate]     [employer]  . Food Anaphylaxis, Diarrhea and Nausea And Vomiting    Bananas-anaphylactic Kiwi-anaphylactic Mushroom--nausea/vomiting Beer- anaphylactic  . Latex Other (See Comments)    REACTION: "anaphylactic shock"  . Ramelteon Other (See Comments)    REACTION: blacks out (Rozerem)  . Tetracyclines & Related Anaphylaxis and Other (See Comments)    Syncope     Past Medical History:  Diagnosis Date  . Allergy   . Anemia   . Anxiety   . Chronic venous insufficiency   . Depression   . Family history of adverse reaction to anesthesia   . GERD (gastroesophageal reflux disease)   . Greenfield filter in place   . History of DVT (deep vein thrombosis)   .  Hypothyroidism   . Thyroid cancer ([facility]) dx'd 2008   surg only    Past Surgical History:  Procedure Laterality Date  . ABDOMINAL HYSTERECTOMY    . BALLOON DILATION N/A 12/08/2018   Procedure: BALLOON DILATION;  Surgeon: [name] Banister, MD;  Location: [facility];  Service: Endoscopy;  Laterality: N/A;  . BIOPSY THYROID  01/03   suggestive of Hashimoto's  . CESAREAN SECTION  1998  . CHOLECYSTECTOMY  1982  . COLONOSCOPY WITH PROPOFOL N/A 12/08/2018   Procedure: COLONOSCOPY WITH PROPOFOL;  Surgeon: [name] Banister, MD;  Location: [facility];  Service:  Endoscopy;  Laterality: N/A;  . CRANIECTOMY SUBOCCIPITAL W/ CERVICAL LAMINECTOMY / CHIARI  05/2010   repair, Dr.Pool  . DILATION AND CURETTAGE OF UTERUS  2003  . ESOPHAGOGASTRODUODENOSCOPY (EGD) WITH PROPOFOL N/A 12/08/2018   Procedure: ESOPHAGOGASTRODUODENOSCOPY (EGD) WITH PROPOFOL;  Surgeon: [name] Banister, MD;  Location: [facility];  Service: Endoscopy;  Laterality: N/A;  . FETAL SURGERY FOR CONGENITAL HERNIA    . GASTRIC BYPASS    . POLYPECTOMY  12/08/2018   Procedure: POLYPECTOMY;  Surgeon: [name] Banister, MD;  Location: [facility];  Service: Endoscopy;;  . THYROIDECTOMY  2008   left  . TONSILLECTOMY  1965  . TUBAL LIGATION  1998  . VEIN LIGATION  1995/1998   right leg    Family History  Problem Relation Age of Onset  . Arthritis Mother   . Coronary artery disease Father   . Kidney cancer Father   . Lung cancer Father   . Coronary artery disease Sister   . Ovarian cancer Maternal Grandmother   . Thyroid disease Neg Hx   . Colon cancer Neg Hx   . Esophageal cancer Neg Hx   . Pancreatic cancer Neg Hx   . Stomach cancer Neg Hx     Social History   Socioeconomic History  . Marital status: Divorced    Spouse name: Not on file  . Number of children: 3  . Years of education: Not on file  . Highest education level: Not on file  Occupational History  . Occupation: Part time Environmental manager  Tobacco Use  . Smoking status: Never Smoker  . Smokeless tobacco: Never Used  Substance and Sexual Activity  . Alcohol use: Yes    Alcohol/week: 0.0 standard drinks    Comment: rare  . Drug use: No  . Sexual activity: Not on file  Other Topics Concern  . Not on file  Social History Narrative   No living will   Requests mother to make health care decision   Would accept resuscitation attempts   Not sure about tube feeds         Social Determinants of Health   Financial Resource Strain: Not on file  Food Insecurity: Not on file  Transportation Needs: Not on  file  Physical Activity: Not on file  Stress: Not on file  Social Connections: Not on file  Intimate Partner Violence: Not on file    Review of Systems She may have lost a little weight--hasn't weighed    Objective:   Physical Exam Constitutional:      Appearance: Normal appearance.  Psychiatric:     Comments: Normal speech and seems more upbeat            Assessment & Plan:

## 2021-01-09 NOTE — Assessment & Plan Note (Addendum)
Stress continues but she is dealing with it better Mind has calmed, not crying so easily Will continue the fluoxetine 20mg  daily Discussed tachyphylaxis---if the medicine doesn't seem to be working as well, we will just increase the dose Follow up 2-3 months Discussed that she should not stop before 6-9 months of remission (preferably when we decide together)--even if she feels good

## 2021-01-15 ENCOUNTER — Other Ambulatory Visit: Payer: Self-pay | Admitting: Internal Medicine

## 2021-01-15 NOTE — Telephone Encounter (Signed)
Pharmacy is requesting a 90 day supply. This is a new start. Not sure 90 days was appropriate.

## 2021-01-17 ENCOUNTER — Telehealth: Payer: Self-pay | Admitting: Internal Medicine

## 2021-01-17 NOTE — Telephone Encounter (Signed)
Dr Silvio Pate is requesting patient schedule PE in 2-3 months (will be due for follow up then but needs PE) . I left a detailed message on patient's voice mail to call back and schedule appointment.

## 2021-01-30 ENCOUNTER — Inpatient Hospital Stay: Payer: Medicare Other

## 2021-01-30 ENCOUNTER — Ambulatory Visit: Payer: Medicare Other | Admitting: Hematology and Oncology

## 2021-01-30 ENCOUNTER — Ambulatory Visit: Payer: Medicare Other

## 2021-02-05 ENCOUNTER — Other Ambulatory Visit: Payer: Self-pay | Admitting: *Deleted

## 2021-02-05 ENCOUNTER — Other Ambulatory Visit: Payer: Self-pay | Admitting: Hematology and Oncology

## 2021-02-05 DIAGNOSIS — D508 Other iron deficiency anemias: Secondary | ICD-10-CM

## 2021-02-05 DIAGNOSIS — E538 Deficiency of other specified B group vitamins: Secondary | ICD-10-CM

## 2021-02-06 NOTE — Progress Notes (Incomplete)
Patient Care Team: [name], MD as PCP - General Angelena Form Annita Brod, MD as PCP - Cardiology (Cardiology)  DIAGNOSIS: No diagnosis found.  CHIEF COMPLIANT: Follow-up of iron deficiency and B-12 deficiency  INTERVAL HISTORY: [name] is a 60 y.o. with above-mentioned history of gastric bypass surgeryfollowed byiron deficiency and B12 deficiency. She is currently onweeklyB12 injections.She presents to the clinicalone todayfor follow-up  ALLERGIES:  is allergic to [employer] tartrate], food, latex, ramelteon, and tetracyclines & related.  MEDICATIONS:  Current Outpatient Medications  Medication Sig Dispense Refill  . cyanocobalamin (,VITAMIN B-12,) 1000 MCG/ML injection INJECT 1 ML (1,000 MCG TOTAL) INTO THE MUSCLE EVERY 30 (THIRTY) DAYS. 3 mL 13  . cyclobenzaprine (FLEXERIL) 10 MG tablet TAKE 2 TABLETS (20 MG TOTAL) BY MOUTH AT BEDTIME. 60 tablet 5  . diphenhydramine-acetaminophen (TYLENOL PM) 25-500 MG TABS tablet Take 2 tablets by mouth at bedtime.    Marland Kitchen EPINEPHRINE 0.3 mg/0.3 mL IJ SOAJ injection INJECT 0.3 MLS (0.3 MG TOTAL) INTO THE MUSCLE ONCE FOR 1 DOSE. 2 each 1  . ergocalciferol (VITAMIN D2) 1.25 MG (50000 UT) capsule Take 1 capsule (50,000 Units total) by mouth 2 (two) times a week. 30 capsule 1  . estradiol (ESTRACE) 0.1 MG/GM vaginal cream Place 1 Applicatorful vaginally 3 (three) times a week. 42.5 g 12  . FLUoxetine (PROZAC) 20 MG capsule TAKE 1 CAPSULE BY MOUTH EVERY DAY 90 capsule 2  . folic acid (FOLVITE) 1 MG tablet TAKE 1 TABLET BY MOUTH EVERY DAY 90 tablet 3  . hydrocortisone (ANUSOL-HC) 2.5 % rectal cream Place 1 application rectally at bedtime. 30 g 1  . levothyroxine (SYNTHROID) 112 MCG tablet Take 2 tablets (224 mcg total) by mouth daily before breakfast. 60 tablet 3  . methylcellulose oral powder Take 1 packet by mouth daily as needed (constipation).     . Multiple Vitamin (MULTIVITAMIN WITH MINERALS) TABS tablet Take 1 tablet by  mouth at bedtime.    . Needles & Syringes MISC 1 Syringe by Does not apply route every 30 (thirty) days. 12 Syringe 3  . omeprazole (PRILOSEC) 20 MG capsule Take 1 capsule (20 mg total) by mouth daily. (Patient taking differently: Take 20 mg by mouth at bedtime.) 90 capsule 3  . prochlorperazine (COMPAZINE) 5 MG tablet Take 1 tablet (5 mg total) by mouth every 6 (six) hours as needed for nausea or vomiting. 30 tablet 0   No current facility-administered medications for this visit.    PHYSICAL EXAMINATION: ECOG PERFORMANCE STATUS: {CHL ONC ECOG PS:[id number]}  There were no vitals filed for this visit. There were no vitals filed for this visit.   LABORATORY DATA:  I have reviewed the data as listed CMP Latest Ref Rng & Units 02/20/2020 01/05/2020 07/18/2019  Glucose 70 - 99 mg/dL - 86 101(H)  BUN 6 - 23 mg/dL - 8 4(L)  Creatinine 0.40 - 1.20 mg/dL - 0.75 0.50  Sodium 135 - 145 mEq/L - 139 141  Potassium 3.5 - 5.1 mEq/L - 3.5 3.4(L)  Chloride 96 - 112 mEq/L - 102 103  CO2 19 - 32 mEq/L - 28 32  Calcium 8.7 - 10.2 mg/dL 7.8(L) 7.6(L) 7.9(L)  Total Protein 6.0 - 8.3 g/dL - 6.8 6.4  Total Bilirubin 0.2 - 1.2 mg/dL - 1.0 1.2  Alkaline Phos 39 - 117 U/L - 155(H) 144(H)  AST 0 - 37 U/L - 16 14  ALT 0 - 35 U/L - 14 13    Lab Results  Component Value Date   WBC 6.0 01/30/2020   HGB 12.9 01/30/2020   HCT 38.8 01/30/2020   MCV 99.0 01/30/2020   PLT 214 01/30/2020   NEUTROABS 2.9 01/30/2020    ASSESSMENT & PLAN:  No problem-specific Assessment & Plan notes found for this encounter.    No orders of the defined types were placed in this encounter.  The patient has a good understanding of the overall plan. she agrees with it. she will call with any problems that may develop before the next visit here.  Total time spent: *** mins including face to face time and time spent for planning, charting and coordination of care  Rulon Eisenmenger, MD, MPH 02/06/2021  I, Molly Dorshimer, am  acting as scribe for Dr. Nicholas Lose.  {insert scribe attestation}

## 2021-02-07 ENCOUNTER — Encounter: Payer: Self-pay | Admitting: Hematology and Oncology

## 2021-02-07 ENCOUNTER — Inpatient Hospital Stay: Payer: Medicare Other

## 2021-02-07 ENCOUNTER — Inpatient Hospital Stay: Payer: Medicare Other | Admitting: Hematology and Oncology

## 2021-02-07 NOTE — Assessment & Plan Note (Deleted)
Due to gastric bypass surgery.  B12 levels May 2019: 98 B12 level October 2019: 68 B12 01/18/2019: 130 B12: 03/15/2019: 359  Treatment summary: We administered monthly B12 injections in the cancer center.  She is now able to do them at home.  Folic acid deficiency: Over-the-counter folic acid supplementation  Prior history of 3 episodes of DVT: Took anticoagulation 1996 2006, has an IVC filter Return to clinic once a year for labs and follow-up.  He can do labs ahead of time and follow-up with a MyChart virtual visit.

## 2021-02-07 NOTE — Assessment & Plan Note (Deleted)
Iron deficiency anemia also due to gastric bypass surgery: IV iron given July 2019, surprisingly iron levels are stable and does not require IV iron therapy.

## 2021-02-10 ENCOUNTER — Telehealth: Payer: Self-pay | Admitting: Hematology and Oncology

## 2021-02-10 NOTE — Telephone Encounter (Signed)
Called pt per 2/18 sch msg :   Entered by Heinz Knuckles L on 02/07/2021 at 9:25 AM Priority: Routine <No visit type provided>  Department: [facility]  Provider:   Appointment Notes:  pt sent mychart message requesing to cancel apts for today. lab and MD gudena. I have canceled apts, please re schedule to next available for lab and MD. please call pt to confirm date and time of apt. pt will need lab 1 to 2 days prior to MD visit.     Unable to reach pt .left message for patient to call back to schedule appt.

## 2021-02-16 NOTE — Assessment & Plan Note (Signed)
Iron deficiency anemia also due to gastric bypass surgery: IV iron given July 2019, surprisingly iron levels are stable and does not require IV iron therapy.  Vitamin B 12 deficiency Due to gastric bypass surgery.  B12 levels May 2019: 98 B12 level October 2019: 68 B12 01/18/2019: 130 B12: 03/15/2019: 359  Treatment summary: B12 weekly x4 started 09/21/2018, then every 2 weeks x4, now monthly We will try to send a prescription and see if it gets filled so that she can do it at home  Severe Hypothyroidism: PCP working on adjusting her meds Folic acid deficiency: Over-the-counter folic acid supplementation  Prior history of 3 episodes of DVT: Took anticoagulation 1996 2006, has an IVC filter Return to clinic monthly for B12 injections and every 12 months for follow-up with me.

## 2021-02-16 NOTE — Progress Notes (Signed)
Patient Care Team: [name], MD as PCP - General Angelena Form Annita Brod, MD as PCP - Cardiology (Cardiology)  DIAGNOSIS:    ICD-10-CM   1. Other iron deficiency anemia  D50.8     CHIEF COMPLIANT: Follow-up of iron deficiency and B-12 deficiency  INTERVAL HISTORY: [name] is a 60 y.o. with above-mentioned history of gastric bypass surgeryfollowed byiron deficiency and B12 deficiency. She is currently onweeklyB12 injections.She presents to the clinicalone todayfor follow-up  ALLERGIES:  is allergic to [employer] tartrate], food, latex, ramelteon, and tetracyclines & related.  MEDICATIONS:  Current Outpatient Medications  Medication Sig Dispense Refill  . cyanocobalamin (,VITAMIN B-12,) 1000 MCG/ML injection INJECT 1 ML (1,000 MCG TOTAL) INTO THE MUSCLE EVERY 30 (THIRTY) DAYS. 3 mL 13  . cyclobenzaprine (FLEXERIL) 10 MG tablet TAKE 2 TABLETS (20 MG TOTAL) BY MOUTH AT BEDTIME. 60 tablet 5  . diphenhydramine-acetaminophen (TYLENOL PM) 25-500 MG TABS tablet Take 2 tablets by mouth at bedtime.    Marland Kitchen EPINEPHRINE 0.3 mg/0.3 mL IJ SOAJ injection INJECT 0.3 MLS (0.3 MG TOTAL) INTO THE MUSCLE ONCE FOR 1 DOSE. 2 each 1  . ergocalciferol (VITAMIN D2) 1.25 MG (50000 UT) capsule Take 1 capsule (50,000 Units total) by mouth 2 (two) times a week. 30 capsule 1  . estradiol (ESTRACE) 0.1 MG/GM vaginal cream Place 1 Applicatorful vaginally 3 (three) times a week. 42.5 g 12  . FLUoxetine (PROZAC) 20 MG capsule TAKE 1 CAPSULE BY MOUTH EVERY DAY 90 capsule 2  . folic acid (FOLVITE) 1 MG tablet TAKE 1 TABLET BY MOUTH EVERY DAY 90 tablet 3  . hydrocortisone (ANUSOL-HC) 2.5 % rectal cream Place 1 application rectally at bedtime. 30 g 1  . levothyroxine (SYNTHROID) 112 MCG tablet Take 2 tablets (224 mcg total) by mouth daily before breakfast. 60 tablet 3  . methylcellulose oral powder Take 1 packet by mouth daily as needed (constipation).     . Multiple Vitamin (MULTIVITAMIN WITH  MINERALS) TABS tablet Take 1 tablet by mouth at bedtime.    . Needles & Syringes MISC 1 Syringe by Does not apply route every 30 (thirty) days. 12 Syringe 3  . omeprazole (PRILOSEC) 20 MG capsule Take 1 capsule (20 mg total) by mouth daily. (Patient taking differently: Take 20 mg by mouth at bedtime.) 90 capsule 3  . prochlorperazine (COMPAZINE) 5 MG tablet Take 1 tablet (5 mg total) by mouth every 6 (six) hours as needed for nausea or vomiting. 30 tablet 0   No current facility-administered medications for this visit.    PHYSICAL EXAMINATION: ECOG PERFORMANCE STATUS: 1 - Symptomatic but completely ambulatory  Vitals:   02/17/21 1527  BP: 129/77  Pulse: (!) 55  Resp: 18  Temp: 97.7 F (36.5 C)  SpO2: 97%   Filed Weights   02/17/21 1527  Weight: (!) 349 lb 1.6 oz (158.4 kg)     LABORATORY DATA:  I have reviewed the data as listed CMP Latest Ref Rng & Units 02/20/2020 01/05/2020 07/18/2019  Glucose 70 - 99 mg/dL - 86 101(H)  BUN 6 - 23 mg/dL - 8 4(L)  Creatinine 0.40 - 1.20 mg/dL - 0.75 0.50  Sodium 135 - 145 mEq/L - 139 141  Potassium 3.5 - 5.1 mEq/L - 3.5 3.4(L)  Chloride 96 - 112 mEq/L - 102 103  CO2 19 - 32 mEq/L - 28 32  Calcium 8.7 - 10.2 mg/dL 7.8(L) 7.6(L) 7.9(L)  Total Protein 6.0 - 8.3 g/dL - 6.8 6.4  Total  Bilirubin 0.2 - 1.2 mg/dL - 1.0 1.2  Alkaline Phos 39 - 117 U/L - 155(H) 144(H)  AST 0 - 37 U/L - 16 14  ALT 0 - 35 U/L - 14 13    Lab Results  Component Value Date   WBC 6.7 02/17/2021   HGB 12.5 02/17/2021   HCT 39.0 02/17/2021   MCV 102.4 (H) 02/17/2021   PLT 201 02/17/2021   NEUTROABS 3.5 02/17/2021    ASSESSMENT & PLAN:  Iron deficiency anemia Iron deficiency anemia also due to gastric bypass surgery: IV iron given July 2019, surprisingly iron levels are stable and does not require IV iron therapy.  For the weight gain, she is thinking of going to see a surgeon at [facility].  Vitamin B 12 deficiency Due to gastric bypass surgery.  B12  levels May 2019: 98 B12 level October 2019: 68 B12 01/18/2019: 130 B12: 03/15/2019: 359  Lab review: 02/17/2021 hemoglobin 12.5, MCV 102.4  Treatment summary: B12 weekly x4 started 09/21/2018, then every 2 weeks x4, now monthly She is not doing B12 injections at home.  Severe Hypothyroidism: PCP working on adjusting her meds Folic acid deficiency: Over-the-counter folic acid supplementation  Prior history of 3 episodes of DVT: Took anticoagulation 1996 2006, has an IVC filter Return to clinic  every 12 months for follow-up with me.    No orders of the defined types were placed in this encounter.  The patient has a good understanding of the overall plan. she agrees with it. she will call with any problems that may develop before the next visit here.  Total time spent: 20 mins including face to face time and time spent for planning, charting and coordination of care  Rulon Eisenmenger, MD, MPH 02/17/2021  I, Cloyde Reams Dorshimer, am acting as scribe for Dr. Nicholas Lose.  I have reviewed the above documentation for accuracy and completeness, and I agree with the above.

## 2021-02-17 ENCOUNTER — Other Ambulatory Visit: Payer: Self-pay

## 2021-02-17 ENCOUNTER — Inpatient Hospital Stay: Payer: Medicare Other | Attending: Hematology and Oncology

## 2021-02-17 ENCOUNTER — Inpatient Hospital Stay (HOSPITAL_BASED_OUTPATIENT_CLINIC_OR_DEPARTMENT_OTHER): Payer: Medicare Other | Admitting: Hematology and Oncology

## 2021-02-17 DIAGNOSIS — D508 Other iron deficiency anemias: Secondary | ICD-10-CM

## 2021-02-17 DIAGNOSIS — Z9884 Bariatric surgery status: Secondary | ICD-10-CM | POA: Diagnosis not present

## 2021-02-17 DIAGNOSIS — Z79899 Other long term (current) drug therapy: Secondary | ICD-10-CM | POA: Diagnosis not present

## 2021-02-17 DIAGNOSIS — Z888 Allergy status to other drugs, medicaments and biological substances status: Secondary | ICD-10-CM | POA: Insufficient documentation

## 2021-02-17 DIAGNOSIS — E039 Hypothyroidism, unspecified: Secondary | ICD-10-CM | POA: Insufficient documentation

## 2021-02-17 DIAGNOSIS — E538 Deficiency of other specified B group vitamins: Secondary | ICD-10-CM | POA: Diagnosis not present

## 2021-02-17 LAB — CBC WITH DIFFERENTIAL (CANCER CENTER ONLY)
Abs Immature Granulocytes: 0.01 10*3/uL (ref 0.00–0.07)
Basophils Absolute: 0.1 10*3/uL (ref 0.0–0.1)
Basophils Relative: 1 %
Eosinophils Absolute: 0.2 10*3/uL (ref 0.0–0.5)
Eosinophils Relative: 3 %
HCT: 39 % (ref 36.0–46.0)
Hemoglobin: 12.5 g/dL (ref 12.0–15.0)
Immature Granulocytes: 0 %
Lymphocytes Relative: 37 %
Lymphs Abs: 2.5 10*3/uL (ref 0.7–4.0)
MCH: 32.8 pg (ref 26.0–34.0)
MCHC: 32.1 g/dL (ref 30.0–36.0)
MCV: 102.4 fL — ABNORMAL HIGH (ref 80.0–100.0)
Monocytes Absolute: 0.5 10*3/uL (ref 0.1–1.0)
Monocytes Relative: 7 %
Neutro Abs: 3.5 10*3/uL (ref 1.7–7.7)
Neutrophils Relative %: 52 %
Platelet Count: 201 10*3/uL (ref 150–400)
RBC: 3.81 MIL/uL — ABNORMAL LOW (ref 3.87–5.11)
RDW: 13.6 % (ref 11.5–15.5)
WBC Count: 6.7 10*3/uL (ref 4.0–10.5)
nRBC: 0 % (ref 0.0–0.2)

## 2021-02-17 LAB — VITAMIN B12: Vitamin B-12: 183 pg/mL (ref 180–914)

## 2021-02-18 ENCOUNTER — Telehealth: Payer: Self-pay | Admitting: Hematology and Oncology

## 2021-02-18 LAB — IRON AND TIBC
Iron: 67 ug/dL (ref 41–142)
Saturation Ratios: 17 % — ABNORMAL LOW (ref 21–57)
TIBC: 404 ug/dL (ref 236–444)
UIBC: 337 ug/dL (ref 120–384)

## 2021-02-18 LAB — FERRITIN: Ferritin: 28 ng/mL (ref 11–307)

## 2021-02-18 NOTE — Telephone Encounter (Signed)
Scheduled per 2/28 los. Called and spoke with pt, confirmed 2/28 appts

## 2021-03-18 MED ORDER — FLUOXETINE HCL 20 MG PO CAPS: 40.0000 mg | ORAL_CAPSULE | Freq: Every day | ORAL | 3 refills | Status: DC

## 2021-04-08 ENCOUNTER — Other Ambulatory Visit: Payer: Self-pay

## 2021-04-08 ENCOUNTER — Ambulatory Visit (INDEPENDENT_AMBULATORY_CARE_PROVIDER_SITE_OTHER): Payer: Medicare Other | Admitting: Internal Medicine

## 2021-04-08 ENCOUNTER — Encounter: Payer: Self-pay | Admitting: Internal Medicine

## 2021-04-08 VITALS — BP 122/78 | HR 52 | Temp 97.3°F | Ht 65.0 in | Wt 345.0 lb

## 2021-04-08 DIAGNOSIS — E039 Hypothyroidism, unspecified: Secondary | ICD-10-CM

## 2021-04-08 DIAGNOSIS — K219 Gastro-esophageal reflux disease without esophagitis: Secondary | ICD-10-CM | POA: Diagnosis not present

## 2021-04-08 DIAGNOSIS — Z8669 Personal history of other diseases of the nervous system and sense organs: Secondary | ICD-10-CM | POA: Diagnosis not present

## 2021-04-08 DIAGNOSIS — F320 Major depressive disorder, single episode, mild: Secondary | ICD-10-CM

## 2021-04-08 DIAGNOSIS — Z0000 Encounter for general adult medical examination without abnormal findings: Secondary | ICD-10-CM

## 2021-04-08 LAB — COMPREHENSIVE METABOLIC PANEL
ALT: 31 U/L (ref 0–35)
AST: 33 U/L (ref 0–37)
Albumin: 3.8 g/dL (ref 3.5–5.2)
Alkaline Phosphatase: 104 U/L (ref 39–117)
BUN: 9 mg/dL (ref 6–23)
CO2: 30 mEq/L (ref 19–32)
Calcium: 8 mg/dL — ABNORMAL LOW (ref 8.4–10.5)
Chloride: 105 mEq/L (ref 96–112)
Creatinine, Ser: 0.69 mg/dL (ref 0.40–1.20)
GFR: 94.68 mL/min (ref >60.00)
Glucose, Bld: 88 mg/dL (ref 70–99)
Potassium: 4 mEq/L (ref 3.5–5.1)
Sodium: 142 mEq/L (ref 135–145)
Total Bilirubin: 1.2 mg/dL (ref 0.2–1.2)
Total Protein: 6.3 g/dL (ref 6.0–8.3)

## 2021-04-08 LAB — TSH: TSH: 57.35 u[IU]/mL — ABNORMAL HIGH (ref 0.35–4.50)

## 2021-04-08 LAB — T4, FREE: Free T4: 0.3 ng/dL — ABNORMAL LOW (ref 0.60–1.60)

## 2021-04-08 MED ORDER — BUPROPION HCL ER (SR) 150 MG PO TB12: 150.0000 mg | ORAL_TABLET | Freq: Two times a day (BID) | ORAL | 3 refills | Status: DC

## 2021-04-08 MED ORDER — LEVOTHYROXINE SODIUM 112 MCG PO TABS: 224.0000 ug | ORAL_TABLET | Freq: Every day | ORAL | 3 refills | Status: AC

## 2021-04-08 NOTE — Assessment & Plan Note (Signed)
Continues on the omeprazole

## 2021-04-08 NOTE — Assessment & Plan Note (Signed)
Seems to be okay with levothyroxine No nodules now

## 2021-04-08 NOTE — Assessment & Plan Note (Signed)
I have personally reviewed the Medicare Annual Wellness questionnaire and have noted 1. The patient's medical and social history 2. Their use of alcohol, tobacco or illicit drugs 3. Their current medications and supplements 4. The patient's functional ability including ADL's, fall risks, home safety risks and hearing or visual             impairment. 5. Diet and physical activities 6. Evidence for depression or mood disorders  The patients weight, height, BMI and visual acuity have been recorded in the chart I have made referrals, counseling and provided education to the patient based review of the above and I have provided the pt with a written personalized care plan for preventive services.  I have provided you with a copy of your personalized plan for preventive services. Please take the time to review along with your updated medication list.  Colon due in 2024 Still hasn't done the mammogram COVID booster and flu vaccine in the fall Will try to get into a pool to exercise No pap due to hyster

## 2021-04-08 NOTE — Patient Instructions (Signed)
Please set up your screening mammogram. 

## 2021-04-08 NOTE — Assessment & Plan Note (Signed)
No problems since the surgery---occ headache

## 2021-04-08 NOTE — Progress Notes (Signed)
Subjective:    Patient ID: [name], female    DOB: 03-31-1961, 60 y.o.   MRN: [id number]  HPI Here for Medicare wellness visit and follow up of chronic health conditions This visit occurred during the SARS-CoV-2 public health emergency.  Safety protocols were in place, including screening questions prior to the visit, additional usage of staff PPE, and extensive cleaning of exam room while observing appropriate contact time as indicated for disinfecting solutions.   Reviewed advanced directives Reviewed other doctors-----Dr Gudena--hematology, Dr Dingledein--ophthal, Dr Elwyn Reach, Dr McAlhany--cardiology No hospitalizations or surgery in the past year Vision and hearing are okay No tobacco. Rare alcohol No falls Chronic depression Independent with instrumental ADLs No memory issues  Ongoing depression Tried increasing the fluoxetine--- 40/20 every other day.  Didn't make any difference Just feels "blah" Some degree of anhedonia Still working part time---stress from a coworker (but they just quit)---so may help some Has seen counselor in the past--during divorce.  No problems with the thyroid No abnormal areas on ultrasound last time---no longer seeing Dr Loanne Drilling  Still not able to exercise--looking into pool therapy Weight is about the same Will see Dr Florene Glen at [facility] about revising her bariatric surgery  Legs really swell by the end of the day  Continues on the PPI Will get some reflux depending on diet No dysphagia  Does her own B12 injections  Current Outpatient Medications on File Prior to Visit  Medication Sig Dispense Refill  . cyanocobalamin (,VITAMIN B-12,) 1000 MCG/ML injection INJECT 1 ML (1,000 MCG TOTAL) INTO THE MUSCLE EVERY 30 (THIRTY) DAYS. 3 mL 13  . cyclobenzaprine (FLEXERIL) 10 MG tablet TAKE 2 TABLETS (20 MG TOTAL) BY MOUTH AT BEDTIME. 60 tablet 5  . diphenhydramine-acetaminophen (TYLENOL PM) 25-500 MG TABS tablet Take 2 tablets by  mouth at bedtime.    Marland Kitchen EPINEPHRINE 0.3 mg/0.3 mL IJ SOAJ injection INJECT 0.3 MLS (0.3 MG TOTAL) INTO THE MUSCLE ONCE FOR 1 DOSE. 2 each 1  . ergocalciferol (VITAMIN D2) 1.25 MG (50000 UT) capsule Take 1 capsule (50,000 Units total) by mouth 2 (two) times a week. 30 capsule 1  . estradiol (ESTRACE) 0.1 MG/GM vaginal cream Place 1 Applicatorful vaginally 3 (three) times a week. 42.5 g 12  . FLUoxetine (PROZAC) 20 MG capsule Take 2 capsules (40 mg total) by mouth daily. On odd days, 20mg  on even days. 630 capsule 3  . folic acid (FOLVITE) 1 MG tablet TAKE 1 TABLET BY MOUTH EVERY DAY 90 tablet 3  . hydrocortisone (ANUSOL-HC) 2.5 % rectal cream Place 1 application rectally at bedtime. 30 g 1  . levothyroxine (SYNTHROID) 112 MCG tablet Take 2 tablets (224 mcg total) by mouth daily before breakfast. 60 tablet 3  . methylcellulose oral powder Take 1 packet by mouth daily as needed (constipation).     . Multiple Vitamin (MULTIVITAMIN WITH MINERALS) TABS tablet Take 1 tablet by mouth at bedtime.    . Needles & Syringes MISC 1 Syringe by Does not apply route every 30 (thirty) days. 12 Syringe 3  . omeprazole (PRILOSEC) 20 MG capsule Take 1 capsule (20 mg total) by mouth daily. (Patient taking differently: Take 20 mg by mouth at bedtime.) 90 capsule 3  . prochlorperazine (COMPAZINE) 5 MG tablet Take 1 tablet (5 mg total) by mouth every 6 (six) hours as needed for nausea or vomiting. 30 tablet 0   No current facility-administered medications on file prior to visit.    Allergies  Allergen Reactions  .  Ambien [Zolpidem Tartrate]     [employer]  . Food Anaphylaxis, Diarrhea and Nausea And Vomiting    Bananas-anaphylactic Kiwi-anaphylactic Mushroom--nausea/vomiting Beer- anaphylactic  . Latex Other (See Comments)    REACTION: "anaphylactic shock"  . Ramelteon Other (See Comments)    REACTION: blacks out ([place])  . Tetracyclines & Related Anaphylaxis and Other (See Comments)    Syncope     Past  Medical History:  Diagnosis Date  . Allergy   . Anemia   . Anxiety   . Chronic venous insufficiency   . Depression   . Family history of adverse reaction to anesthesia   . GERD (gastroesophageal reflux disease)   . [place] filter in place   . History of DVT (deep vein thrombosis)   . Hypothyroidism   . Thyroid cancer ([place]) dx'd 2008   surg only    Past Surgical History:  Procedure Laterality Date  . ABDOMINAL HYSTERECTOMY    . BALLOON DILATION N/A 12/08/2018   Procedure: BALLOON DILATION;  Surgeon: Milus Banister, MD;  Location: [facility];  Service: Endoscopy;  Laterality: N/A;  . BIOPSY THYROID  01/03   suggestive of Hashimoto's  . CESAREAN SECTION  1998  . CHOLECYSTECTOMY  1982  . COLONOSCOPY WITH PROPOFOL N/A 12/08/2018   Procedure: COLONOSCOPY WITH PROPOFOL;  Surgeon: Milus Banister, MD;  Location: [facility];  Service: Endoscopy;  Laterality: N/A;  . CRANIECTOMY SUBOCCIPITAL W/ CERVICAL LAMINECTOMY / CHIARI  05/2010   repair, Dr.Pool  . DILATION AND CURETTAGE OF UTERUS  2003  . ESOPHAGOGASTRODUODENOSCOPY (EGD) WITH PROPOFOL N/A 12/08/2018   Procedure: ESOPHAGOGASTRODUODENOSCOPY (EGD) WITH PROPOFOL;  Surgeon: Milus Banister, MD;  Location: [facility];  Service: Endoscopy;  Laterality: N/A;  . FETAL SURGERY FOR CONGENITAL HERNIA    . GASTRIC BYPASS    . POLYPECTOMY  12/08/2018   Procedure: POLYPECTOMY;  Surgeon: Milus Banister, MD;  Location: [facility];  Service: Endoscopy;;  . THYROIDECTOMY  2008   left  . TONSILLECTOMY  1965  . TUBAL LIGATION  1998  . VEIN LIGATION  1995/1998   right leg    Family History  Problem Relation Age of Onset  . Arthritis Mother   . Coronary artery disease Father   . Kidney cancer Father   . Lung cancer Father   . Coronary artery disease Sister   . Ovarian cancer Maternal Grandmother   . Thyroid disease Neg Hx   . Colon cancer Neg Hx   . Esophageal cancer Neg Hx   . Pancreatic cancer Neg Hx   . Stomach cancer  Neg Hx     Social History   Socioeconomic History  . Marital status: Divorced    Spouse name: Not on file  . Number of children: 3  . Years of education: Not on file  . Highest education level: Not on file  Occupational History  . Occupation: Part time Environmental manager  Tobacco Use  . Smoking status: Never Smoker  . Smokeless tobacco: Never Used  Substance and Sexual Activity  . Alcohol use: Yes    Alcohol/week: 0.0 standard drinks    Comment: rare  . Drug use: No  . Sexual activity: Not on file  Other Topics Concern  . Not on file  Social History Narrative   No living will   Requests mother to make health care decision--alternate would sons Marylyn Ishihara or Alroy Dust   Would accept resuscitation attempts   No tube feeds if cognitively unaware  Social Determinants of Health   Financial Resource Strain: Not on file  Food Insecurity: Not on file  Transportation Needs: Not on file  Physical Activity: Not on file  Stress: Not on file  Social Connections: Not on file  Intimate Partner Violence: Not on file   Review of Systems Appetite is stable Weight about the same Still doesn't sleep well--despite tylenol PM. Didn't like melatonin No teeth--so no dentist Wears seat belt No suspicious skin lesions No chest pain or SOB (other than DOE with weight) No dizziness or syncope Rare palpitations ---but brief. Gets tingly feeling across chest at times Bowels are slow at times--uses miralax prn No dysuria or hematuria. Not using the estrogen cream regularly No sig back or joint pains----does get back pain at times    Objective:   Physical Exam Constitutional:      Appearance: She is obese.  HENT:     Mouth/Throat:     Comments: No lesions Eyes:     Conjunctiva/sclera: Conjunctivae normal.     Pupils: Pupils are equal, round, and reactive to light.  Cardiovascular:     Rate and Rhythm: Normal rate and regular rhythm.     Pulses: Normal pulses.     Heart sounds: No  murmur heard. No gallop.   Pulmonary:     Effort: Pulmonary effort is normal.     Breath sounds: Normal breath sounds. No wheezing or rales.  Abdominal:     Palpations: Abdomen is soft.     Tenderness: There is no abdominal tenderness.  Musculoskeletal:     Cervical back: Neck supple.     Comments: Thick calves without pitting Slight pedal edema Significant varicosities (can't tolerate hose for long)  Lymphadenopathy:     Cervical: No cervical adenopathy.  Skin:    General: Skin is warm.     Findings: No rash.  Neurological:     Mental Status: She is alert and oriented to person, place, and time.     Comments: President---" Zoila Shutter, Obama" [id number] Recall 3/3  Psychiatric:        Mood and Affect: Mood normal.        Behavior: Behavior normal.            Assessment & Plan:

## 2021-04-08 NOTE — Assessment & Plan Note (Signed)
Persistent symptoms despite the increased fluoxetine Discussed trying another counselor Will add bupropion

## 2021-04-08 NOTE — Assessment & Plan Note (Signed)
Is going to see a new surgeon about revising her surgery

## 2021-04-08 NOTE — Progress Notes (Signed)
Hearing Screening   Method: Audiometry   125Hz  250Hz  500Hz  1000Hz  2000Hz  3000Hz  4000Hz  6000Hz  8000Hz   Right ear:   20 20 20  20     Left ear:   20 20 20  20     Vision Screening Comments: October 2021

## 2021-04-09 ENCOUNTER — Other Ambulatory Visit: Payer: Self-pay | Admitting: Internal Medicine

## 2021-04-09 DIAGNOSIS — E039 Hypothyroidism, unspecified: Secondary | ICD-10-CM

## 2021-05-03 ENCOUNTER — Other Ambulatory Visit: Payer: Self-pay | Admitting: Internal Medicine

## 2021-05-07 ENCOUNTER — Emergency Department (HOSPITAL_COMMUNITY)
Admission: EM | Admit: 2021-05-07 | Discharge: 2021-05-07 | Disposition: A | Discharge status: Home / Self Care | Payer: Medicare Other | Attending: Emergency Medicine | Admitting: Emergency Medicine

## 2021-05-07 ENCOUNTER — Encounter (HOSPITAL_COMMUNITY): Payer: Self-pay

## 2021-05-07 DIAGNOSIS — T7840XA Allergy, unspecified, initial encounter: Secondary | ICD-10-CM | POA: Diagnosis not present

## 2021-05-07 DIAGNOSIS — T782XXA Anaphylactic shock, unspecified, initial encounter: Secondary | ICD-10-CM | POA: Diagnosis not present

## 2021-05-07 DIAGNOSIS — E039 Hypothyroidism, unspecified: Secondary | ICD-10-CM | POA: Insufficient documentation

## 2021-05-07 DIAGNOSIS — X58XXXA Exposure to other specified factors, initial encounter: Secondary | ICD-10-CM | POA: Diagnosis not present

## 2021-05-07 DIAGNOSIS — Z91040 Latex allergy status: Secondary | ICD-10-CM | POA: Diagnosis not present

## 2021-05-07 DIAGNOSIS — R0600 Dyspnea, unspecified: Secondary | ICD-10-CM | POA: Diagnosis not present

## 2021-05-07 DIAGNOSIS — Z85850 Personal history of malignant neoplasm of thyroid: Secondary | ICD-10-CM | POA: Diagnosis not present

## 2021-05-07 DIAGNOSIS — Z79899 Other long term (current) drug therapy: Secondary | ICD-10-CM | POA: Diagnosis not present

## 2021-05-07 DIAGNOSIS — J80 Acute respiratory distress syndrome: Secondary | ICD-10-CM | POA: Diagnosis not present

## 2021-05-07 DIAGNOSIS — L509 Urticaria, unspecified: Secondary | ICD-10-CM | POA: Diagnosis not present

## 2021-05-07 MED ORDER — EPINEPHRINE 0.3 MG/0.3ML IJ SOAJ: 0.3000 mg | INTRAMUSCULAR | 0 refills | Status: AC | PRN

## 2021-05-07 MED ORDER — PREDNISONE 10 MG PO TABS: 40.0000 mg | ORAL_TABLET | Freq: Every day | ORAL | 0 refills | Status: AC

## 2021-05-07 MED ORDER — PREDNISONE 20 MG PO TABS
60.0000 mg | ORAL_TABLET | Freq: Once | ORAL | Status: AC
  Administered 2021-05-07: 60 mg via ORAL
  Filled 2021-05-07: qty 3

## 2021-05-07 NOTE — ED Provider Notes (Signed)
[name] Provider Note   CSN: [id number] Arrival date & time: 05/07/21  0957     History Chief Complaint  Patient presents with  . Allergic Reaction    [name] is a 60 y.o. female.  60 year old female with prior medical history as detailed low presents for evaluation.  Patient reports allergic sensitivity to latex and rubber.  She reports that a pair of shoes was placed near her on a counter.  This triggered allergic symptoms.  She reports onset of rash to the face and difficulty breathing.  She administered epi.  She took 4 Benadryl.  She then called EMS.  EMS reports that the patient is now back to her baseline without acute symptoms.  Patient appears to be comfortable.  She denies significant shortness of breath.  She denies chest pain.  The history is provided by the patient and medical records.  Allergic Reaction Presenting symptoms: difficulty breathing   Severity:  Mild Duration:  1 hour Prior allergic episodes:  No prior episodes Relieved by:  Nothing Worsened by:  Nothing Ineffective treatments:  Epinephrine      Past Medical History:  Diagnosis Date  . Allergy   . Anemia   . Anxiety   . Chronic venous insufficiency   . Depression   . Family history of adverse reaction to anesthesia   . GERD (gastroesophageal reflux disease)   . Greenfield filter in place   . History of DVT (deep vein thrombosis)   . Hypothyroidism   . Thyroid cancer ([place]) dx'd 2008   surg only    Patient Active Problem List   Diagnosis Date Noted  . MDD (major depressive disorder), single episode, mild ([place]) 12/24/2020  . Hypocalcemia 02/20/2020  . Mood disorder ([place]) 01/05/2020  . Polyp of cecum   . Polyp of ascending colon   . Hemorrhoids   . Benign esophageal stricture   . Vitamin B 12 deficiency 03/15/2015  . Left thyroid nodule 07/31/2014  . Routine general medical examination at a health care facility 07/02/2011  . Lumbar  pain with radiation down right leg 02/02/2011  . History of Chiari malformation 09/25/2009  . Chronic venous insufficiency 08/27/2009  . Obesity, morbid, BMI 50 or higher ([place]) 06/26/2008  . Sleep disturbance 03/27/2008  . ALLERGIC RHINITIS 09/02/2007  . Hypothyroidism 02/18/2007  . Iron deficiency anemia 02/18/2007  . GERD 02/18/2007  . DVT, HX OF 02/18/2007  . COLONIC POLYPS 10/21/2005    Past Surgical History:  Procedure Laterality Date  . ABDOMINAL HYSTERECTOMY    . BALLOON DILATION N/A 12/08/2018   Procedure: BALLOON DILATION;  Surgeon: Milus Banister, MD;  Location: [facility];  Service: Endoscopy;  Laterality: N/A;  . BIOPSY THYROID  01/03   suggestive of Hashimoto's  . CESAREAN SECTION  1998  . CHOLECYSTECTOMY  1982  . COLONOSCOPY WITH PROPOFOL N/A 12/08/2018   Procedure: COLONOSCOPY WITH PROPOFOL;  Surgeon: Milus Banister, MD;  Location: [facility];  Service: Endoscopy;  Laterality: N/A;  . CRANIECTOMY SUBOCCIPITAL W/ CERVICAL LAMINECTOMY / CHIARI  05/2010   repair, Dr.Pool  . DILATION AND CURETTAGE OF UTERUS  2003  . ESOPHAGOGASTRODUODENOSCOPY (EGD) WITH PROPOFOL N/A 12/08/2018   Procedure: ESOPHAGOGASTRODUODENOSCOPY (EGD) WITH PROPOFOL;  Surgeon: Milus Banister, MD;  Location: [facility];  Service: Endoscopy;  Laterality: N/A;  . FETAL SURGERY FOR CONGENITAL HERNIA    . GASTRIC BYPASS    . POLYPECTOMY  12/08/2018   Procedure: POLYPECTOMY;  Surgeon: Owens Loffler  P, MD;  Location: [facility];  Service: Endoscopy;;  . THYROIDECTOMY  2008   left  . TONSILLECTOMY  1965  . TUBAL LIGATION  1998  . VEIN LIGATION  1995/1998   right leg     OB History   No obstetric history on file.     Family History  Problem Relation Age of Onset  . Arthritis Mother   . Coronary artery disease Father   . Kidney cancer Father   . Lung cancer Father   . Coronary artery disease Sister   . Ovarian cancer Maternal Grandmother   . Thyroid disease Neg Hx   . Colon  cancer Neg Hx   . Esophageal cancer Neg Hx   . Pancreatic cancer Neg Hx   . Stomach cancer Neg Hx     Social History   Tobacco Use  . Smoking status: Never Smoker  . Smokeless tobacco: Never Used  Substance Use Topics  . Alcohol use: Yes    Alcohol/week: 0.0 standard drinks    Comment: rare  . Drug use: No    Home Medications Prior to Admission medications   Medication Sig Start Date End Date Taking? Authorizing Provider  buPROPion (WELLBUTRIN SR) 150 MG 12 hr tablet Take 1 tablet (150 mg total) by mouth 2 (two) times daily. 04/08/21   [name], MD  cyanocobalamin (,VITAMIN B-12,) 1000 MCG/ML injection INJECT 1 ML (1,000 MCG TOTAL) INTO THE MUSCLE EVERY 30 (THIRTY) DAYS. 02/05/21   Nicholas Lose, MD  cyclobenzaprine (FLEXERIL) 10 MG tablet TAKE 2 TABLETS (20 MG TOTAL) BY MOUTH AT BEDTIME. 02/03/20   [name], MD  diphenhydramine-acetaminophen (TYLENOL PM) 25-500 MG TABS tablet Take 2 tablets by mouth at bedtime.    [provider]  EPINEPHRINE 0.3 mg/0.3 mL IJ SOAJ injection INJECT 0.3 MLS (0.3 MG TOTAL) INTO THE MUSCLE ONCE FOR 1 DOSE. 07/01/20   [name], MD  ergocalciferol (VITAMIN D2) 1.25 MG (50000 UT) capsule Take 1 capsule (50,000 Units total) by mouth 2 (two) times a week. 02/22/20   Renato Shin, MD  estradiol (ESTRACE) 0.1 MG/GM vaginal cream Place 1 Applicatorful vaginally 3 (three) times a week. 11/06/20   [name], MD  FLUoxetine (PROZAC) 20 MG capsule Take 2 capsules (40 mg total) by mouth daily. On odd days, 20mg  on even days. 03/18/21   [name], MD  folic acid (FOLVITE) 1 MG tablet TAKE 1 TABLET BY MOUTH EVERY DAY 10/09/19   Nicholas Lose, MD  hydrocortisone (ANUSOL-HC) 2.5 % rectal cream Place 1 application rectally at bedtime. 04/17/20   Willia Craze, NP  levothyroxine (SYNTHROID) 112 MCG tablet Take 2 tablets (224 mcg total) by mouth daily before breakfast. 04/08/21   [name], MD  methylcellulose oral  powder Take 1 packet by mouth daily as needed (constipation).     [provider]  Multiple Vitamin (MULTIVITAMIN WITH MINERALS) TABS tablet Take 1 tablet by mouth at bedtime.    [provider]  Needles & Syringes MISC 1 Syringe by Does not apply route every 30 (thirty) days. 01/30/20   Nicholas Lose, MD  omeprazole (PRILOSEC) 20 MG capsule Take 1 capsule (20 mg total) by mouth daily. Patient taking differently: Take 20 mg by mouth at bedtime. 04/13/18   [name], MD  prochlorperazine (COMPAZINE) 5 MG tablet Take 1 tablet (5 mg total) by mouth every 6 (six) hours as needed for nausea or vomiting. 07/04/18   Harle Stanford.,  PA-C    Allergies    Ambien [zolpidem tartrate], Food, Latex, Ramelteon, and Tetracyclines & related  Review of Systems   Review of Systems  All other systems reviewed and are negative.   Physical Exam Updated Vital Signs BP 132/79 (BP Location: Right Arm)   Pulse 98   Temp (!) 97.5 F (36.4 C) (Oral)   Resp 19   SpO2 99%   Physical Exam Vitals and nursing note reviewed.  Constitutional:      General: She is not in acute distress.    Appearance: Normal appearance. She is well-developed.  HENT:     Head: Normocephalic and atraumatic.  Eyes:     Conjunctiva/sclera: Conjunctivae normal.     Pupils: Pupils are equal, round, and reactive to light.  Cardiovascular:     Rate and Rhythm: Normal rate and regular rhythm.     Heart sounds: Normal heart sounds.  Pulmonary:     Effort: Pulmonary effort is normal. No respiratory distress.     Breath sounds: Normal breath sounds.  Abdominal:     General: There is no distension.     Palpations: Abdomen is soft.     Tenderness: There is no abdominal tenderness.  Musculoskeletal:        General: No deformity. Normal range of motion.     Cervical back: Normal range of motion and neck supple.  Skin:    General: Skin is warm and dry.  Neurological:     Mental Status: She is alert and oriented  to person, place, and time.     ED Results / Procedures / Treatments   Labs (all labs ordered are listed, but only abnormal results are displayed) Labs Reviewed - No data to display  EKG None  Radiology No results found.  Procedures Procedures   Medications Ordered in ED Medications  predniSONE (DELTASONE) tablet 60 mg (has no administration in time range)    ED Course  I have reviewed the triage vital signs and the nursing notes.  Pertinent labs & imaging results that were available during my care of the patient were reviewed by me and considered in my medical decision making (see chart for details).    MDM Rules/Calculators/A&P                          MDM  MSE complete  [name] was evaluated in Emergency Department on 05/07/2021 for the symptoms described in the history of present illness. She was evaluated in the context of the global COVID-19 pandemic, which necessitated consideration that the patient might be at risk for infection with the SARS-CoV-2 virus that causes COVID-19. Institutional protocols and algorithms that pertain to the evaluation of patients at risk for COVID-19 are in a state of rapid change based on information released by regulatory bodies including the CDC and federal and state organizations. These policies and algorithms were followed during the patient's care in the ED.  Patient presented with possible allergic type reaction.  She was improved upon arrival to the ED.  She was observed for 2 hours given reported use of epinephrine prior to arrival.  No further symptoms were noted during her observation.  Patient was then discharged.  She understands need for close follow-up.  Strict return precautions given understood.  Final Clinical Impression(s) / ED Diagnoses Final diagnoses:  Allergic reaction, initial encounter    Rx / DC Orders ED Discharge Orders  Ordered    predniSONE (DELTASONE) 10 MG tablet  Daily         05/07/21 1203    EPINEPHrine 0.3 mg/0.3 mL IJ SOAJ injection  As needed        05/07/21 1203           Valarie Merino, MD 05/07/21 1241

## 2021-05-07 NOTE — ED Triage Notes (Signed)
Pt comes from work, [employer] via EMS. Pt reports that she has an allergy to rubber and latex. She reports that someone placed a pair of shoes on the counter in front of her causing her to develop hives and respiratory difficulty. Pt then took her home EPI and 100 mg of Benadryl PO.  Pt is in no distress at this time. EMS reports stable transport with no complaints.  BP 132/76 HR 60 O2 97% RA RR 17 Pt a/o x4

## 2021-05-07 NOTE — Discharge Instructions (Signed)
Return for any problem.  ?

## 2021-05-09 ENCOUNTER — Telehealth: Payer: Self-pay

## 2021-05-09 NOTE — Telephone Encounter (Signed)
Left message to see how pt is doing after recent ER visit for allergic reaction.

## 2021-06-05 ENCOUNTER — Other Ambulatory Visit: Payer: Self-pay

## 2021-06-05 ENCOUNTER — Encounter: Payer: Self-pay | Admitting: Internal Medicine

## 2021-06-05 ENCOUNTER — Ambulatory Visit (INDEPENDENT_AMBULATORY_CARE_PROVIDER_SITE_OTHER): Payer: Medicare Other | Admitting: Internal Medicine

## 2021-06-05 DIAGNOSIS — F320 Major depressive disorder, single episode, mild: Secondary | ICD-10-CM | POA: Diagnosis not present

## 2021-06-05 DIAGNOSIS — I872 Venous insufficiency (chronic) (peripheral): Secondary | ICD-10-CM | POA: Diagnosis not present

## 2021-06-05 NOTE — Assessment & Plan Note (Signed)
This is worse I suspect a component of lymphedema Recommended she go back to vascular surgeon

## 2021-06-05 NOTE — Progress Notes (Signed)
Subjective:    Patient ID: [name], female    DOB: 1961-11-13, 60 y.o.   MRN: [id number]  HPI Here for follow up of depression--mom is with her This visit occurred during the SARS-CoV-2 public health emergency.  Safety protocols were in place, including screening questions prior to the visit, additional usage of staff PPE, and extensive cleaning of exam room while observing appropriate contact time as indicated for disinfecting solutions.   Her mood is better with the addition of the bupropion Continues on the fluoxetine One of her stressors--coworker--is gone  Still working till 6/23 Then has 5 weeks off  Current Outpatient Medications on File Prior to Visit  Medication Sig Dispense Refill   buPROPion (WELLBUTRIN SR) 150 MG 12 hr tablet Take 1 tablet (150 mg total) by mouth 2 (two) times daily. 60 tablet 3   cyanocobalamin (,VITAMIN B-12,) 1000 MCG/ML injection INJECT 1 ML (1,000 MCG TOTAL) INTO THE MUSCLE EVERY 30 (THIRTY) DAYS. 3 mL 13   cyclobenzaprine (FLEXERIL) 10 MG tablet TAKE 2 TABLETS (20 MG TOTAL) BY MOUTH AT BEDTIME. 60 tablet 5   diphenhydramine-acetaminophen (TYLENOL PM) 25-500 MG TABS tablet Take 2 tablets by mouth at bedtime.     EPINEPHrine 0.3 mg/0.3 mL IJ SOAJ injection Inject 0.3 mg into the muscle as needed for anaphylaxis. 1 each 0   ergocalciferol (VITAMIN D2) 1.25 MG (50000 UT) capsule Take 1 capsule (50,000 Units total) by mouth 2 (two) times a week. 30 capsule 1   estradiol (ESTRACE) 0.1 MG/GM vaginal cream Place 1 Applicatorful vaginally 3 (three) times a week. 42.5 g 12   FLUoxetine (PROZAC) 20 MG capsule Take 2 capsules (40 mg total) by mouth daily. On odd days, 20mg  on even days. 322 capsule 3   folic acid (FOLVITE) 1 MG tablet TAKE 1 TABLET BY MOUTH EVERY DAY 90 tablet 3   hydrocortisone (ANUSOL-HC) 2.5 % rectal cream Place 1 application rectally at bedtime. 30 g 1   levothyroxine (SYNTHROID) 112 MCG tablet Take 2 tablets (224 mcg total) by mouth  daily before breakfast. 180 tablet 3   methylcellulose oral powder Take 1 packet by mouth daily as needed (constipation).      Multiple Vitamin (MULTIVITAMIN WITH MINERALS) TABS tablet Take 1 tablet by mouth at bedtime.     Needles & Syringes MISC 1 Syringe by Does not apply route every 30 (thirty) days. 12 Syringe 3   omeprazole (PRILOSEC) 20 MG capsule Take 1 capsule (20 mg total) by mouth daily. (Patient taking differently: Take 20 mg by mouth at bedtime.) 90 capsule 3   prochlorperazine (COMPAZINE) 5 MG tablet Take 1 tablet (5 mg total) by mouth every 6 (six) hours as needed for nausea or vomiting. 30 tablet 0   No current facility-administered medications on file prior to visit.    Allergies  Allergen Reactions   Ambien [Zolpidem Tartrate]     Black outs   Food Anaphylaxis, Diarrhea and Nausea And Vomiting    Bananas-anaphylactic Kiwi-anaphylactic Mushroom--nausea/vomiting Beer- anaphylactic   Latex Other (See Comments)    REACTION: "anaphylactic shock"   Ramelteon Other (See Comments)    REACTION: blacks out ([place])   Tetracyclines & Related Anaphylaxis and Other (See Comments)    Syncope     Past Medical History:  Diagnosis Date   Allergy    Anemia    Anxiety    Chronic venous insufficiency    Depression    Family history of adverse reaction to anesthesia  GERD (gastroesophageal reflux disease)    Greenfield filter in place    History of DVT (deep vein thrombosis)    Hypothyroidism    Thyroid cancer ([place]) dx'd 2008   surg only    Past Surgical History:  Procedure Laterality Date   ABDOMINAL HYSTERECTOMY     BALLOON DILATION N/A 12/08/2018   Procedure: BALLOON DILATION;  Surgeon: Milus Banister, MD;  Location: [facility];  Service: Endoscopy;  Laterality: N/A;   BIOPSY THYROID  01/03   suggestive of [place]   COLONOSCOPY WITH PROPOFOL N/A 12/08/2018   Procedure: COLONOSCOPY WITH PROPOFOL;  Surgeon:  Milus Banister, MD;  Location: [facility];  Service: Endoscopy;  Laterality: N/A;   CRANIECTOMY SUBOCCIPITAL W/ CERVICAL LAMINECTOMY / CHIARI  05/2010   repair, Dr.Pool   DILATION AND CURETTAGE OF UTERUS  2003   ESOPHAGOGASTRODUODENOSCOPY (EGD) WITH PROPOFOL N/A 12/08/2018   Procedure: ESOPHAGOGASTRODUODENOSCOPY (EGD) WITH PROPOFOL;  Surgeon: Milus Banister, MD;  Location: [facility];  Service: Endoscopy;  Laterality: N/A;   FETAL SURGERY FOR CONGENITAL HERNIA     GASTRIC BYPASS     POLYPECTOMY  12/08/2018   Procedure: POLYPECTOMY;  Surgeon: Milus Banister, MD;  Location: [facility];  Service: Endoscopy;;   THYROIDECTOMY  2008   left   TONSILLECTOMY  1965   TUBAL LIGATION  1998   VEIN LIGATION  1995/1998   right leg    Family History  Problem Relation Age of Onset   Arthritis Mother    Coronary artery disease Father    Kidney cancer Father    Lung cancer Father    Coronary artery disease Sister    Ovarian cancer Maternal Grandmother    Thyroid disease Neg Hx    Colon cancer Neg Hx    Esophageal cancer Neg Hx    Pancreatic cancer Neg Hx    Stomach cancer Neg Hx     Social History   Socioeconomic History   Marital status: Divorced    Spouse name: Not on file   Number of children: 3   Years of education: Not on file   Highest education level: Not on file  Occupational History   Occupation: Part time Environmental manager  Tobacco Use   Smoking status: Never   Smokeless tobacco: Never  Substance and Sexual Activity   Alcohol use: Yes    Alcohol/week: 0.0 standard drinks    Comment: rare   Drug use: No   Sexual activity: Not on file  Other Topics Concern   Not on file  Social History Narrative   No living will   Requests mother to make health care decision--alternate would sons Marylyn Ishihara or Alroy Dust   Would accept resuscitation attempts   No tube feeds if cognitively unaware         Social Determinants of Health   Financial Resource Strain: Not on file   Food Insecurity: Not on file  Transportation Needs: Not on file  Physical Activity: Not on file  Stress: Not on file  Social Connections: Not on file  Intimate Partner Violence: Not on file   Review of Systems Not sleeping well--but nothing new Still uses the tylenol PM--trying to cut down on this Appetite is fine     Objective:   Physical Exam Constitutional:      Appearance: Normal appearance.  Neurological:     Mental Status: She is alert.  Psychiatric:  Mood and Affect: Mood normal.        Behavior: Behavior normal.           Assessment & Plan:

## 2021-06-05 NOTE — Assessment & Plan Note (Signed)
Doing better now  Continuing on fluoxetine and bid bupropion Would not wean at this point

## 2021-06-06 ENCOUNTER — Other Ambulatory Visit: Payer: Self-pay | Admitting: Internal Medicine

## 2021-06-16 ENCOUNTER — Encounter: Payer: Self-pay | Admitting: Cardiovascular Disease

## 2021-06-16 ENCOUNTER — Telehealth: Payer: Self-pay

## 2021-06-16 ENCOUNTER — Other Ambulatory Visit: Payer: Self-pay

## 2021-06-16 ENCOUNTER — Ambulatory Visit (INDEPENDENT_AMBULATORY_CARE_PROVIDER_SITE_OTHER): Payer: Medicare Other | Admitting: Cardiovascular Disease

## 2021-06-16 VITALS — BP 116/7 | HR 59 | Ht 65.0 in | Wt 336.0 lb

## 2021-06-16 DIAGNOSIS — R002 Palpitations: Secondary | ICD-10-CM | POA: Diagnosis not present

## 2021-06-16 DIAGNOSIS — I872 Venous insufficiency (chronic) (peripheral): Secondary | ICD-10-CM

## 2021-06-16 DIAGNOSIS — I5032 Chronic diastolic (congestive) heart failure: Secondary | ICD-10-CM | POA: Diagnosis not present

## 2021-06-16 NOTE — Patient Instructions (Signed)
Medication Instructions:  Your physician recommends that you continue on your current medications as directed. Please refer to the Current Medication list given to you today.  *If you need a refill on your cardiac medications before your next appointment, please call your pharmacy*   Lab Work: None If you have labs (blood work) drawn today and your tests are completely normal, you will receive your results only by: [place] (if you have MyChart) OR A paper copy in the mail If you have any lab test that is abnormal or we need to change your treatment, we will call you to review the results.   Testing/Procedures: None   Follow-Up: At [facility], you and your health needs are our priority.  As part of our continuing mission to provide you with exceptional heart care, we have created designated Provider Care Teams.  These Care Teams include your primary Cardiologist (physician) and Advanced Practice Providers (APPs -  Physician Assistants and Nurse Practitioners) who all work together to provide you with the care you need, when you need it.  We recommend signing up for the patient portal called "MyChart".  Sign up information is provided on this After Visit Summary.  MyChart is used to connect with patients for Virtual Visits (Telemedicine).  Patients are able to view lab/test results, encounter notes, upcoming appointments, etc.  Non-urgent messages can be sent to your provider as well.   To learn more about what you can do with MyChart, go to [email or web address].    Your next appointment:   1 year(s)  The format for your next appointment:   In Person  Provider:   You may see Lauree Chandler, MD or one of the following Advanced Practice Providers on your designated Care Team:   Melina Copa, PA-C Ermalinda Barrios, PA-C   Other Instructions

## 2021-06-16 NOTE — Telephone Encounter (Signed)
Patient calls today to report bilateral leg swelling. She was just seen by cardiology and they recommend she follow up with [facility] - she was last seen in 2018 for venous insufficiency. She is having swelling in both legs, left is worse than right. Says there is some burning and pressure. Placed on schedule for PA vein visit and bil le venous reflux study.

## 2021-06-16 NOTE — Progress Notes (Signed)
Chief Complaint  Patient presents with   Follow-up    Chronic venous stasis    History of Present Illness: 60 yo female with history of DVT, chronic venous insufficiency and hypothyroidism who is here today for follow up. 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 She had been on coumadin but this was stopped in 2008 by Dr. Albertine Patricia because she was non-compliant and there was no evidence of hypercoagulable state. She has had a negative workup in the hematology clinic. She has also been seen in the past by Dr. Eilleen Kempf in [facility] for vein stripping but she did not have that procedure here in [place]. She had prior vein stripping in [place]. Echo March 2019 with LVEF=55-60%, no valve disease. Venous dopplers April 2018 with no DVT. She has an IVC filter in place. 21 day event monitor December 2013 did not show any evidence of PACs, PVCs, SVT or atrial fibrillation.   She is here today for follow up. The patient denies any chest pain, dyspnea, palpitations, lower extremity edema, orthopnea, PND, dizziness, near syncope or syncope. She has burning type pain in her legs.   Primary Care Physician: [name], MD  Past Medical History:  Diagnosis Date   Allergy    Anemia    Anxiety    Chronic venous insufficiency    Depression    Family history of adverse reaction to anesthesia    GERD (gastroesophageal reflux disease)    Greenfield filter in place    History of DVT (deep vein thrombosis)    Hypothyroidism    Thyroid cancer ([place]) dx'd 2008   surg only    Past Surgical History:  Procedure Laterality Date   ABDOMINAL HYSTERECTOMY     BALLOON DILATION N/A 12/08/2018   Procedure: BALLOON DILATION;  Surgeon: Milus Banister, MD;  [facility]: [facility];  Service: Endoscopy;  Laterality: N/A;   BIOPSY THYROID  01/03   suggestive of [place]   COLONOSCOPY WITH PROPOFOL N/A 12/08/2018   Procedure: COLONOSCOPY WITH PROPOFOL;  Surgeon: Milus Banister, MD;  [facility]: [facility];  Service: Endoscopy;  Laterality: N/A;   CRANIECTOMY SUBOCCIPITAL W/ CERVICAL LAMINECTOMY / CHIARI  05/2010   repair, Dr.Pool   DILATION AND CURETTAGE OF UTERUS  2003   ESOPHAGOGASTRODUODENOSCOPY (EGD) WITH PROPOFOL N/A 12/08/2018   Procedure: ESOPHAGOGASTRODUODENOSCOPY (EGD) WITH PROPOFOL;  Surgeon: Milus Banister, MD;  [facility];  Service: Endoscopy;  Laterality: N/A;   FETAL SURGERY FOR CONGENITAL HERNIA     GASTRIC BYPASS     POLYPECTOMY  12/08/2018   Procedure: POLYPECTOMY;  Surgeon: Milus Banister, MD;  [facility];  Service: Endoscopy;;   THYROIDECTOMY  2008   left   TONSILLECTOMY  1965   TUBAL LIGATION  1998   VEIN LIGATION  1995/1998   right leg    Current Outpatient Medications  Medication Sig Dispense Refill   buPROPion (WELLBUTRIN SR) 150 MG 12 hr tablet TAKE 1 TABLET BY MOUTH TWICE A DAY 180 tablet 3   cyanocobalamin (,VITAMIN B-12,) 1000 MCG/ML injection INJECT 1 ML (1,000 MCG TOTAL) INTO THE MUSCLE EVERY 30 (THIRTY) DAYS. 3 mL 13   cyclobenzaprine (FLEXERIL) 10 MG tablet TAKE 2 TABLETS (20 MG TOTAL) BY MOUTH AT BEDTIME. 60 tablet 5   diphenhydramine-acetaminophen (TYLENOL PM) 25-500 MG TABS  tablet Take 2 tablets by mouth at bedtime.     EPINEPHrine 0.3 mg/0.3 mL IJ SOAJ injection Inject 0.3 mg into the muscle as needed for anaphylaxis. 1 each 0   ergocalciferol (VITAMIN D2) 1.25 MG (50000 UT) capsule Take 1 capsule (50,000 Units total) by mouth 2 (two) times a week. 30 capsule 1   estradiol (ESTRACE) 0.1 MG/GM vaginal cream Place 1 Applicatorful vaginally 3 (three) times a week. 42.5 g 12   FLUoxetine (PROZAC) 20 MG capsule Take 2 capsules (40 mg total) by mouth daily. On odd days, 20mg  on even days. 341 capsule 3   folic acid (FOLVITE) 1 MG tablet TAKE 1 TABLET BY MOUTH EVERY DAY 90  tablet 3   hydrocortisone (ANUSOL-HC) 2.5 % rectal cream Place 1 application rectally at bedtime. 30 g 1   levothyroxine (SYNTHROID) 112 MCG tablet Take 2 tablets (224 mcg total) by mouth daily before breakfast. 180 tablet 3   methylcellulose oral powder Take 1 packet by mouth daily as needed (constipation).      Multiple Vitamin (MULTIVITAMIN WITH MINERALS) TABS tablet Take 1 tablet by mouth at bedtime.     Needles & Syringes MISC 1 Syringe by Does not apply route every 30 (thirty) days. 12 Syringe 3   omeprazole (PRILOSEC) 20 MG capsule Take 20 mg by mouth as needed.     prochlorperazine (COMPAZINE) 5 MG tablet Take 1 tablet (5 mg total) by mouth every 6 (six) hours as needed for nausea or vomiting. 30 tablet 0   No current facility-administered medications for this visit.    Allergies  Allergen Reactions   Ambien [Zolpidem Tartrate]     Black outs   Food Anaphylaxis, Diarrhea and Nausea And Vomiting    Bananas-anaphylactic Kiwi-anaphylactic Mushroom--nausea/vomiting Beer- anaphylactic   Latex Other (See Comments)    REACTION: "anaphylactic shock"   Ramelteon Other (See Comments)    REACTION: blacks out ([place])   Tetracyclines & Related Anaphylaxis and Other (See Comments)    Syncope     Social History   Socioeconomic History   Marital status: Divorced    Spouse name: Not on file   Number of children: 3   Years of education: Not on file   Highest education level: Not on file  Occupational History   Occupation: Part time Environmental manager  Tobacco Use   Smoking status: Never   Smokeless tobacco: Never  Substance and Sexual Activity   Alcohol use: Yes    Alcohol/week: 0.0 standard drinks    Comment: rare   Drug use: No   Sexual activity: Not on file  Other Topics Concern   Not on file  Social History Narrative   No living will   Requests mother to make health care decision--alternate would sons Marylyn Ishihara or Alroy Dust   Would accept resuscitation attempts   No tube  feeds if cognitively unaware         Social Determinants of Health   Financial Resource Strain: Not on file  Food Insecurity: Not on file  Transportation Needs: Not on file  Physical Activity: Not on file  Stress: Not on file  Social Connections: Not on file  Intimate Partner Violence: Not on file    Family History  Problem Relation Age of Onset   Arthritis Mother    Coronary artery disease Father    Kidney cancer Father    Lung cancer Father    Coronary artery disease Sister    Ovarian cancer Maternal Grandmother    Thyroid  disease Neg Hx    Colon cancer Neg Hx    Esophageal cancer Neg Hx    Pancreatic cancer Neg Hx    Stomach cancer Neg Hx     Review of Systems:  As stated in the HPI and otherwise negative.   BP (!) 116/7   Pulse (!) 59   Ht 5\' 5"  (1.651 m)   Wt (!) 336 lb (152.4 kg)   SpO2 97%   BMI 55.91 kg/m   Physical Examination: General: Well developed, well nourished, NAD  HEENT: OP clear, mucus membranes moist  SKIN: warm, dry. No rashes. Neuro: No focal deficits  Musculoskeletal: Muscle strength 5/5 all ext  Psychiatric: Mood and affect normal  Neck: No JVD, no carotid bruits, no thyromegaly, no lymphadenopathy.  Lungs:Clear bilaterally, no wheezes, rhonci, crackles Cardiovascular: Regular rate and rhythm. No murmurs, gallops or rubs. Abdomen:Soft. Bowel sounds present. Non-tender.  Extremities: No lower extremity edema. Pulses are 2 + in the bilateral DP/PT.  EKG:  EKG is  ordered today. The ekg ordered today demonstrates Sinus  Recent Labs: 02/17/2021: Hemoglobin 12.5; Platelet Count 201 04/08/2021: ALT 31; BUN 9; Creatinine, Ser 0.69; Potassium 4.0; Sodium 142; TSH 57.35   Lipid Panel    Component Value Date/Time   CHOL 244 (A) 05/03/2018 0000   TRIG 141 05/03/2018 0000   HDL 56 05/03/2018 0000   CHOLHDL 4 05/18/2014 0804   VLDL 14.0 05/18/2014 0804   LDLCALC 160 05/03/2018 0000     Wt Readings from Last 3 Encounters:  06/16/21 (!)  336 lb (152.4 kg)  06/05/21 (!) 341 lb (154.7 kg)  04/08/21 (!) 345 lb (156.5 kg)     Other studies Reviewed: Additional studies/ records that were reviewed today include: . Review of the above records demonstrates:    Assessment and Plan:   1. Palpitations: Rare palpitations.   2. Chronic diastolic CHF: No volume overload on exam. LV systolic function normal by echo in 2019.   3. LE edema/history of DVT: Venous dopplers April 2018 with no DVT. IVC filter in place since 2004. She has been off of coumadin for years. Followed in [facility]   4. Morbid obesity: We discussed weight loss   Current medicines are reviewed at length with the patient today.  The patient does not have concerns regarding medicines.  The following changes have been made:  no change  Labs/ tests ordered today include:   Orders Placed This Encounter  Procedures   EKG 12-Lead     Disposition:   FU with me in 12  months  Signed, Lauree Chandler, MD 06/16/2021 11:12 AM    [address], [place], [place]  [address] Phone: [phone]; Fax: [phone]

## 2021-06-20 MED ORDER — FLUOXETINE HCL 40 MG PO CAPS: 40.0000 mg | ORAL_CAPSULE | Freq: Every day | ORAL | 3 refills | Status: AC

## 2021-07-10 ENCOUNTER — Ambulatory Visit (HOSPITAL_COMMUNITY)
Admission: RE | Admit: 2021-07-10 | Discharge: 2021-07-10 | Disposition: A | Discharge status: Home / Self Care | Payer: Medicare Other | Source: Ambulatory Visit | Attending: Vascular Surgery | Admitting: Vascular Surgery

## 2021-07-10 ENCOUNTER — Other Ambulatory Visit: Payer: Self-pay

## 2021-07-10 ENCOUNTER — Ambulatory Visit (INDEPENDENT_AMBULATORY_CARE_PROVIDER_SITE_OTHER): Payer: Medicare Other | Admitting: Physician Assistant

## 2021-07-10 VITALS — BP 120/82 | HR 64 | Temp 97.7°F | Resp 20 | Ht 65.0 in | Wt 332.8 lb

## 2021-07-10 DIAGNOSIS — I872 Venous insufficiency (chronic) (peripheral): Secondary | ICD-10-CM

## 2021-07-10 NOTE — Progress Notes (Signed)
Requested by:  [name], MD [place],  [place] [id number]  Reason for consultation: bilateral lower extremity reflux and varicose veins   History of Present Illness   [name] is a 60 y.o. ([birth year]) female who presents for evaluation of bilateral lower extremity swelling left > swelling and varicose veins. She was seen by Dr. Donnetta Hutching in 2018 for the same concerns. She has long complex history of venous pathology with prior DVT in right leg after venous ligation and DVT in L leg after gastric bypass surgery. She has a IVC filter. She has had chronic thrombus on duplex in her left popliteal vein since 2013.  She explains that the swelling and discomfort has worsened in the left leg. She also has been getting cramps in back of her right thigh intermittently that are extremely painful. She elevates regularly at night. She has a wedge pillow. Although with her morbid obesity she is not able to get them really elevated.  She reports minimal improvement with this. She has worn compression in the past but has severe allergy to latex, rubber, silicone, etc so she cannot tolerate them. She also has compression pumps but she has not used these recently.   Venous symptoms include: aching,throbbing, swelling, cramping Onset/duration:  > 5 years Occupation:  disabled Aggravating factors: sitting, standing, walking Alleviating factors: elevation Compression:  yes Helps:  cant tolerate Pain medications:  Flexeril Previous vein procedures:  RLE venous ablation History of DVT:  yes  Past Medical History:  Diagnosis Date   Allergy    Anemia    Anxiety    Chronic venous insufficiency    Depression    Family history of adverse reaction to anesthesia    GERD (gastroesophageal reflux disease)    Greenfield filter in place    History of DVT (deep vein thrombosis)    Hypothyroidism    Thyroid cancer ([place]) dx'd 2008   surg only    Past Surgical History:   Procedure Laterality Date   ABDOMINAL HYSTERECTOMY     BALLOON DILATION N/A 12/08/2018   Procedure: BALLOON DILATION;  Surgeon: Milus Banister, MD;  [facility];  Service: Endoscopy;  Laterality: N/A;   BIOPSY THYROID  01/03   suggestive of [place]   COLONOSCOPY WITH PROPOFOL N/A 12/08/2018   Procedure: COLONOSCOPY WITH PROPOFOL;  Surgeon: Milus Banister, MD;  [facility];  Service: Endoscopy;  Laterality: N/A;   CRANIECTOMY SUBOCCIPITAL W/ CERVICAL LAMINECTOMY / CHIARI  05/2010   repair, Dr.Pool   DILATION AND CURETTAGE OF UTERUS  2003   ESOPHAGOGASTRODUODENOSCOPY (EGD) WITH PROPOFOL N/A 12/08/2018   Procedure: ESOPHAGOGASTRODUODENOSCOPY (EGD) WITH PROPOFOL;  Surgeon: Milus Banister, MD;  [facility];  Service: Endoscopy;  Laterality: N/A;   FETAL SURGERY FOR CONGENITAL HERNIA     GASTRIC BYPASS     POLYPECTOMY  12/08/2018   Procedure: POLYPECTOMY;  Surgeon: Milus Banister, MD;  [facility];  Service: Endoscopy;;   THYROIDECTOMY  2008   left   TONSILLECTOMY  1965   TUBAL LIGATION  1998   VEIN LIGATION  1995/1998   right leg    Social History   Socioeconomic History   Marital status: Divorced    Spouse name: Not on file   Number of children: 3   Years of education: Not on file   Highest education level: Not on file  Occupational History  Occupation: Part time Environmental manager  Tobacco Use   Smoking status: Never   Smokeless tobacco: Never  Substance and Sexual Activity   Alcohol use: Yes    Alcohol/week: 0.0 standard drinks    Comment: rare   Drug use: No   Sexual activity: Not on file  Other Topics Concern   Not on file  Social History Narrative   No living will   Requests mother to make health care decision--alternate would sons Marylyn Ishihara or Alroy Dust   Would accept resuscitation attempts   No tube feeds if cognitively unaware         Social Determinants of  Health   Financial Resource Strain: Not on file  Food Insecurity: Not on file  Transportation Needs: Not on file  Physical Activity: Not on file  Stress: Not on file  Social Connections: Not on file  Intimate Partner Violence: Not on file    Family History  Problem Relation Age of Onset   Arthritis Mother    Coronary artery disease Father    Kidney cancer Father    Lung cancer Father    Coronary artery disease Sister    Ovarian cancer Maternal Grandmother    Thyroid disease Neg Hx    Colon cancer Neg Hx    Esophageal cancer Neg Hx    Pancreatic cancer Neg Hx    Stomach cancer Neg Hx     Current Outpatient Medications  Medication Sig Dispense Refill   buPROPion (WELLBUTRIN SR) 150 MG 12 hr tablet TAKE 1 TABLET BY MOUTH TWICE A DAY 180 tablet 3   cyanocobalamin (,VITAMIN B-12,) 1000 MCG/ML injection INJECT 1 ML (1,000 MCG TOTAL) INTO THE MUSCLE EVERY 30 (THIRTY) DAYS. 3 mL 13   cyclobenzaprine (FLEXERIL) 10 MG tablet TAKE 2 TABLETS (20 MG TOTAL) BY MOUTH AT BEDTIME. 60 tablet 5   diphenhydramine-acetaminophen (TYLENOL PM) 25-500 MG TABS tablet Take 2 tablets by mouth at bedtime.     EPINEPHrine 0.3 mg/0.3 mL IJ SOAJ injection Inject 0.3 mg into the muscle as needed for anaphylaxis. 1 each 0   ergocalciferol (VITAMIN D2) 1.25 MG (50000 UT) capsule Take 1 capsule (50,000 Units total) by mouth 2 (two) times a week. 30 capsule 1   estradiol (ESTRACE) 0.1 MG/GM vaginal cream Place 1 Applicatorful vaginally 3 (three) times a week. 42.5 g 12   FLUoxetine (PROZAC) 40 MG capsule Take 1 capsule (40 mg total) by mouth daily. 90 capsule 3   folic acid (FOLVITE) 1 MG tablet TAKE 1 TABLET BY MOUTH EVERY DAY 90 tablet 3   hydrocortisone (ANUSOL-HC) 2.5 % rectal cream Place 1 application rectally at bedtime. 30 g 1   levothyroxine (SYNTHROID) 112 MCG tablet Take 2 tablets (224 mcg total) by mouth daily before breakfast. 180 tablet 3   methylcellulose oral powder Take 1 packet by mouth daily as  needed (constipation).      Multiple Vitamin (MULTIVITAMIN WITH MINERALS) TABS tablet Take 1 tablet by mouth at bedtime.     Needles & Syringes MISC 1 Syringe by Does not apply route every 30 (thirty) days. 12 Syringe 3   omeprazole (PRILOSEC) 20 MG capsule Take 20 mg by mouth as needed.     prochlorperazine (COMPAZINE) 5 MG tablet Take 1 tablet (5 mg total) by mouth every 6 (six) hours as needed for nausea or vomiting. 30 tablet 0   No current facility-administered medications for this visit.    Allergies  Allergen Reactions   Ambien [Zolpidem Tartrate]  Black outs   Food Anaphylaxis, Diarrhea and Nausea And Vomiting    Bananas-anaphylactic Kiwi-anaphylactic Mushroom--nausea/vomiting Beer- anaphylactic   Latex Other (See Comments)    REACTION: "anaphylactic shock"   Ramelteon Other (See Comments)    REACTION: blacks out ([place])   Tetracyclines & Related Anaphylaxis and Other (See Comments)    Syncope     REVIEW OF SYSTEMS (negative unless checked):   Cardiac:  []  Chest pain or chest pressure? []  Shortness of breath upon activity? []  Shortness of breath when lying flat? []  Irregular heart rhythm?  Vascular:  []  Pain in calf, thigh, or hip brought on by walking? []  Pain in feet at night that wakes you up from your sleep? []  Blood clot in your veins? [x]  Leg swelling?  Pulmonary:  []  Oxygen at home? []  Productive cough? []  Wheezing?  Neurologic:  []  Sudden weakness in arms or legs? []  Sudden numbness in arms or legs? []  Sudden onset of difficult speaking or slurred speech? []  Temporary loss of vision in one eye? []  Problems with dizziness?  Gastrointestinal:  []  Blood in stool? []  Vomited blood?  Genitourinary:  []  Burning when urinating? []  Blood in urine?  Psychiatric:  []  Major depression  Hematologic:  []  Bleeding problems? []  Problems with blood clotting?  Dermatologic:  []  Rashes or ulcers?  Constitutional:  []  Fever or  chills?  Ear/Nose/Throat:  []  Change in hearing? []  Nose bleeds? []  Sore throat?  Musculoskeletal:  []  Back pain? []  Joint pain? []  Muscle pain?   Physical Examination     Vitals:   07/10/21 0946  BP: 120/82  Pulse: 64  Resp: 20  Temp: 97.7 F (36.5 C)  TempSrc: Temporal  SpO2: 97%  Weight: (!) 332 lb 12.8 oz (151 kg)  Height: 5\' 5"  (1.651 m)   Body mass index is 55.38 kg/m.  General:  morbidly obese, NAD; vital signs documented above Gait: Normal HENT: WNL, normocephalic Pulmonary: normal non-labored breathing , without wheezing Cardiac: regular HR, without  Murmurs without carotid bruit Abdomen: obese, soft Vascular Exam/Pulses:2+ radial pulses bilaterally, 2+ Dp and Pt pulses bilaterally. Feet warm and well perfused Extremities: with varicose veins, with reticular veins, with edema, without stasis pigmentation, without lipodermatosclerosis, without ulcers   Musculoskeletal: no muscle wasting or atrophy  Neurologic: A&O X 3;  No focal weakness or paresthesias are detected Psychiatric:  The pt has Normal affect.  Non-invasive Vascular Imaging   BLE Venous Insufficiency Duplex (07/10/21):  RLE:  DVT and SVT No GSV reflux (prior ablation) GSV diameter < 4 No SSV reflux No deep venous reflux AASV with reflux  LLE: No DVT and SVT GSV reflux SFJ, mid thigh, knee GSV diameter >0.7  No SSV reflux  FV, popliteal deep venous reflux Chronic thrombus in popliteal vein AASV no reflux   Medical Decision Making   CANDUS BRAUD is a 60 y.o. female who presents with: BLE chronic venous insufficiency, with varicose veins with complications. Duplex shows very similar findings to her 2018 studies with no SVT or DVT. She does have chronic popliteal thrombus on LLE that has been present since 2013. She has no deep or superficial reflux in RLE. Her left has extensive reflux in both superficial and deep venous systems. I did explain the potential improvement with  ablation of her great saphenous vein. I feel that she would have minimal improvement with ablation due to extent of deep reflux but also expressed again concern to her of increased risk with her history of extensive  DVT as well as some chronic thrombus in deep system that if we closed off her superficial veins she potentially could have obstructed outflow in her leg.  I have really encouraged her to continue elevation when possible. I have recommended revisiting her compression pumps. Recommend also that she look into compression wraps as option and hopefully they would not give her similar reaction as other compression garments. Discussed importance of refraining from prolonged standing and sitting. Discussed weight loss also as another helpful tool. She will follow up as needed   Karoline Caldwell, PA-C [facility]: [phone]  07/10/2021, 9:55 AM  Clinic MD: [place]

## 2021-08-11 ENCOUNTER — Ambulatory Visit
Admission: RE | Admit: 2021-08-11 | Discharge: 2021-08-11 | Disposition: A | Discharge status: Home / Self Care | Payer: Medicare Other | Source: Ambulatory Visit | Attending: Internal Medicine | Admitting: Internal Medicine

## 2021-08-11 ENCOUNTER — Other Ambulatory Visit: Payer: Self-pay

## 2021-08-11 DIAGNOSIS — Z1231 Encounter for screening mammogram for malignant neoplasm of breast: Secondary | ICD-10-CM | POA: Diagnosis not present

## 2021-09-01 DIAGNOSIS — Z9884 Bariatric surgery status: Secondary | ICD-10-CM | POA: Diagnosis not present

## 2021-09-01 DIAGNOSIS — K219 Gastro-esophageal reflux disease without esophagitis: Secondary | ICD-10-CM | POA: Diagnosis not present

## 2021-09-01 DIAGNOSIS — E46 Unspecified protein-calorie malnutrition: Secondary | ICD-10-CM | POA: Diagnosis not present

## 2021-09-01 DIAGNOSIS — K449 Diaphragmatic hernia without obstruction or gangrene: Secondary | ICD-10-CM | POA: Diagnosis not present

## 2021-09-01 DIAGNOSIS — R635 Abnormal weight gain: Secondary | ICD-10-CM | POA: Diagnosis not present

## 2021-09-24 DIAGNOSIS — K449 Diaphragmatic hernia without obstruction or gangrene: Secondary | ICD-10-CM | POA: Diagnosis not present

## 2021-09-24 DIAGNOSIS — R635 Abnormal weight gain: Secondary | ICD-10-CM | POA: Diagnosis not present

## 2021-09-24 DIAGNOSIS — K219 Gastro-esophageal reflux disease without esophagitis: Secondary | ICD-10-CM | POA: Diagnosis not present

## 2021-09-24 DIAGNOSIS — Z9884 Bariatric surgery status: Secondary | ICD-10-CM | POA: Diagnosis not present

## 2021-09-24 DIAGNOSIS — E46 Unspecified protein-calorie malnutrition: Secondary | ICD-10-CM | POA: Diagnosis not present

## 2021-10-17 DIAGNOSIS — Z9884 Bariatric surgery status: Secondary | ICD-10-CM | POA: Diagnosis not present

## 2021-10-17 DIAGNOSIS — E46 Unspecified protein-calorie malnutrition: Secondary | ICD-10-CM | POA: Diagnosis not present

## 2021-10-17 DIAGNOSIS — K208 Other esophagitis without bleeding: Secondary | ICD-10-CM | POA: Diagnosis not present

## 2021-10-17 DIAGNOSIS — K2100 Gastro-esophageal reflux disease with esophagitis, without bleeding: Secondary | ICD-10-CM | POA: Diagnosis not present

## 2021-10-17 DIAGNOSIS — K209 Esophagitis, unspecified without bleeding: Secondary | ICD-10-CM | POA: Diagnosis not present

## 2021-10-29 DIAGNOSIS — Z23 Encounter for immunization: Secondary | ICD-10-CM | POA: Diagnosis not present

## 2021-12-17 ENCOUNTER — Other Ambulatory Visit: Payer: Self-pay | Admitting: Internal Medicine

## 2022-02-17 ENCOUNTER — Other Ambulatory Visit: Payer: Medicare Other

## 2022-02-17 ENCOUNTER — Ambulatory Visit: Payer: Medicare Other | Admitting: Hematology and Oncology

## 2022-06-05 ENCOUNTER — Ambulatory Visit: Payer: Medicare Other | Admitting: Internal Medicine

## 2022-12-08 ENCOUNTER — Telehealth: Payer: Self-pay | Admitting: Internal Medicine

## 2022-12-08 NOTE — Telephone Encounter (Signed)
PAPERWORK/FORMS received  Dropped off by: Ryanne Call back #: [phone] Individual made aware of 3-5 business day turn around (YES/NO): YES GREEN charge sheet completed and patient made aware of possible charge (YES/NO): YES Placed in provider folder at front desk. ~~~ route to CMA/provider Team  CLINICAL USE BELOW THIS LINE (use X to signify action taken)  ___ Form received and placed in providers office for signature. ___ Form completed and faxed to LOA Dept.  ___ Form completed & LVM to notify patient ready for pick up.  ___ Charge sheet and copy of form in front office folder for office supervisor.

## 2022-12-08 NOTE — Telephone Encounter (Signed)
Pt requested a call back once form is completed

## 2022-12-09 NOTE — Telephone Encounter (Signed)
It is for a handicap placard. Form in Dr Alla German inbox to sign when he returns.

## 2022-12-12 NOTE — Telephone Encounter (Signed)
This is appropriate I will sign it on Tuesday

## 2022-12-23 NOTE — Telephone Encounter (Signed)
Pt called in to know status of handicap placard paperwork . Please advise [phone]

## 2023-08-13 ENCOUNTER — Encounter (HOSPITAL_COMMUNITY): Payer: Self-pay

## 2023-08-13 ENCOUNTER — Emergency Department (HOSPITAL_COMMUNITY): Payer: Self-pay

## 2023-08-13 ENCOUNTER — Other Ambulatory Visit: Payer: Self-pay

## 2023-08-13 ENCOUNTER — Emergency Department (HOSPITAL_COMMUNITY)
Admission: EM | Admit: 2023-08-13 | Discharge: 2023-08-13 | Disposition: A | Discharge status: Home / Self Care | Payer: Self-pay | Attending: Emergency Medicine | Admitting: Emergency Medicine

## 2023-08-13 DIAGNOSIS — Z20822 Contact with and (suspected) exposure to covid-19: Secondary | ICD-10-CM | POA: Insufficient documentation

## 2023-08-13 DIAGNOSIS — E039 Hypothyroidism, unspecified: Secondary | ICD-10-CM | POA: Insufficient documentation

## 2023-08-13 DIAGNOSIS — E876 Hypokalemia: Secondary | ICD-10-CM | POA: Insufficient documentation

## 2023-08-13 DIAGNOSIS — Z79899 Other long term (current) drug therapy: Secondary | ICD-10-CM | POA: Insufficient documentation

## 2023-08-13 DIAGNOSIS — Z85850 Personal history of malignant neoplasm of thyroid: Secondary | ICD-10-CM | POA: Insufficient documentation

## 2023-08-13 DIAGNOSIS — Z91040 Latex allergy status: Secondary | ICD-10-CM | POA: Insufficient documentation

## 2023-08-13 DIAGNOSIS — N3001 Acute cystitis with hematuria: Secondary | ICD-10-CM | POA: Insufficient documentation

## 2023-08-13 DIAGNOSIS — R531 Weakness: Secondary | ICD-10-CM | POA: Insufficient documentation

## 2023-08-13 LAB — URINALYSIS, ROUTINE W REFLEX MICROSCOPIC
Bilirubin Urine: NEGATIVE
Glucose, UA: NEGATIVE mg/dL
Ketones, ur: NEGATIVE mg/dL
Nitrite: NEGATIVE
Protein, ur: 100 mg/dL — AB
RBC / HPF: >50 RBC/hpf (ref 0–5)
Specific Gravity, Urine: 1.02 (ref 1.005–1.030)
WBC, UA: >50 WBC/hpf (ref 0–5)
pH: 5 (ref 5.0–8.0)

## 2023-08-13 LAB — CBC
HCT: 39.7 % (ref 36.0–46.0)
Hemoglobin: 13.1 g/dL (ref 12.0–15.0)
MCH: 32.2 pg (ref 26.0–34.0)
MCHC: 33 g/dL (ref 30.0–36.0)
MCV: 97.5 fL (ref 80.0–100.0)
Platelets: 177 10*3/uL (ref 150–400)
RBC: 4.07 MIL/uL (ref 3.87–5.11)
RDW: 14 % (ref 11.5–15.5)
WBC: 11.9 10*3/uL — ABNORMAL HIGH (ref 4.0–10.5)
nRBC: 0 % (ref 0.0–0.2)

## 2023-08-13 LAB — BASIC METABOLIC PANEL
Anion gap: 10 (ref 5–15)
BUN: 10 mg/dL (ref 8–23)
CO2: 21 mmol/L — ABNORMAL LOW (ref 22–32)
Calcium: 7.7 mg/dL — ABNORMAL LOW (ref 8.9–10.3)
Chloride: 102 mmol/L (ref 98–111)
Creatinine, Ser: 0.8 mg/dL (ref 0.44–1.00)
GFR, Estimated: >60 mL/min (ref >60)
Glucose, Bld: 125 mg/dL — ABNORMAL HIGH (ref 70–99)
Potassium: 2.8 mmol/L — ABNORMAL LOW (ref 3.5–5.1)
Sodium: 133 mmol/L — ABNORMAL LOW (ref 135–145)

## 2023-08-13 MED ORDER — POTASSIUM CHLORIDE CRYS ER 20 MEQ PO TBCR: 20.0000 meq | EXTENDED_RELEASE_TABLET | Freq: Every day | ORAL | 0 refills | Status: AC

## 2023-08-13 MED ORDER — KETOROLAC TROMETHAMINE 15 MG/ML IJ SOLN
15.0000 mg | Freq: Once | INTRAMUSCULAR | Status: AC
  Administered 2023-08-13: 15 mg via INTRAVENOUS
  Filled 2023-08-13: qty 1

## 2023-08-13 MED ORDER — SODIUM CHLORIDE 0.9 % IV BOLUS
1000.0000 mL | Freq: Once | INTRAVENOUS | Status: AC
  Administered 2023-08-13: 1000 mL via INTRAVENOUS

## 2023-08-13 MED ORDER — ONDANSETRON 4 MG PO TBDP: 4.0000 mg | ORAL_TABLET | Freq: Three times a day (TID) | ORAL | 0 refills | Status: AC | PRN

## 2023-08-13 MED ORDER — ONDANSETRON HCL 4 MG/2ML IJ SOLN
4.0000 mg | Freq: Once | INTRAMUSCULAR | Status: AC
  Administered 2023-08-13: 4 mg via INTRAVENOUS
  Filled 2023-08-13: qty 2

## 2023-08-13 MED ORDER — SODIUM CHLORIDE 0.9 % IV SOLN
1.0000 g | Freq: Once | INTRAVENOUS | Status: AC
  Administered 2023-08-13: 1 g via INTRAVENOUS
  Filled 2023-08-13: qty 10

## 2023-08-13 MED ORDER — CEPHALEXIN 250 MG PO CAPS: 250.0000 mg | ORAL_CAPSULE | Freq: Four times a day (QID) | ORAL | 0 refills | Status: AC

## 2023-08-13 MED ORDER — POTASSIUM CHLORIDE CRYS ER 20 MEQ PO TBCR
40.0000 meq | EXTENDED_RELEASE_TABLET | Freq: Once | ORAL | Status: AC
  Administered 2023-08-13: 40 meq via ORAL
  Filled 2023-08-13: qty 4

## 2023-08-13 MED ORDER — ACETAMINOPHEN 500 MG PO TABS
1000.0000 mg | ORAL_TABLET | Freq: Once | ORAL | Status: AC
  Administered 2023-08-13: 1000 mg via ORAL
  Filled 2023-08-13: qty 2

## 2023-08-13 NOTE — ED Provider Notes (Signed)
[place] EMERGENCY DEPARTMENT AT [facility] Provider Note   CSN: [id number] Arrival date & time: 08/13/23  1641     History  Chief Complaint  Patient presents with   Fatigue   Decreased appetite.     [name] is a 62 y.o. female with PMH as listed below who presents BIBEMS, pt is coming from home. Pt has had decreased appetite for the last 5x days, complaining of weakness and malaise. As well as chills/shakes. Pt states that she has not had anything to eat since Wednesday, but has been drinking Gatorade. Endorses urinary frequency/urgency. Denies dysuria/hematuria but states that it was orange colored. Denies any cough, URI symptoms, chest pain, SOB, abdominal pain. Endorses some DOE with walking to the bathroom, has a pulse ox at home and it has stayed at 92% but last night it dropped to 82% but it was in the middle of the night. Does have h/o DVT s/p vein ligation. Does not take a blood thinner. Endorses baseline leg swelling in the left leg w/ history of the DVT but also has IVC filter in place.  Also endorses headache. Not worse with bending over or lying down. Also has h/o gastric bypass surgery, spina bifida w/ chiari malformation/hydrocephalus.   Past Medical History:  Diagnosis Date   Allergy    Anemia    Anxiety    Chronic venous insufficiency    Depression    Family history of adverse reaction to anesthesia    GERD (gastroesophageal reflux disease)    Greenfield filter in place    History of DVT (deep vein thrombosis)    Hypothyroidism    Thyroid cancer (HCC) dx'd 2008   surg only       Home Medications Prior to Admission medications   Medication Sig Start Date End Date Taking? Authorizing Provider  cephALEXin (KEFLEX) 250 MG capsule Take 1 capsule (250 mg total) by mouth 4 (four) times daily for 5 days. 08/13/23 08/18/23 Yes Loetta Rough, MD  ondansetron (ZOFRAN-ODT) 4 MG disintegrating tablet Take 1 tablet (4 mg total) by mouth every 8  (eight) hours as needed. 08/13/23  Yes Loetta Rough, MD  potassium chloride SA (KLOR-CON M) 20 MEQ tablet Take 1 tablet (20 mEq total) by mouth daily for 2 days. 08/13/23 08/15/23 Yes Loetta Rough, MD  buPROPion (WELLBUTRIN SR) 150 MG 12 hr tablet TAKE 1 TABLET BY MOUTH TWICE A DAY 06/06/21   Tillman Abide I, MD  cyanocobalamin (,VITAMIN B-12,) 1000 MCG/ML injection INJECT 1 ML (1,000 MCG TOTAL) INTO THE MUSCLE EVERY 30 (THIRTY) DAYS. 02/05/21   Serena Croissant, MD  cyclobenzaprine (FLEXERIL) 10 MG tablet TAKE 2 TABLETS (20 MG TOTAL) BY MOUTH AT BEDTIME. 02/03/20   Karie Schwalbe, MD  diphenhydramine-acetaminophen (TYLENOL PM) 25-500 MG TABS tablet Take 2 tablets by mouth at bedtime.    [provider]  EPINEPHrine 0.3 mg/0.3 mL IJ SOAJ injection Inject 0.3 mg into the muscle as needed for anaphylaxis. 05/07/21   Wynetta Fines, MD  ergocalciferol (VITAMIN D2) 1.25 MG (50000 UT) capsule Take 1 capsule (50,000 Units total) by mouth 2 (two) times a week. 02/22/20   Romero Belling, MD  estradiol (ESTRACE) 0.1 MG/GM vaginal cream Place 1 Applicatorful vaginally 3 (three) times a week. 11/06/20   Karie Schwalbe, MD  FLUoxetine (PROZAC) 40 MG capsule Take 1 capsule (40 mg total) by mouth daily. 06/20/21   Karie Schwalbe, MD  folic acid (FOLVITE) 1 MG tablet  TAKE 1 TABLET BY MOUTH EVERY DAY 10/09/19   Serena Croissant, MD  hydrocortisone (ANUSOL-HC) 2.5 % rectal cream Place 1 application rectally at bedtime. 04/17/20   Meredith Pel, NP  levothyroxine (SYNTHROID) 112 MCG tablet Take 2 tablets (224 mcg total) by mouth daily before breakfast. 04/08/21   Karie Schwalbe, MD  methylcellulose oral powder Take 1 packet by mouth daily as needed (constipation).     [provider]  Multiple Vitamin (MULTIVITAMIN WITH MINERALS) TABS tablet Take 1 tablet by mouth at bedtime.    [provider]  Needles & Syringes MISC 1 Syringe by Does not apply route every 30 (thirty) days. 01/30/20    Serena Croissant, MD  omeprazole (PRILOSEC) 20 MG capsule Take 20 mg by mouth as needed.    [provider]  prochlorperazine (COMPAZINE) 5 MG tablet Take 1 tablet (5 mg total) by mouth every 6 (six) hours as needed for nausea or vomiting. 07/04/18   Ceasar Mons., PA-C      Allergies    Ambien [zolpidem tartrate], Food, Latex, Ramelteon, and Tetracyclines & related    Review of Systems   Review of Systems A 10 point review of systems was performed and is negative unless otherwise reported in HPI.  Physical Exam Updated Vital Signs BP 103/64   Pulse 70   Temp 99.5 F (37.5 C) (Oral)   Resp 17   Ht 5\' 3"  (1.6 m)   Wt (!) 163.3 kg   SpO2 93%   BMI 63.77 kg/m  Physical Exam General: Normal appearing obese female, lying in bed.  HEENT: PERRLA, EOMI, Sclera anicteric, MMM, trachea midline. Neck supple.  Cardiology: RRR, no murmurs/rubs/gallops. BL radial and DP pulses equal bilaterally.  Resp: Normal respiratory rate and effort. CTAB, no wheezes, rhonchi, crackles.  Abd: Soft, non-tender, non-distended. No rebound tenderness or guarding.  GU: Deferred. MSK: No peripheral edema or signs of trauma. Extremities without deformity or TTP. No cyanosis or clubbing. Skin: warm, dry. Back: No CVA tenderness Neuro: A&Ox4, CNs II-XII grossly intact. MAEs. Sensation grossly intact.  Psych: Normal mood and affect.   ED Results / Procedures / Treatments   Labs (all labs ordered are listed, but only abnormal results are displayed) Labs Reviewed  BASIC METABOLIC PANEL - Abnormal; Notable for the following components:      Result Value   Sodium 133 (*)    Potassium 2.8 (*)    CO2 21 (*)    Glucose, Bld 125 (*)    Calcium 7.7 (*)    All other components within normal limits  CBC - Abnormal; Notable for the following components:   WBC 11.9 (*)    All other components within normal limits  URINALYSIS, ROUTINE W REFLEX MICROSCOPIC - Abnormal; Notable for the following components:    Color, Urine AMBER (*)    APPearance CLOUDY (*)    Hgb urine dipstick MODERATE (*)    Protein, ur 100 (*)    Leukocytes,Ua LARGE (*)    Bacteria, UA MANY (*)    All other components within normal limits  SARS CORONAVIRUS 2 BY RT PCR  URINE CULTURE    EKG None  Radiology CT Head Wo Contrast  Result Date: 08/13/2023 CLINICAL DATA:  Increased frequency and severity of headaches. Decreased appetite. EXAM: CT HEAD WITHOUT CONTRAST TECHNIQUE: Contiguous axial images were obtained from the base of the skull through the vertex without intravenous contrast. RADIATION DOSE REDUCTION: This exam was performed according to the departmental dose-optimization  program which includes automated exposure control, adjustment of the mA and/or kV according to patient size and/or use of iterative reconstruction technique. COMPARISON:  Head CT dated 01/21/2011. FINDINGS: Brain: The ventricles and sulci are appropriate size for the patient's age. The gray-white matter discrimination is preserved. There is no acute intracranial hemorrhage. No mass effect or midline shift. Incidental note of a partially empty sella. Interval decrease in the suboccipital fluid collection/pseudomeningocele or surgical resection. Vascular: No hyperdense vessel or unexpected calcification. Skull: Normal. Negative for fracture or focal lesion. Sinuses/Orbits: No acute finding. Other: None IMPRESSION: 1. No acute intracranial pathology. 2. Interval decrease in the suboccipital fluid collection/pseudomeningocele or surgical resection. Electronically Signed   By: Elgie Collard M.D.   On: 08/13/2023 21:31   DG Chest Portable 1 View  Result Date: 08/13/2023 CLINICAL DATA:  Dyspnea on exertion.  Fatigue.  Decreased appetite. EXAM: PORTABLE CHEST 1 VIEW COMPARISON:  Chest two views 09/22/2018 FINDINGS: Cardiac silhouette is again mildly enlarged. Mediastinal contours are within limits. Mildly decreased lung volumes. Mild bilateral chronic  interstitial thickening is unchanged from prior. No pleural effusion pneumothorax. No acute skeletal abnormality. IMPRESSION: 1. No acute cardiopulmonary disease. 2. Mild cardiomegaly. Electronically Signed   By: Neita Garnet M.D.   On: 08/13/2023 21:20    Procedures Procedures    Medications Ordered in ED Medications  sodium chloride 0.9 % bolus 1,000 mL (1,000 mLs Intravenous New Bag/Given 08/13/23 1951)  cefTRIAXone (ROCEPHIN) 1 g in sodium chloride 0.9 % 100 mL IVPB (1 g Intravenous New Bag/Given 08/13/23 1952)  potassium chloride SA (KLOR-CON M) CR tablet 40 mEq (40 mEq Oral Given 08/13/23 1941)  ondansetron (ZOFRAN) injection 4 mg (4 mg Intravenous Given 08/13/23 2036)  acetaminophen (TYLENOL) tablet 1,000 mg (1,000 mg Oral Given 08/13/23 2214)  ketorolac (TORADOL) 15 MG/ML injection 15 mg (15 mg Intravenous Given 08/13/23 2214)    ED Course/ Medical Decision Making/ A&P                          Medical Decision Making Amount and/or Complexity of Data Reviewed Labs: ordered. Decision-making details documented in ED Course. Radiology: ordered. Decision-making details documented in ED Course.  Risk OTC drugs. Prescription drug management.    This patient presents to the ED for concern of generalized weakness, urinary sxs, this involves an extensive number of treatment options, and is a complaint that carries with it a high risk of complications and morbidity.  I considered the following differential and admission for this acute, potentially life threatening condition.   MDM:    With generalized weakness, chills and malaise, consider infection.  Her temperature here is 99.5 Fahrenheit.  She does endorse urinary symptoms and consider UTI.  No flank pain or abdominal pain to indicate pyelonephritis or urolithiasis or other intra-abdominal infection such as appendicitis/diverticulitis.  She has no upper respiratory symptoms, but with report of O2 sat in 82%, consider possible causes such  as PNA, bronchitis and will get a CXR. She has h/o DVT but has IVC filter. No CP but does endorse dizziness. She has not had any hypoxia, tachypnea, tachycardia while here in the ED and w/ her IVC filter I have low concern for PE. She endorses a headache but has no neurologic symptoms and states it started with her other symptoms - with history of hydrocephalus/chiari malformation, will get CTH to eval for hydrocephalus. She has no focal neurologic deficits, no meningismus to indicate ICH, CVA, or meningitis.  Labs do demonstrate hypokalemia likely due to decreased p.o. intake and will replete orally.  She does not have any anemia  Clinical Course as of 08/13/23 2328  Fri Aug 13, 2023  1906 Urinalysis, Routine w reflex microscopic -Urine, Clean Catch(!) +UTI.  Review of microdata reveals 1 positive urine culture for pansensitive E. coli in 2021.  Will treat with IV ceftriaxone. [HN]  1906 WBC(!): 11.9 +leukocytosis [HN]  1906 Potassium(!): 2.8 +hypokalemia, likely d/t decreased PO intake. Will replete. [HN]  2217 CT Head Wo Contrast 1. No acute intracranial pathology. 2. Interval decrease in the suboccipital fluid collection/pseudomeningocele or surgical resection.   [HN]  2217 DG Chest Portable 1 View 1. No acute cardiopulmonary disease. 2. Mild cardiomegaly.   [HN]  2322 Patient reevaluated. Her headache has improved after Tylenol, Toradol, Zofran.  She has not had any tachypnea, hypoxia while here in the ED.  It is possible that her sat was low in the middle the night due to sleep apnea, however she does not have any hypoxia or shortness of breath, respiratory distress here.  I considered admission for this patient but I believe that she is safe for discharge.  She does not have any nausea or vomiting and has taken p.o. in the ED.  Patient will be discharged with Keflex, Zofran ODT, and 2 days of potassium supplementation.  She is instructed to follow-up with her primary care physician and is  given specific discharge instructions and return precautions.  All questions answered to patient satisfaction. [HN]    Clinical Course User Index [HN] Loetta Rough, MD    Labs: I Ordered, and personally interpreted labs.  The pertinent results include:  those listed above  Imaging Studies ordered: I ordered imaging studies including CTH, CXR I independently visualized and interpreted imaging. I agree with the radiologist interpretation  Additional history obtained from chart review.   Cardiac Monitoring: The patient was maintained on a cardiac monitor.  I personally viewed and interpreted the cardiac monitored which showed an underlying rhythm of: NSR  Reevaluation: After the interventions noted above, I reevaluated the patient and found that they have :improved  Social Determinants of Health: Lives independently  Disposition:  DC  Co morbidities that complicate the patient evaluation  Past Medical History:  Diagnosis Date   Allergy    Anemia    Anxiety    Chronic venous insufficiency    Depression    Family history of adverse reaction to anesthesia    GERD (gastroesophageal reflux disease)    Greenfield filter in place    History of DVT (deep vein thrombosis)    Hypothyroidism    Thyroid cancer (HCC) dx'd 2008   surg only     Medicines Meds ordered this encounter  Medications   sodium chloride 0.9 % bolus 1,000 mL   cefTRIAXone (ROCEPHIN) 1 g in sodium chloride 0.9 % 100 mL IVPB    Order Specific Question:   Antibiotic Indication:    Answer:   UTI   potassium chloride SA (KLOR-CON M) CR tablet 40 mEq   ondansetron (ZOFRAN) injection 4 mg   acetaminophen (TYLENOL) tablet 1,000 mg   ketorolac (TORADOL) 15 MG/ML injection 15 mg   cephALEXin (KEFLEX) 250 MG capsule    Sig: Take 1 capsule (250 mg total) by mouth 4 (four) times daily for 5 days.    Dispense:  20 capsule    Refill:  0   ondansetron (ZOFRAN-ODT) 4 MG disintegrating tablet    Sig: Take  1 tablet  (4 mg total) by mouth every 8 (eight) hours as needed.    Dispense:  20 tablet    Refill:  0   potassium chloride SA (KLOR-CON M) 20 MEQ tablet    Sig: Take 1 tablet (20 mEq total) by mouth daily for 2 days.    Dispense:  2 tablet    Refill:  0    I have reviewed the patients home medicines and have made adjustments as needed  Problem List / ED Course: Problem List Items Addressed This Visit   None Visit Diagnoses     Acute cystitis with hematuria    -  Primary   Hypokalemia                       This note was created using dictation software, which may contain spelling or grammatical errors.    Loetta Rough, MD 08/13/23 [id number]

## 2023-08-13 NOTE — Discharge Instructions (Addendum)
Thank you for coming to [facility] Emergency Department. You were seen for generalized weakness, fever. We did an exam, labs, and imaging, and these showed a urinary tract infection and low potassium.  We have prescribed antibiotic called Keflex to take 200 mg 4 times per day for 5 days as well as Zofran under the tongue 4 mg every 8 hours as needed for nausea vomiting.  Your potassium was likely low because you are not eating very well at home right now.  Please try to eat and drink well and we have prescribed potassium 1 tablet each day for 2 days. Please follow up with your primary care provider within 1 week.   Do not hesitate to return to the ED or call 911 if you experience: -Worsening symptoms -Nausea/vomiting so severe you cannot eat, drink, or take your antibiotics -Lightheadedness, passing out -Fevers/chills -Anything else that concerns you

## 2023-08-13 NOTE — ED Triage Notes (Signed)
Per EMS, pt is coming from home. Pt has had decreased appetite for the last 5x days, complaining of weakness and malaise. Pt states that she has not had anything to eat since Wednesday, but has been drinking Gatorade.

## 2023-08-14 LAB — SARS CORONAVIRUS 2 BY RT PCR: SARS Coronavirus 2 by RT PCR: NEGATIVE

## 2023-08-16 LAB — URINE CULTURE: Culture: >=100000 — AB

## 2023-08-17 ENCOUNTER — Telehealth (HOSPITAL_BASED_OUTPATIENT_CLINIC_OR_DEPARTMENT_OTHER): Payer: Self-pay | Admitting: *Deleted

## 2023-08-17 NOTE — Telephone Encounter (Signed)
Post ED Visit - Positive Culture Follow-up  Culture report reviewed by antimicrobial stewardship pharmacist: [facility] []  Enzo Bi, Pharm.D. []  Celedonio Miyamoto, [address].D., BCPS AQ-ID []  Garvin Fila, Pharm.D., BCPS []  Georgina Pillion, Pharm.D., BCPS []  [place], [address].D., BCPS, AAHIVP []  Estella Husk, Pharm.D., BCPS, AAHIVP []  Lysle Pearl, PharmD, BCPS []  Phillips Climes, PharmD, BCPS []  Agapito Games, PharmD, BCPS []  Verlan Friends, PharmD []  Mervyn Gay, PharmD, BCPS []  Vinnie Level, PharmD  [facility] [x]  Nicole Kindred PharmD []  Greer Pickerel, PharmD []  Adalberto Cole, PharmD []  Perlie Gold, Rph []  Lonell Face) Jean Rosenthal, PharmD []  Earl Many, PharmD []  Junita Push, PharmD []  Dorna Leitz, PharmD []  Terrilee Files, PharmD []  Lynann Beaver, PharmD []  Keturah Barre, PharmD []  Loralee Pacas, PharmD []  Bernadene Person, PharmD   Positive urine culture Treated with Cephalexin, organism sensitive to the same and no further patient follow-up is required at this time.  Bing Quarry 08/17/2023, 8:50 AM

## 2023-11-08 ENCOUNTER — Encounter: Payer: Self-pay | Admitting: Cardiovascular Disease

## 2023-11-08 ENCOUNTER — Encounter: Payer: Self-pay | Admitting: Internal Medicine
